# Patient Record
Sex: Female | Born: 1950 | Race: White | Hispanic: No | Marital: Married | Smoking: Never smoker
Health system: Southern US, Community
[De-identification: ages and names within clinical notes are randomized; demographics above are authoritative.]

## PROBLEM LIST (undated history)

## (undated) DIAGNOSIS — H269 Unspecified cataract: Secondary | ICD-10-CM

## (undated) DIAGNOSIS — C50919 Malignant neoplasm of unspecified site of unspecified female breast: Secondary | ICD-10-CM

## (undated) DIAGNOSIS — C801 Malignant (primary) neoplasm, unspecified: Secondary | ICD-10-CM

## (undated) DIAGNOSIS — E785 Hyperlipidemia, unspecified: Secondary | ICD-10-CM

## (undated) DIAGNOSIS — K5792 Diverticulitis of intestine, part unspecified, without perforation or abscess without bleeding: Secondary | ICD-10-CM

## (undated) DIAGNOSIS — I499 Cardiac arrhythmia, unspecified: Secondary | ICD-10-CM

## (undated) DIAGNOSIS — R0600 Dyspnea, unspecified: Secondary | ICD-10-CM

## (undated) DIAGNOSIS — R011 Cardiac murmur, unspecified: Secondary | ICD-10-CM

## (undated) HISTORY — PX: INCONTINENCE SURGERY: SHX676

## (undated) HISTORY — DX: Cardiac arrhythmia, unspecified: I49.9

## (undated) HISTORY — PX: EYE SURGERY: SHX253

## (undated) HISTORY — DX: Unspecified cataract: H26.9

## (undated) HISTORY — PX: TONSILLECTOMY: SUR1361

## (undated) HISTORY — PX: COLON RESECTION SIGMOID: SHX6737

## (undated) HISTORY — DX: Diverticulitis of intestine, part unspecified, without perforation or abscess without bleeding: K57.92

## (undated) HISTORY — DX: Cardiac murmur, unspecified: R01.1

## (undated) HISTORY — PX: ABDOMINAL HYSTERECTOMY: SHX81

## (undated) HISTORY — DX: Malignant (primary) neoplasm, unspecified: C80.1

## (undated) HISTORY — PX: COLON SURGERY: SHX602

## (undated) HISTORY — DX: Malignant neoplasm of unspecified site of unspecified female breast: C50.919

## (undated) HISTORY — DX: Hyperlipidemia, unspecified: E78.5

---

## 2011-05-01 ENCOUNTER — Emergency Department (INDEPENDENT_AMBULATORY_CARE_PROVIDER_SITE_OTHER)
Admission: EM | Admit: 2011-05-01 | Discharge: 2011-05-01 | Disposition: A | Discharge status: Home / Self Care | Payer: BC Managed Care – PPO | Source: Home / Self Care | Attending: Emergency Medicine | Admitting: Emergency Medicine

## 2011-05-01 ENCOUNTER — Encounter (HOSPITAL_COMMUNITY): Payer: Self-pay | Admitting: Emergency Medicine

## 2011-05-01 DIAGNOSIS — N390 Urinary tract infection, site not specified: Secondary | ICD-10-CM

## 2011-05-01 LAB — POCT URINALYSIS DIP (DEVICE)
Bilirubin Urine: NEGATIVE
Glucose, UA: NEGATIVE mg/dL
Nitrite: NEGATIVE
Protein, ur: 30 mg/dL — AB
Specific Gravity, Urine: 1.025 (ref 1.005–1.030)
Urobilinogen, UA: 0.2 mg/dL (ref 0.0–1.0)
pH: 5.5 (ref 5.0–8.0)

## 2011-05-01 MED ORDER — PHENAZOPYRIDINE HCL 200 MG PO TABS: 200.0000 mg | ORAL_TABLET | Freq: Three times a day (TID) | ORAL | Status: AC | PRN

## 2011-05-01 MED ORDER — SULFAMETHOXAZOLE-TRIMETHOPRIM 800-160 MG PO TABS: 1.0000 | ORAL_TABLET | Freq: Two times a day (BID) | ORAL | Status: AC

## 2011-05-01 NOTE — ED Notes (Signed)
2 day hx of urinary difficulty

## 2011-05-01 NOTE — ED Provider Notes (Signed)
History     CSN: [id number]  Arrival date & time 05/01/11  1955   First MD Initiated Contact with Patient 05/01/11 1958      Chief Complaint  Patient presents with  . Dysuria    (Consider location/radiation/quality/duration/timing/severity/associated sxs/prior treatment) Patient is a 60 y.o. female presenting with dysuria. The history is provided by the patient.  Dysuria  This is a new problem. The current episode started 2 days ago. The problem occurs every urination. The problem has not changed since onset.The quality of the pain is described as burning and aching. There has been no fever. She is not sexually active. There is no history of pyelonephritis. Associated symptoms include nausea, frequency and urgency. Pertinent negatives include no chills, no sweats, no vomiting, no discharge, no hematuria, no hesitancy and no flank pain. She has tried increased fluids (cranberry pills) for the symptoms. Her past medical history is significant for recurrent UTIs. Her past medical history does not include kidney stones.    Past Medical History  Diagnosis Date  . Ulcerative colitis   . Osteoporosis     Past Surgical History  Procedure Date  . Tonsillectomy   . Incontinence surgery     History reviewed. No pertinent family history.  History  Substance Use Topics  . Smoking status: Never Smoker   . Smokeless tobacco: Not on file  . Alcohol Use: No    OB History    Grav Para Term Preterm Abortions TAB SAB Ect Mult Living                  Review of Systems  Constitutional: Negative for chills.  Gastrointestinal: Positive for nausea. Negative for vomiting.  Genitourinary: Positive for dysuria, urgency and frequency. Negative for hesitancy, hematuria and flank pain.    Allergies  Penicillins  Home Medications   Current Outpatient Rx  Name Route Sig Dispense Refill  . BONIVA IV Intravenous Inject into the vein.      Marland Kitchen MESALAMINE 400 MG PO TBEC Oral Take 400 mg by  mouth 3 (three) times daily.      Marland Kitchen SOLIFENACIN SUCCINATE 10 MG PO TABS Oral Take 5 mg by mouth daily.      Marland Kitchen PHENAZOPYRIDINE HCL 200 MG PO TABS Oral Take 1 tablet (200 mg total) by mouth 3 (three) times daily as needed for pain. 6 tablet 0  . SULFAMETHOXAZOLE-TRIMETHOPRIM 800-160 MG PO TABS Oral Take 1 tablet by mouth 2 (two) times daily. 6 tablet 0    BP 126/72  Pulse 70  Temp(Src) 98.5 F (36.9 C) (Oral)  Resp 18  SpO2 100%  Physical Exam  Nursing note and vitals reviewed. Constitutional: She is oriented to person, place, and time. She appears well-developed and well-nourished. No distress.  HENT:  Head: Normocephalic and atraumatic.  Eyes: EOM are normal. Pupils are equal, round, and reactive to light.  Neck: Normal range of motion.  Cardiovascular: Regular rhythm.   Pulmonary/Chest: Effort normal and breath sounds normal.  Abdominal: Soft. Bowel sounds are normal. She exhibits no distension. There is tenderness in the suprapubic area. There is no rigidity, no rebound, no guarding and no CVA tenderness.  Musculoskeletal: Normal range of motion.  Neurological: She is alert and oriented to person, place, and time.  Skin: Skin is warm and dry.  Psychiatric: She has a normal mood and affect. Her behavior is normal. Judgment and thought content normal.    ED Course  Procedures (including critical care time)  Labs Reviewed  POCT URINALYSIS DIP (DEVICE) - Abnormal; Notable for the following:    Ketones, ur TRACE (*)    Hgb urine dipstick MODERATE (*)    Protein, ur 30 (*)    Leukocytes, UA MODERATE (*) Biochemical Testing Only. Please order routine urinalysis from main lab if confirmatory testing is needed.   All other components within normal limits  POCT URINALYSIS DIPSTICK   No results found.   1. UTI (lower urinary tract infection)       MDM    Luiz Blare, MD 05/01/11 2056

## 2011-05-03 NOTE — ED Notes (Signed)
Pt. and her husband called for her urine culture result and asking if she needs more medicine?  I did not see a result. Discussed with Dr. Chaney Malling. She said it was 9:00 PM and she may have forgot to order the test.  She said if pt. is better, no more medicine is needed. If not better, she needs to see her PCP for a repeat U/A and urine culture.  I gave this information to them.  Husband was not happy with this.  I explained that the doctor forgot to order the test as she had discussed with them.  I repeated her instructions.   Vassie Moselle 05/03/2011

## 2015-12-27 DIAGNOSIS — L821 Other seborrheic keratosis: Secondary | ICD-10-CM | POA: Diagnosis not present

## 2015-12-27 DIAGNOSIS — I8310 Varicose veins of unspecified lower extremity with inflammation: Secondary | ICD-10-CM | POA: Diagnosis not present

## 2015-12-27 DIAGNOSIS — Z6829 Body mass index (BMI) 29.0-29.9, adult: Secondary | ICD-10-CM | POA: Diagnosis not present

## 2016-02-01 DIAGNOSIS — Z794 Long term (current) use of insulin: Secondary | ICD-10-CM | POA: Diagnosis not present

## 2016-02-01 DIAGNOSIS — Z6836 Body mass index (BMI) 36.0-36.9, adult: Secondary | ICD-10-CM | POA: Diagnosis not present

## 2016-02-01 DIAGNOSIS — Z1159 Encounter for screening for other viral diseases: Secondary | ICD-10-CM | POA: Diagnosis not present

## 2016-02-01 DIAGNOSIS — E8881 Metabolic syndrome: Secondary | ICD-10-CM | POA: Diagnosis not present

## 2016-02-01 DIAGNOSIS — G5602 Carpal tunnel syndrome, left upper limb: Secondary | ICD-10-CM | POA: Diagnosis not present

## 2016-02-01 DIAGNOSIS — Z125 Encounter for screening for malignant neoplasm of prostate: Secondary | ICD-10-CM | POA: Diagnosis not present

## 2016-02-01 DIAGNOSIS — E785 Hyperlipidemia, unspecified: Secondary | ICD-10-CM | POA: Diagnosis not present

## 2016-02-01 DIAGNOSIS — N182 Chronic kidney disease, stage 2 (mild): Secondary | ICD-10-CM | POA: Diagnosis not present

## 2016-02-01 DIAGNOSIS — I8390 Asymptomatic varicose veins of unspecified lower extremity: Secondary | ICD-10-CM | POA: Diagnosis not present

## 2016-02-01 DIAGNOSIS — E1122 Type 2 diabetes mellitus with diabetic chronic kidney disease: Secondary | ICD-10-CM | POA: Diagnosis not present

## 2016-02-01 DIAGNOSIS — I10 Essential (primary) hypertension: Secondary | ICD-10-CM | POA: Diagnosis not present

## 2016-02-07 DIAGNOSIS — K5190 Ulcerative colitis, unspecified, without complications: Secondary | ICD-10-CM | POA: Diagnosis not present

## 2016-02-25 DIAGNOSIS — J40 Bronchitis, not specified as acute or chronic: Secondary | ICD-10-CM | POA: Diagnosis not present

## 2016-02-25 DIAGNOSIS — Z6829 Body mass index (BMI) 29.0-29.9, adult: Secondary | ICD-10-CM | POA: Diagnosis not present

## 2016-03-03 DIAGNOSIS — K5190 Ulcerative colitis, unspecified, without complications: Secondary | ICD-10-CM | POA: Diagnosis not present

## 2016-03-03 DIAGNOSIS — Z6829 Body mass index (BMI) 29.0-29.9, adult: Secondary | ICD-10-CM | POA: Diagnosis not present

## 2016-03-03 DIAGNOSIS — M810 Age-related osteoporosis without current pathological fracture: Secondary | ICD-10-CM | POA: Diagnosis not present

## 2016-03-03 DIAGNOSIS — Z90710 Acquired absence of both cervix and uterus: Secondary | ICD-10-CM | POA: Diagnosis not present

## 2016-03-03 DIAGNOSIS — Z9089 Acquired absence of other organs: Secondary | ICD-10-CM | POA: Diagnosis not present

## 2016-03-03 DIAGNOSIS — R32 Unspecified urinary incontinence: Secondary | ICD-10-CM | POA: Diagnosis not present

## 2016-03-03 DIAGNOSIS — Z66 Do not resuscitate: Secondary | ICD-10-CM | POA: Diagnosis not present

## 2016-03-03 DIAGNOSIS — Q851 Tuberous sclerosis: Secondary | ICD-10-CM | POA: Diagnosis not present

## 2016-04-12 DIAGNOSIS — M79604 Pain in right leg: Secondary | ICD-10-CM | POA: Diagnosis not present

## 2016-04-12 DIAGNOSIS — R938 Abnormal findings on diagnostic imaging of other specified body structures: Secondary | ICD-10-CM | POA: Diagnosis not present

## 2016-04-13 DIAGNOSIS — Z1231 Encounter for screening mammogram for malignant neoplasm of breast: Secondary | ICD-10-CM | POA: Diagnosis not present

## 2016-11-16 DIAGNOSIS — Z86010 Personal history of colonic polyps: Secondary | ICD-10-CM | POA: Diagnosis not present

## 2016-11-16 DIAGNOSIS — K5100 Ulcerative (chronic) pancolitis without complications: Secondary | ICD-10-CM | POA: Diagnosis not present

## 2017-07-17 ENCOUNTER — Encounter: Payer: Self-pay | Admitting: Family Medicine

## 2017-07-17 ENCOUNTER — Ambulatory Visit: Payer: Medicare HMO | Admitting: Family Medicine

## 2017-07-17 VITALS — BP 120/70 | HR 80 | Temp 98.0°F | Resp 16 | Ht 64.75 in | Wt 180.3 lb

## 2017-07-17 DIAGNOSIS — R198 Other specified symptoms and signs involving the digestive system and abdomen: Secondary | ICD-10-CM

## 2017-07-17 DIAGNOSIS — E669 Obesity, unspecified: Secondary | ICD-10-CM | POA: Diagnosis not present

## 2017-07-17 DIAGNOSIS — Z1239 Encounter for other screening for malignant neoplasm of breast: Secondary | ICD-10-CM

## 2017-07-17 DIAGNOSIS — M810 Age-related osteoporosis without current pathological fracture: Secondary | ICD-10-CM | POA: Diagnosis not present

## 2017-07-17 DIAGNOSIS — R69 Illness, unspecified: Secondary | ICD-10-CM | POA: Diagnosis not present

## 2017-07-17 DIAGNOSIS — Z1159 Encounter for screening for other viral diseases: Secondary | ICD-10-CM | POA: Diagnosis not present

## 2017-07-17 DIAGNOSIS — K51311 Ulcerative (chronic) rectosigmoiditis with rectal bleeding: Secondary | ICD-10-CM

## 2017-07-17 DIAGNOSIS — R32 Unspecified urinary incontinence: Secondary | ICD-10-CM

## 2017-07-17 DIAGNOSIS — Z1231 Encounter for screening mammogram for malignant neoplasm of breast: Secondary | ICD-10-CM | POA: Diagnosis not present

## 2017-07-17 DIAGNOSIS — I8310 Varicose veins of unspecified lower extremity with inflammation: Secondary | ICD-10-CM | POA: Insufficient documentation

## 2017-07-17 DIAGNOSIS — F458 Other somatoform disorders: Secondary | ICD-10-CM | POA: Diagnosis not present

## 2017-07-17 DIAGNOSIS — R0989 Other specified symptoms and signs involving the circulatory and respiratory systems: Secondary | ICD-10-CM

## 2017-07-17 MED ORDER — CLOBETASOL PROPIONATE 0.05 % EX CREA: 1.0000 "application " | TOPICAL_CREAM | Freq: Every day | CUTANEOUS | 5 refills | Status: DC

## 2017-07-17 NOTE — Assessment & Plan Note (Signed)
Diagnosed by dermatologist; will continue the clobetasol

## 2017-07-17 NOTE — Assessment & Plan Note (Signed)
Managed by GI; reviewed last labs from July; stable, chronic condition, continue medicine

## 2017-07-17 NOTE — Assessment & Plan Note (Signed)
Order DEXA; adequate calcium and vitamin D; will consider treatment after seeing scan results; she'll increase activity level

## 2017-07-17 NOTE — Assessment & Plan Note (Signed)
Offered information

## 2017-07-17 NOTE — Patient Instructions (Addendum)
Please request records from prior doctor: immunizations, important imaging studies in the last 5 years, labs from the last 3 years, problem list, and medicine list  Let me know in two weeks if symptoms (throat) persist Try honey and lemon and tea and hydration and salt water gargles  Please do call to schedule your bone density study; the number to schedule one at either [facility] or [facility] is [phone] or [phone]  Check out the information at [email or web address].org entitled "Nutrition for Weight Loss: What You Need to Know about Fad Diets" Try to lose between 1-2 pounds per week by taking in fewer calories and burning off more calories You can succeed by limiting portions, limiting foods dense in calories and fat, becoming more active, and drinking 8 glasses of water a day (64 ounces) Don't skip meals, especially breakfast, as skipping meals may alter your metabolism Do not use over-the-counter weight loss pills or gimmicks that claim rapid weight loss A healthy BMI (or body mass index) is between 18.5 and 24.9 You can calculate your ideal BMI at the [place] website ClubMonetize.fr   Fall Prevention in the Home Falls can cause injuries. They can happen to people of all ages. There are many things you can do to make your home safe and to help prevent falls. What can I do on the outside of my home?  Regularly fix the edges of walkways and driveways and fix any cracks.  Remove anything that might make you trip as you walk through a door, such as a raised step or threshold.  Trim any bushes or trees on the path to your home.  Use bright outdoor lighting.  Clear any walking paths of anything that might make someone trip, such as rocks or tools.  Regularly check to see if handrails are loose or broken. Make sure that both sides of any steps have handrails.  Any raised decks and porches should have  guardrails on the edges.  Have any leaves, snow, or ice cleared regularly.  Use sand or salt on walking paths during winter.  Clean up any spills in your garage right away. This includes oil or grease spills. What can I do in the bathroom?  Use night lights.  Install grab bars by the toilet and in the tub and shower. Do not use towel bars as grab bars.  Use non-skid mats or decals in the tub or shower.  If you need to sit down in the shower, use a plastic, non-slip stool.  Keep the floor dry. Clean up any water that spills on the floor as soon as it happens.  Remove soap buildup in the tub or shower regularly.  Attach bath mats securely with double-sided non-slip rug tape.  Do not have throw rugs and other things on the floor that can make you trip. What can I do in the bedroom?  Use night lights.  Make sure that you have a light by your bed that is easy to reach.  Do not use any sheets or blankets that are too big for your bed. They should not hang down onto the floor.  Have a firm chair that has side arms. You can use this for support while you get dressed.  Do not have throw rugs and other things on the floor that can make you trip. What can I do in the kitchen?  Clean up any spills right away.  Avoid walking on wet floors.  Keep items that you  use a lot in easy-to-reach places.  If you need to reach something above you, use a strong step stool that has a grab bar.  Keep electrical cords out of the way.  Do not use floor polish or wax that makes floors slippery. If you must use wax, use non-skid floor wax.  Do not have throw rugs and other things on the floor that can make you trip. What can I do with my stairs?  Do not leave any items on the stairs.  Make sure that there are handrails on both sides of the stairs and use them. Fix handrails that are broken or loose. Make sure that handrails are as long as the stairways.  Check any carpeting to make sure that  it is firmly attached to the stairs. Fix any carpet that is loose or worn.  Avoid having throw rugs at the top or bottom of the stairs. If you do have throw rugs, attach them to the floor with carpet tape.  Make sure that you have a light switch at the top of the stairs and the bottom of the stairs. If you do not have them, ask someone to add them for you. What else can I do to help prevent falls?  Wear shoes that: ? Do not have high heels. ? Have rubber bottoms. ? Are comfortable and fit you well. ? Are closed at the toe. Do not wear sandals.  If you use a stepladder: ? Make sure that it is fully opened. Do not climb a closed stepladder. ? Make sure that both sides of the stepladder are locked into place. ? Ask someone to hold it for you, if possible.  Clearly mark and make sure that you can see: ? Any grab bars or handrails. ? First and last steps. ? Where the edge of each step is.  Use tools that help you move around (mobility aids) if they are needed. These include: ? Canes. ? Walkers. ? Scooters. ? Crutches.  Turn on the lights when you go into a dark area. Replace any light bulbs as soon as they burn out.  Set up your furniture so you have a clear path. Avoid moving your furniture around.  If any of your floors are uneven, fix them.  If there are any pets around you, be aware of where they are.  Review your medicines with your doctor. Some medicines can make you feel dizzy. This can increase your chance of falling. Ask your doctor what other things that you can do to help prevent falls. This information is not intended to replace advice given to you by your health care provider. Make sure you discuss any questions you have with your health care provider. Document Released: 02/11/2009 Document Revised: 09/23/2015 Document Reviewed: 05/22/2014 Elsevier Interactive Patient Education  2018 [place].  Preventing Unhealthy [employer], Adult Staying at a healthy  weight is important. When fat builds up in your body, you may become overweight or obese. These conditions put you at greater risk for developing certain health problems, such as heart disease, diabetes, sleeping problems, joint problems, and some cancers. Unhealthy weight gain is often the result of making unhealthy choices in what you eat. It is also a result of not getting enough exercise. You can make changes to your lifestyle to prevent obesity and stay as healthy as possible. What nutrition changes can be made? To maintain a healthy weight and prevent obesity:  Eat only as much as your body needs. To  do this: ? Pay attention to signs that you are hungry or full. Stop eating as soon as you feel full. ? If you feel hungry, try drinking water first. Drink enough water so your urine is clear or pale yellow. ? Eat smaller portions. ? Look at serving sizes on food labels. Most foods contain more than one serving per container. ? Eat the recommended amount of calories for your gender and activity level. While most active people should eat around 2,000 calories per day, if you are trying to lose weight or are not very active, you main need to eat less calories. Talk to your health care provider or dietitian about how many calories you should eat each day.  Choose healthy foods, such as: ? Fruits and vegetables. Try to fill at least half of your plate at each meal with fruits and vegetables. ? Whole grains, such as whole wheat bread, brown rice, and quinoa. ? Lean meats, such as chicken or fish. ? Other healthy proteins, such as beans, eggs, or tofu. ? Healthy fats, such as nuts, seeds, fatty fish, and olive oil. ? Low-fat or fat-free dairy.  Check food labels and avoid food and drinks that: ? Are high in calories. ? Have added sugar. ? Are high in sodium. ? Have saturated fats or trans fats.  Limit how much you eat of the following foods: ? Prepackaged meals. ? Fast food. ? Fried  foods. ? Processed meat, such as bacon, sausage, and deli meats. ? Fatty cuts of red meat and poultry with skin.  Cook foods in healthier ways, such as by baking, broiling, or grilling.  When grocery shopping, try to shop around the outside of the store. This helps you buy mostly fresh foods and avoid canned and prepackaged foods.  What lifestyle changes can be made?  Exercise at least 30 minutes 5 or more days each week. Exercising includes brisk walking, yard work, biking, running, swimming, and team sports like basketball and soccer. Ask your health care provider which exercises are safe for you.  Do not use any products that contain nicotine or tobacco, such as cigarettes and e-cigarettes. If you need help quitting, ask your health care provider.  Limit alcohol intake to no more than 1 drink a day for nonpregnant women and 2 drinks a day for men. One drink equals 12 oz of beer, 5 oz of wine, or 1 oz of hard liquor.  Try to get 7-9 hours of sleep each night. What other changes can be made?  Keep a food and activity journal to keep track of: ? What you ate and how many calories you had. Remember to count sauces, dressings, and side dishes. ? Whether you were active, and what exercises you did. ? Your calorie, weight, and activity goals.  Check your weight regularly. Track any changes. If you notice you have gained weight, make changes to your diet or activity routine.  Avoid taking weight-loss medicines or supplements. Talk to your health care provider before starting any new medicine or supplement.  Talk to your health care provider before trying any new diet or exercise plan. Why are these changes important? Eating healthy, staying active, and having healthy habits not only help prevent obesity, they also:  Help you to manage stress and emotions.  Help you to connect with friends and family.  Improve your self-esteem.  Improve your sleep.  Prevent long-term health  problems.  What can happen if changes are not made? Being obese or overweight  can cause you to develop joint or bone problems, which can make it hard for you to stay active or do activities you enjoy. Being obese or overweight also puts stress on your heart and lungs and can lead to health problems like diabetes, heart disease, and some cancers. Where to find more information: Talk with your health care provider or a dietitian about healthy eating and healthy lifestyle choices. You may also find other information through these resources:  U.S. Department of Agriculture MyPlate: FormerBoss.no  American Heart Association: www.heart.org  Centers for Disease Control and Prevention: http://www.wolf.info/  Summary  Staying at a healthy weight is important. It helps prevent certain diseases and health problems, such as heart disease, diabetes, joint problems, sleep disorders, and some cancers.  Being obese or overweight can cause you to develop joint or bone problems, which can make it hard for you to stay active or do activities you enjoy.  You can prevent unhealthy weight gain by eating a healthy diet, exercising regularly, not smoking, limiting alcohol, and getting enough sleep.  Talk with your health care provider or a dietitian for guidance about healthy eating and healthy lifestyle choices. This information is not intended to replace advice given to you by your health care provider. Make sure you discuss any questions you have with your health care provider. Document Released: 04/18/2016 Document Revised: 05/24/2016 Document Reviewed: 05/24/2016 Elsevier Interactive Patient Education  [employer].

## 2017-07-17 NOTE — Progress Notes (Signed)
BP 120/70   Pulse 80   Temp 98 F (36.7 C) (Oral)   Resp 16   Ht 5' 4.75" (1.645 m)   Wt 180 lb 4.8 oz (81.8 kg)   SpO2 97%   BMI 30.24 kg/m    Subjective:    Patient ID: Shirley Lowe, female    DOB: 05-31-1950, 67 y.o.   MRN: [id number]  HPI: Shirley Lowe is a 67 y.o. female  Chief Complaint  Patient presents with  . Establish Care    HPI Patient is here to establish care; from [place] She was sick in January and February; five weeks of cough, and since then has had a lump inside the throat; feels like a something, like sinus drainage, something there; not as bad when sitting up or walking; more noticeable when laying recumbent; not a smoker or smokeless tobacco; little bit of sore throat, but not even then, just a cough, bronchial; bringing up a little bit; always had a lot of sinus drainage; blows her nose and hacks a lot; having a little sinus pressure, but not now; did have a fever and sick in bed for a few days, just did too much; a few days, but resolved; took OTC stuff and that calmed it; chokes a lot now, going on for a little while, even before the sickness; has a small esophagus; she has had food go down the wrong way but can cough it out; feels a little weak all over; not much muscle strength, not an exerciser, no loss of muscle; no loss of weight or night sweats  Osteoporosis; took fosamax first, then Boniva but that's been several years; she took fosamax for about 3 years; does eat a lot of dark green veggies  She does not take the asacol any more; using mesalimine for UC; sees, Dr. Lizbeth Bark at [facility]; labs done in July 2018 looked fabulous; glucose 80  She has discoloration and hardness over the medial left leg; never thought it was cancer; saw dermatologist; had a doppler on both legs, had stress test and other testing; clobetasol is helping; not as red  No high cholesterol; tested in the fall in 2016; has always had good HDL and low  LDL  Does have to wear pads all the time; there is an OTC med called AZO; wondering about taking it for spasms  Depression screen [facility] 2/9 07/17/2017  Decreased Interest 0  Down, Depressed, Hopeless 0  PHQ - 2 Score 0   Relevant past medical, surgical, family and social history reviewed Past Medical History:  Diagnosis Date  . Osteoporosis   . Ulcerative colitis    Past Surgical History:  Procedure Laterality Date  . ABDOMINAL HYSTERECTOMY    . INCONTINENCE SURGERY    . TONSILLECTOMY     Family History  Problem Relation Age of Onset  . Cancer Mother        breast cancer  . Cancer Father        pancreatic  . Hepatitis C Sister   . Thyroid disease Sister   . Irritable bowel syndrome Sister   . Allergies Sister    Social History   Tobacco Use  . Smoking status: Never Smoker  . Smokeless tobacco: Never Used  Substance Use Topics  . Alcohol use: No  . Drug use: No   Interim medical history since last visit reviewed. Allergies and medications reviewed  Review of Systems  Constitutional: Negative for diaphoresis and unexpected weight change.  Gastrointestinal: Negative  for anal bleeding and blood in stool.       No blood in stool, UC is in remission  Musculoskeletal: Negative for arthralgias.   Per HPI unless specifically indicated above     Objective:    BP 120/70   Pulse 80   Temp 98 F (36.7 C) (Oral)   Resp 16   Ht 5' 4.75" (1.645 m)   Wt 180 lb 4.8 oz (81.8 kg)   SpO2 97%   BMI 30.24 kg/m   Wt Readings from Last 3 Encounters:  07/17/17 180 lb 4.8 oz (81.8 kg)    Physical Exam  Constitutional: She appears well-developed and well-nourished. No distress.  HENT:  Head: Normocephalic and atraumatic.  Right Ear: Tympanic membrane and ear canal normal.  Left Ear: Tympanic membrane and ear canal normal.  Nose: No rhinorrhea.  Mouth/Throat: Oropharynx is clear and moist and mucous membranes are normal. No posterior oropharyngeal edema or posterior  oropharyngeal erythema.  Eyes: EOM are normal. No scleral icterus.  Neck: No thyroid mass and no thyromegaly present.  Thyroid mobile and nontender  Cardiovascular: Normal rate, regular rhythm and normal heart sounds.  No murmur heard. Pulmonary/Chest: Effort normal and breath sounds normal. No respiratory distress. She has no wheezes.  Abdominal: Soft. Bowel sounds are normal. She exhibits no distension.  Musculoskeletal: Normal range of motion. She exhibits no edema.       Thoracic back: She exhibits no deformity.  No thoracic deformity  Lymphadenopathy:       Head (right side): No submental and no submandibular adenopathy present.       Head (left side): No submental and no submandibular adenopathy present.    She has no cervical adenopathy.       Right cervical: No posterior cervical adenopathy present.      Left cervical: No posterior cervical adenopathy present.  Neurological: She is alert. She exhibits normal muscle tone.  Skin: She is not diaphoretic. No pallor.     Area on the anteromedial  LEFT leg has erythema and firmness  Psychiatric: She has a normal mood and affect. Her behavior is normal. Judgment and thought content normal. Her mood appears not anxious. She does not exhibit a depressed mood.    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[place])    Managed by GI; reviewed last labs  from July; stable, chronic condition, continue medicine        Musculoskeletal and Integument   Osteoporosis - Primary    Order DEXA; adequate calcium and vitamin D; will consider treatment after seeing scan results; she'll increase activity level      Relevant Medications   Calcium Carbonate-Vitamin D (CALTRATE 600+D PO)   Lipodermatosclerosis    Diagnosed by dermatologist; will continue the clobetasol        Other  Urinary incontinence in female    Consider avoiding tea and coffee and chocolate      Obesity (BMI 30-39.9)    Offered information       Other Visit Diagnoses    Encounter for hepatitis C screening test for low risk patient       Relevant Orders   Hepatitis C Antibody   Screening for breast cancer       Relevant Orders   MM DIAG BREAST TOMO BILATERAL   Globus sensation       discussed options, seeing ENT now or waiting another few weeks with conservative measures; she'll wait and call me if not better in 2 weeks       Follow up plan: Return in about 6 weeks (around 08/28/2017) for Medicare Wellness check.  An after-visit summary was printed and given to the patient at [facility].  Please see the patient instructions which may contain other information and recommendations beyond what is mentioned above in the assessment and plan.  Meds ordered this encounter  Medications  . clobetasol cream (TEMOVATE) 0.05 %    Sig: Apply 1 application topically at bedtime.    Dispense:  30 g    Refill:  5    Orders Placed This Encounter  Procedures  . MM DIAG BREAST TOMO BILATERAL  . Hepatitis C Antibody

## 2017-07-17 NOTE — Assessment & Plan Note (Signed)
Consider avoiding tea and coffee and chocolate

## 2017-07-18 ENCOUNTER — Encounter: Payer: Self-pay | Admitting: Family Medicine

## 2017-07-27 DIAGNOSIS — J019 Acute sinusitis, unspecified: Secondary | ICD-10-CM | POA: Diagnosis not present

## 2017-07-27 DIAGNOSIS — J040 Acute laryngitis: Secondary | ICD-10-CM | POA: Diagnosis not present

## 2017-09-13 ENCOUNTER — Ambulatory Visit (INDEPENDENT_AMBULATORY_CARE_PROVIDER_SITE_OTHER): Payer: Medicare HMO

## 2017-09-13 ENCOUNTER — Encounter: Payer: Medicare HMO | Admitting: Family Medicine

## 2017-09-13 VITALS — BP 118/60 | HR 60 | Temp 98.3°F | Resp 12 | Ht 65.0 in | Wt 181.4 lb

## 2017-09-13 DIAGNOSIS — Z0000 Encounter for general adult medical examination without abnormal findings: Secondary | ICD-10-CM | POA: Diagnosis not present

## 2017-09-13 DIAGNOSIS — Z1159 Encounter for screening for other viral diseases: Secondary | ICD-10-CM

## 2017-09-13 NOTE — Progress Notes (Signed)
Subjective:   Shirley Lowe is a 67 y.o. female who presents for an Initial Medicare Annual Wellness Visit.  Review of Systems    N/A  Cardiac Risk Factors include: advanced age (>33men, >20 women);obesity (BMI >30kg/m2);sedentary lifestyle     Objective:    Today's Vitals   09/13/17 1320  BP: 118/60  Pulse: 60  Resp: 12  Temp: 98.3 F (36.8 C)  TempSrc: Oral  SpO2: 93%  Weight: 181 lb 6.4 oz (82.3 kg)  Height: 5\' 5"  (1.651 m)   Body mass index is 30.19 kg/m.  Advanced Directives 09/13/2017  Does Patient Have a Medical Advance Directive? Yes  Type of Paramedic of [place];Living will  Copy of [place] in Chart? No - copy requested    Current Medications (verified) Outpatient Encounter Medications as of 09/13/2017  Medication Sig  . Ascorbic Acid (VITAMIN C) 1000 MG tablet Take 1,000 mg by mouth daily.  Marland Kitchen b complex vitamins tablet Take 1 tablet by mouth daily.  . Calcium Carbonate-Vitamin D (CALTRATE 600+D PO) Take 2 tablets by mouth daily.  . clobetasol cream (TEMOVATE) 8.92 % Apply 1 application topically at bedtime.  . mesalamine (LIALDA) 1.2 g EC tablet Take 2.4 g by mouth daily.  . Omega-3 Fatty Acids (FISH OIL) 1000 MG CAPS Take 1 capsule by mouth daily.  . vitamin E 100 UNIT capsule Take 100 Units by mouth daily.   No facility-administered encounter medications on file as of 09/13/2017.     Allergies (verified) Penicillins   Hospitalizations/ED visits and surgeries occurring within the previous 12 months:  Within the previous 12 months, pt has not underwent any surgical procedures, has not been hospitalized for any conditions and has not been treated by an emergency room clinician.  History: Past Medical History:  Diagnosis Date  . Osteoporosis   . Ulcerative colitis    Past Surgical History:  Procedure Laterality Date  . ABDOMINAL HYSTERECTOMY    . INCONTINENCE SURGERY    . TONSILLECTOMY      Family History  Problem Relation Age of Onset  . Cancer Mother        breast cancer  . Cancer Father        pancreatic  . Hepatitis C Sister   . Thyroid disease Sister   . Irritable bowel syndrome Sister   . Allergies Sister    Social History   Socioeconomic History  . Marital status: Married    Spouse name: [name]  . Number of children: 3  . Years of education: Not on file  . Highest education level: Bachelor's degree (e.g., BA, AB, BS)  Occupational History  . Occupation: Retired  Scientific laboratory technician  . Financial resource strain: Not hard at all  . Food insecurity:    Worry: Never true    Inability: Never true  . Transportation needs:    Medical: No    Non-medical: No  Tobacco Use  . Smoking status: Never Smoker  . Smokeless tobacco: Never Used  . Tobacco comment: smoking cessation materials not required  Substance and Sexual Activity  . Alcohol use: No  . Drug use: No  . Sexual activity: Not Currently  Lifestyle  . Physical activity:    Days per week: 0 days    Minutes per session: 0 min  . Stress: Not at all  Relationships  . Social connections:    Talks on phone: Patient refused    Gets together: Patient refused    Attends  religious service: Patient refused    Active member of club or organization: Patient refused    Attends meetings of clubs or organizations: Patient refused    Relationship status: Married  Other Topics Concern  . Not on file  Social History Narrative  . Not on file    Tobacco Counseling Counseling given: No Comment: smoking cessation materials not required  Clinical Intake:  Pre-visit preparation completed: Yes  Pain : No/denies pain   BMI - recorded: 30.19 Nutritional Status: BMI > 30  Obese Nutritional Risks: None Diabetes: No  How often do you need to have someone help you when you read instructions, pamphlets, or other written materials from your doctor or pharmacy?: 1 - Never  Interpreter Needed?: No  Information  entered by :: AE[place], LPN   Activities of Daily Living In your present state of health, do you have any difficulty performing the following activities: 09/13/2017 07/17/2017  Hearing? N Y  Comment denies hearing aids -  Vision? N N  Comment wears eyeglasses -  Difficulty concentrating or making decisions? N N  Walking or climbing stairs? Y N  Comment dyspena -  Dressing or bathing? N N  Doing errands, shopping? N N  Preparing Food and eating ? N -  Comment denies dentures -  Using the Toilet? N -  In the past six months, have you accidently leaked urine? Y -  Comment urgency, wears pads -  Do you have problems with loss of bowel control? N -  Managing your Medications? N -  Managing your Finances? N -  Housekeeping or managing your Housekeeping? N -  Some recent data might be hidden     Immunizations and Health Maintenance  There is no immunization history on file for this patient. Health Maintenance Due  Topic Date Due  . Hepatitis C Screening  04-Apr-1951  . MAMMOGRAM  05/09/1967    Patient Care Team: [name], [name], MD as PCP - General (Family Medicine) [name], MD as Consulting Physician (Gastroenterology)  Indicate any recent Medical Services you may have received from other than [facility] providers in the past year (date may be approximate).     Assessment:   This is a routine wellness examination for [place].  Hearing/Vision screen Vision Screening Comments: Sees Dr. Ellin Mayhew for annual eye exams  Dietary issues and exercise activities discussed: Current Exercise Habits: The patient does not participate in regular exercise at present, Exercise limited by: None identified  Goals    . DIET - INCREASE WATER INTAKE     Recommend to drink at least 6-8 8oz glasses of water per day.      Depression Screen PHQ 2/9 Scores 09/13/2017 07/17/2017  PHQ - 2 Score 0 0    Fall Risk Fall Risk  09/13/2017 07/17/2017  Falls in the past year? No No  Risk for fall due to :  Impaired vision -  Risk for fall due to: Comment wears glasses, early signs of cataracts -    FALL RISK PREVENTION PERTAINING TO HOME: Is your home free of loose throw rugs in walkways, pet beds, electrical cords, etc? Yes Is there adequate lighting in your home to reduce risk of falls?  Yes Are there stairs in or around your home WITH handrails? Yes  ASSISTIVE DEVICES UTILIZED TO PREVENT FALLS: Use of a cane, walker or w/c? No Grab bars in the bathroom? Yes  Shower chair or a place to sit while bathing? Yes An elevated toilet seat or a handicapped  toilet? Yes  Timed Get Up and Go Performed: Yes. Pt ambulated 10 feet within 7 sec. Gait stead-fast and without the use of an assistive device. No intervention required at this time. Fall risk prevention has been discussed.  Community Resource Referral:  [place] Referral not required at this time.  Cognitive Function:     6CIT Screen 09/13/2017  What Year? 0 points  What month? 0 points  What time? 0 points  Count back from 20 0 points  Months in reverse 0 points  Repeat phrase 0 points  Total Score 0    Screening Tests Health Maintenance  Topic Date Due  . Hepatitis C Screening  March 16, 1951  . MAMMOGRAM  05/09/1967  . TETANUS/TDAP  07/18/2018 (Originally 05/08/1968)  . PNA vac Low Risk Adult (1 of 2 - PCV13) 07/18/2018 (Originally 05/08/2014)  . INFLUENZA VACCINE  11/29/2017  . COLONOSCOPY  05/01/2025  . DEXA SCAN  Completed    Qualifies for Shingles Vaccine? Yes. Due for Shingrix. Education has been provided regarding the importance of this vaccine. Pt has been advised to call her insurance company to determine her out of pocket expense. Advised she may also receive this vaccine at her local pharmacy or Health Dept. Verbalized acceptance and understanding.  Overdue for Flu vaccine. Education provided regarding the importance of this vaccine and to receive when available. Verbalized acceptance and understanding.    Due for Pneumoccocal vaccine. Declined my offer to administer today. Education has been provided regarding the importance of this vaccine but still declined. Pt has been advised to call our office if she should change her mind and wish to receive this vaccine. Also advised she may receive this vaccine at her local pharmacy or Health Dept. Pt is aware to provide a copy of her vaccination record if she chooses to receive this vaccine at her local pharmacy. Verbalized acceptance and understanding.  Due for Tdap vaccine. Education has been provided regarding the importance of this vaccine. Pt has been advised she may receive this vaccine at her local pharmacy or Health Dept. Also advised to provide a copy of her vaccination record if she chooses to receive this vaccine at her local pharmacy. Verbalized acceptance and understanding.  Cancer Screenings: Lung: Low Dose CT Chest recommended if Age 51-80 years, 30 pack-year currently smoking OR have quit w/in 15years. Patient does not qualify. Breast: Up to date on Mammogram? No. Ordered 07/17/17 but no report found. Pt has been provided with contact information and advised to schedule her appt. Verbalized acceptance and understanding.   Up to date of Bone Density/Dexa? Yes. Completed 05/02/15. Osteoporotic screenings no longer required. Colorectal: Completed 05/02/15. Repeat every 10 years  Additional Screenings: Hepatitis C Screening: Ordered today.   Plan:  I have personally reviewed and addressed the Medicare Annual Wellness questionnaire and have noted the following in the patient's chart:  A. Medical and social history B. Use of alcohol, tobacco or illicit drugs  C. Current medications and supplements D. Functional ability and status E.  Nutritional status F.  Physical activity G. Advance directives H. List of other physicians I.  Hospitalizations, surgeries, and ER visits in previous 12 months J.  [place] such as hearing and vision  if needed, cognitive and depression L. Referrals and appointments  In addition, I have reviewed and discussed with patient certain preventive protocols, quality metrics, and best practice recommendations. A written personalized care plan for preventive services as well as general preventive health recommendations were provided to  patient.  See attached scanned questionnaire for additional information.   Signed,  Aleatha Borer, LPN Nurse Health Advisor

## 2017-09-13 NOTE — Patient Instructions (Signed)
Shirley Lowe , Thank you for taking time to come for your Medicare Wellness Visit. I appreciate your ongoing commitment to your health goals. Please review the following plan we discussed and let me know if I can assist you in the future.   Screening recommendations/referrals: Colorectal Screening: Completed 05/02/15. Repeat every 10 years Mammogram: Please call to schedule your mammogram Bone Density: Completed 05/02/15. Osteoporotic screenings no longer required Lung Cancer Screening: You do not qualify for this screening Hepatitis C Screening: Ordered today  Vision and Dental Exams: Recommended annual ophthalmology exams for early detection of glaucoma and other disorders of the eye Recommended annual dental exams for proper oral hygiene  Vaccinations: Influenza vaccine: Overdue Pneumococcal vaccine: Declined Tdap vaccine: Declined. Please call your insurance company to determine your out of pocket expense. You may also receive this vaccine at your local pharmacy or Health Dept. Shingles vaccine: Please call your insurance company to determine your out of pocket expense for the Shingrix vaccine. You may also receive this vaccine at your local pharmacy or Health Dept.  Advanced directives: Please bring a copy of your POA (Power of Attorney) and/or Living Will to your next appointment.  Conditions/risks identified: Recommend to drink at least 6-8 8oz glasses of water per day.  Next appointment: Please schedule your Annual Wellness Visit with your Nurse Health Advisor in one year.  Preventive Care 68 Years and Older, Female Preventive care refers to lifestyle choices and visits with your health care provider that can promote health and wellness. What does preventive care include?  A yearly physical exam. This is also called an annual well check.  Dental exams once or twice a year.  Routine eye exams. Ask your health care provider how often you should have your eyes  checked.  Personal lifestyle choices, including:  Daily care of your teeth and gums.  Regular physical activity.  Eating a healthy diet.  Avoiding tobacco and drug use.  Limiting alcohol use.  Practicing safe sex.  Taking low-dose aspirin every day.  Taking vitamin and mineral supplements as recommended by your health care provider. What happens during an annual well check? The services and screenings done by your health care provider during your annual well check will depend on your age, overall health, lifestyle risk factors, and family history of disease. Counseling  Your health care provider may ask you questions about your:  Alcohol use.  Tobacco use.  Drug use.  Emotional well-being.  Home and relationship well-being.  Sexual activity.  Eating habits.  History of falls.  Memory and ability to understand (cognition).  Work and work Statistician.  Reproductive health. Screening  You may have the following tests or measurements:  Height, weight, and BMI.  Blood pressure.  Lipid and cholesterol levels. These may be checked every 5 years, or more frequently if you are over 20 years old.  Skin check.  Lung cancer screening. You may have this screening every year starting at age 41 if you have a 30-pack-year history of smoking and currently smoke or have quit within the past 15 years.  Fecal occult blood test (FOBT) of the stool. You may have this test every year starting at age 40.  Flexible sigmoidoscopy or colonoscopy. You may have a sigmoidoscopy every 5 years or a colonoscopy every 10 years starting at age 54.  Hepatitis C blood test.  Hepatitis B blood test.  Sexually transmitted disease (STD) testing.  Diabetes screening. This is done by checking your blood sugar (glucose) after you  have not eaten for a while (fasting). You may have this done every 1-3 years.  Bone density scan. This is done to screen for osteoporosis. You may have this done  starting at age 66.  Mammogram. This may be done every 1-2 years. Talk to your health care provider about how often you should have regular mammograms. Talk with your health care provider about your test results, treatment options, and if necessary, the need for more tests. Vaccines  Your health care provider may recommend certain vaccines, such as:  Influenza vaccine. This is recommended every year.  Tetanus, diphtheria, and acellular pertussis (Tdap, Td) vaccine. You may need a Td booster every 10 years.  Zoster vaccine. You may need this after age 75.  Pneumococcal 13-valent conjugate (PCV13) vaccine. One dose is recommended after age 26.  Pneumococcal polysaccharide (PPSV23) vaccine. One dose is recommended after age 34. Talk to your health care provider about which screenings and vaccines you need and how often you need them. This information is not intended to replace advice given to you by your health care provider. Make sure you discuss any questions you have with your health care provider. Document Released: 05/14/2015 Document Revised: 01/05/2016 Document Reviewed: 02/16/2015 Elsevier Interactive Patient Education  2017 [place] Prevention in the Home Falls can cause injuries. They can happen to people of all ages. There are many things you can do to make your home safe and to help prevent falls. What can I do on the outside of my home?  Regularly fix the edges of walkways and driveways and fix any cracks.  Remove anything that might make you trip as you walk through a door, such as a raised step or threshold.  Trim any bushes or trees on the path to your home.  Use bright outdoor lighting.  Clear any walking paths of anything that might make someone trip, such as rocks or tools.  Regularly check to see if handrails are loose or broken. Make sure that both sides of any steps have handrails.  Any raised decks and porches should have guardrails on the  edges.  Have any leaves, snow, or ice cleared regularly.  Use sand or salt on walking paths during winter.  Clean up any spills in your garage right away. This includes oil or grease spills. What can I do in the bathroom?  Use night lights.  Install grab bars by the toilet and in the tub and shower. Do not use towel bars as grab bars.  Use non-skid mats or decals in the tub or shower.  If you need to sit down in the shower, use a plastic, non-slip stool.  Keep the floor dry. Clean up any water that spills on the floor as soon as it happens.  Remove soap buildup in the tub or shower regularly.  Attach bath mats securely with double-sided non-slip rug tape.  Do not have throw rugs and other things on the floor that can make you trip. What can I do in the bedroom?  Use night lights.  Make sure that you have a light by your bed that is easy to reach.  Do not use any sheets or blankets that are too big for your bed. They should not hang down onto the floor.  Have a firm chair that has side arms. You can use this for support while you get dressed.  Do not have throw rugs and other things on the floor that can make you trip. What  can I do in the kitchen?  Clean up any spills right away.  Avoid walking on wet floors.  Keep items that you use a lot in easy-to-reach places.  If you need to reach something above you, use a strong step stool that has a grab bar.  Keep electrical cords out of the way.  Do not use floor polish or wax that makes floors slippery. If you must use wax, use non-skid floor wax.  Do not have throw rugs and other things on the floor that can make you trip. What can I do with my stairs?  Do not leave any items on the stairs.  Make sure that there are handrails on both sides of the stairs and use them. Fix handrails that are broken or loose. Make sure that handrails are as long as the stairways.  Check any carpeting to make sure that it is firmly  attached to the stairs. Fix any carpet that is loose or worn.  Avoid having throw rugs at the top or bottom of the stairs. If you do have throw rugs, attach them to the floor with carpet tape.  Make sure that you have a light switch at the top of the stairs and the bottom of the stairs. If you do not have them, ask someone to add them for you. What else can I do to help prevent falls?  Wear shoes that:  Do not have high heels.  Have rubber bottoms.  Are comfortable and fit you well.  Are closed at the toe. Do not wear sandals.  If you use a stepladder:  Make sure that it is fully opened. Do not climb a closed stepladder.  Make sure that both sides of the stepladder are locked into place.  Ask someone to hold it for you, if possible.  Clearly mark and make sure that you can see:  Any grab bars or handrails.  First and last steps.  Where the edge of each step is.  Use tools that help you move around (mobility aids) if they are needed. These include:  Canes.  Walkers.  Scooters.  Crutches.  Turn on the lights when you go into a dark area. Replace any light bulbs as soon as they burn out.  Set up your furniture so you have a clear path. Avoid moving your furniture around.  If any of your floors are uneven, fix them.  If there are any pets around you, be aware of where they are.  Review your medicines with your doctor. Some medicines can make you feel dizzy. This can increase your chance of falling. Ask your doctor what other things that you can do to help prevent falls. This information is not intended to replace advice given to you by your health care provider. Make sure you discuss any questions you have with your health care provider. Document Released: 02/11/2009 Document Revised: 09/23/2015 Document Reviewed: 05/22/2014 Elsevier Interactive Patient Education  2017 [employer].

## 2017-09-27 ENCOUNTER — Encounter: Payer: Self-pay | Admitting: Family Medicine

## 2017-10-02 DIAGNOSIS — K5100 Ulcerative (chronic) pancolitis without complications: Secondary | ICD-10-CM | POA: Diagnosis not present

## 2017-11-19 ENCOUNTER — Other Ambulatory Visit: Payer: Self-pay | Admitting: Family Medicine

## 2017-11-19 DIAGNOSIS — Z1231 Encounter for screening mammogram for malignant neoplasm of breast: Secondary | ICD-10-CM

## 2017-12-05 ENCOUNTER — Ambulatory Visit
Admission: RE | Admit: 2017-12-05 | Discharge: 2017-12-05 | Disposition: A | Discharge status: Home / Self Care | Payer: Medicare HMO | Source: Ambulatory Visit | Attending: Family Medicine | Admitting: Family Medicine

## 2017-12-05 DIAGNOSIS — Z1231 Encounter for screening mammogram for malignant neoplasm of breast: Secondary | ICD-10-CM | POA: Diagnosis not present

## 2017-12-05 IMAGING — MG MM DIGITAL SCREENING BILAT W/ TOMO W/ CAD
6 of 12 series · 6 of 36 positions shown · non-contrast
Comparison: Previous exam(s).

CLINICAL DATA: Screening.

EXAM:
DIGITAL SCREENING BILATERAL MAMMOGRAM WITH TOMO AND CAD

[R XCCL synth-2D]
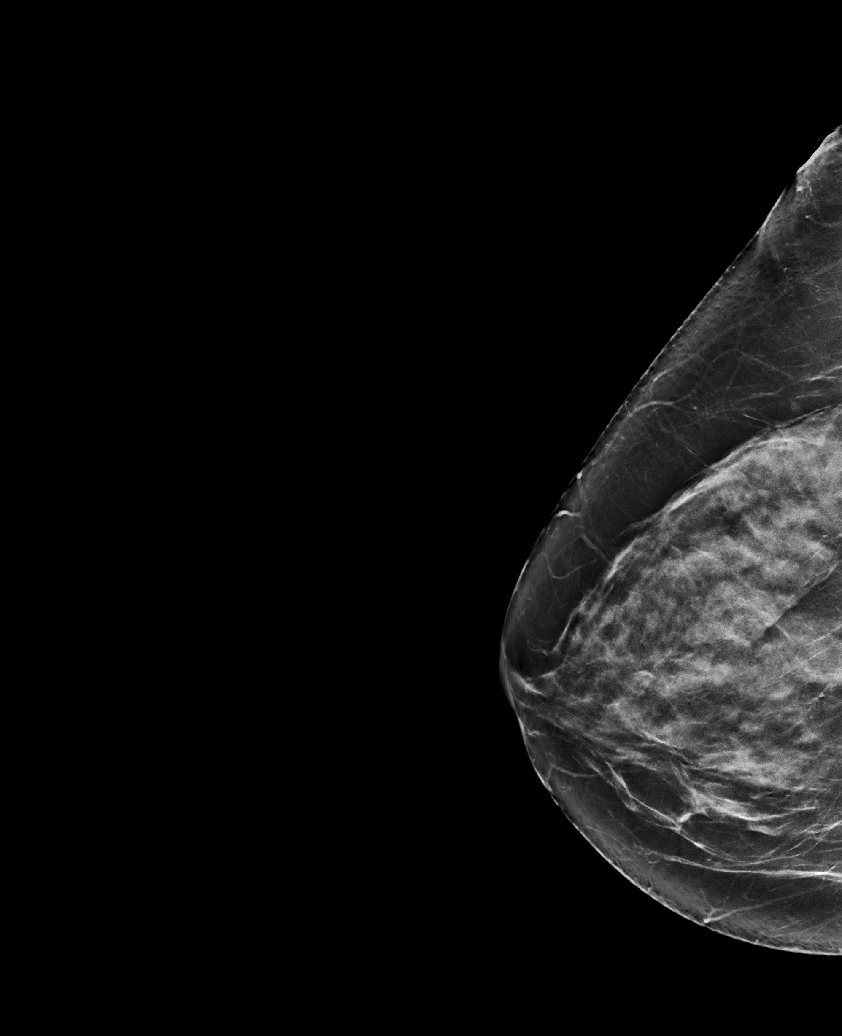

[L CC synth-2D]
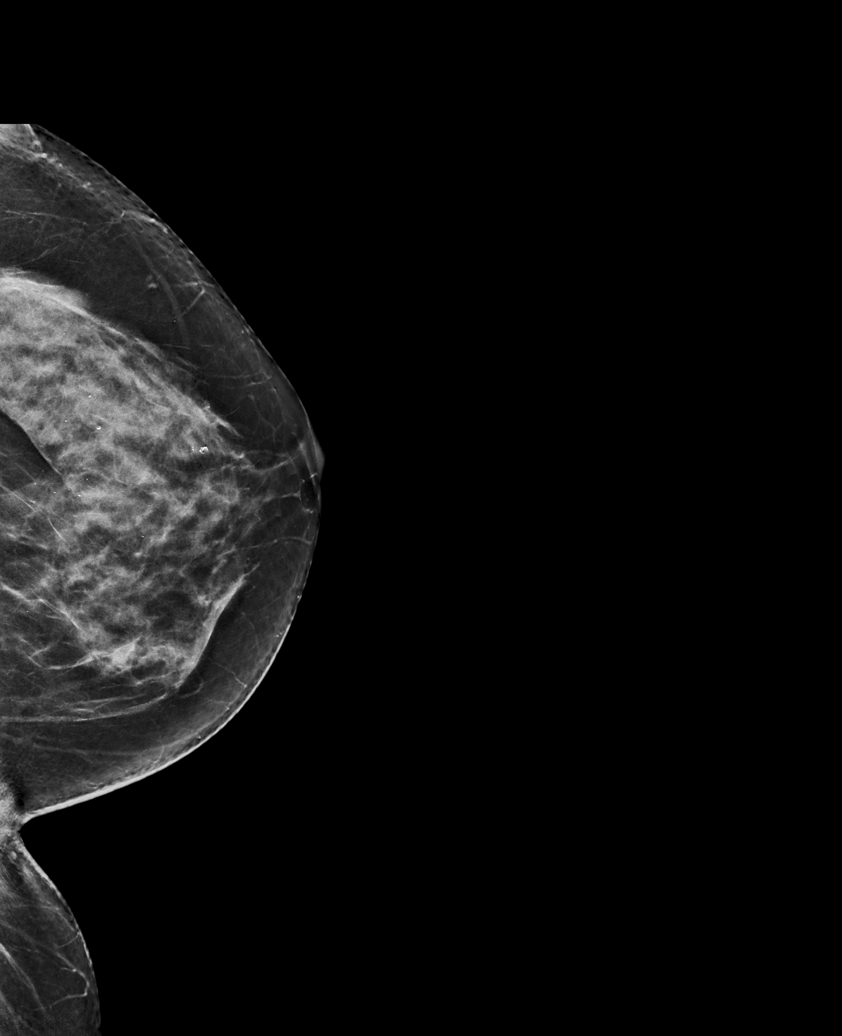

[L XCCL synth-2D]
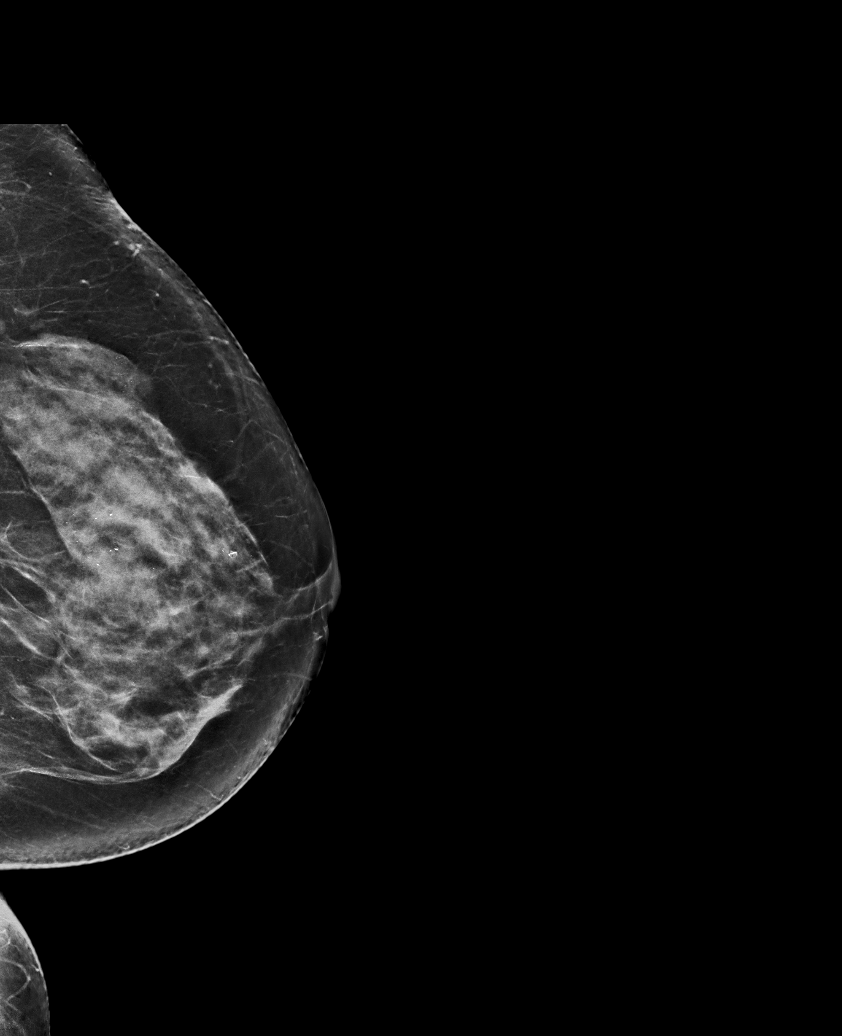

[R CC synth-2D]
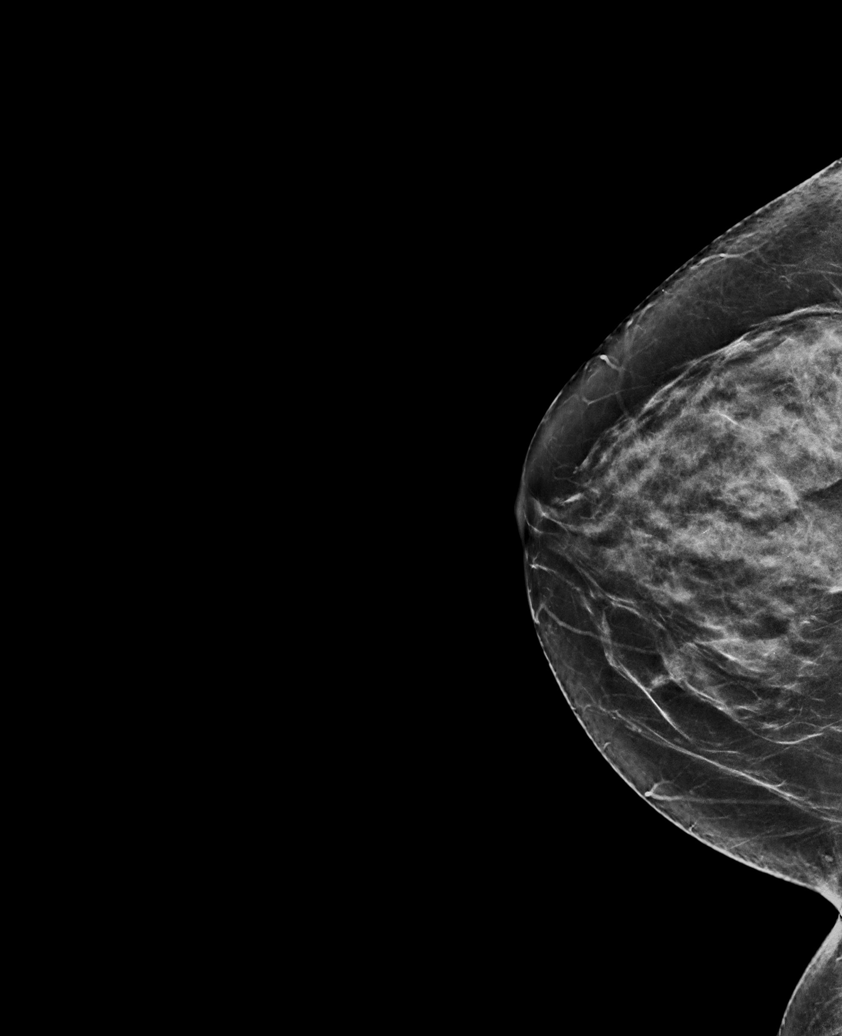

[R MLO synth-2D]
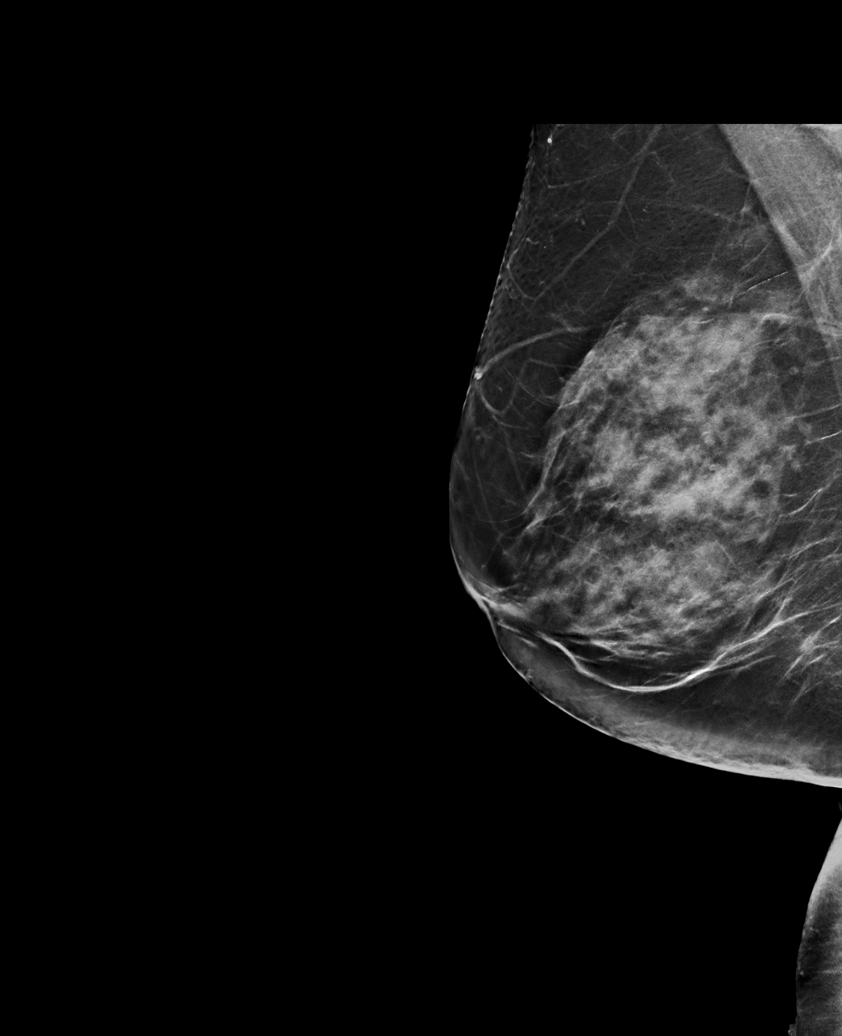

[L MLO synth-2D]
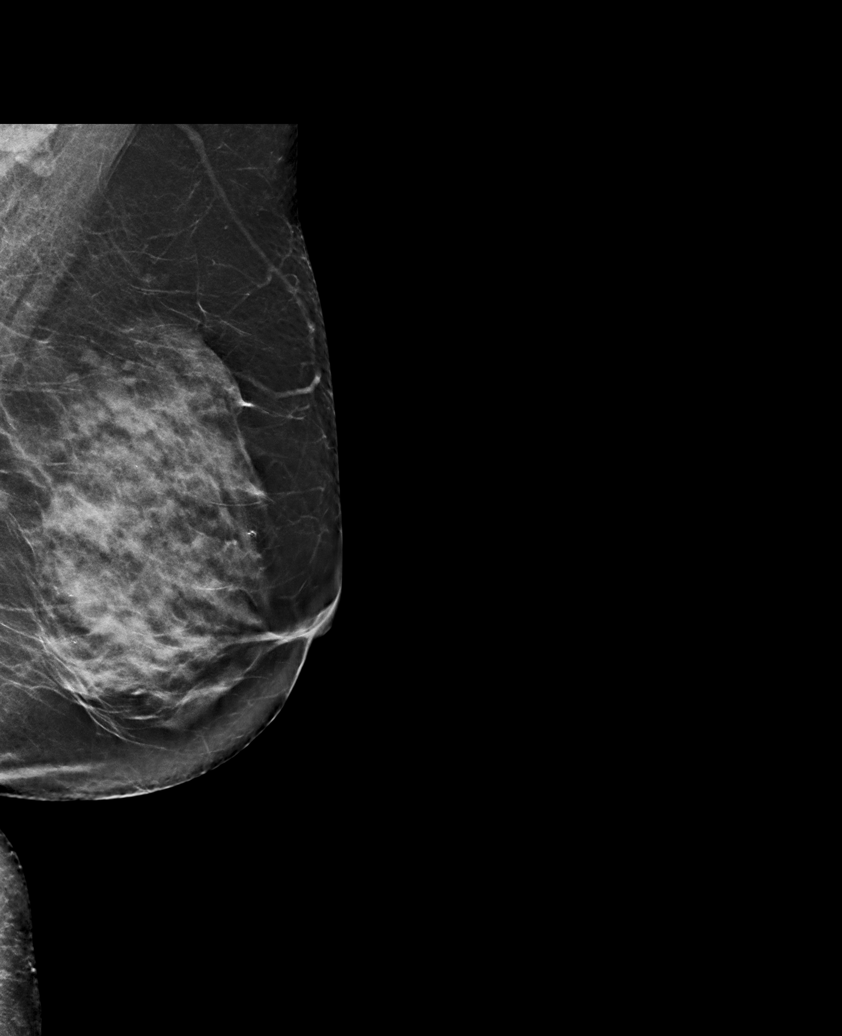

[6 of 36 positions shown; findings below may reference images not displayed]

ACR Breast Density Category c: The breast tissue is heterogeneously
dense, which may obscure small masses.
FINDINGS: There are no findings suspicious for malignancy. Images were
processed with CAD.
IMPRESSION: No mammographic evidence of malignancy. A result letter of this
screening mammogram will be mailed directly to the patient.

RECOMMENDATION:
Screening mammogram in one year. (Code:[id number])

BI-RADS CATEGORY  1: Negative.

## 2017-12-12 ENCOUNTER — Other Ambulatory Visit: Payer: Self-pay | Admitting: *Deleted

## 2017-12-12 ENCOUNTER — Inpatient Hospital Stay
Admission: RE | Admit: 2017-12-12 | Discharge: 2017-12-12 | Disposition: A | Discharge status: Home / Self Care | Payer: Self-pay | Source: Ambulatory Visit | Attending: *Deleted | Admitting: *Deleted

## 2017-12-12 DIAGNOSIS — Z9289 Personal history of other medical treatment: Secondary | ICD-10-CM

## 2018-04-25 ENCOUNTER — Telehealth: Payer: Self-pay | Admitting: Family Medicine

## 2018-04-25 NOTE — Telephone Encounter (Signed)
Please follow-up with patient on her outstanding hepatitis C lab; ask her to have that done as soon as convenient

## 2018-04-25 NOTE — Telephone Encounter (Signed)
Left detailed VM, CRM created.  

## 2018-08-15 DIAGNOSIS — Z86010 Personal history of colonic polyps: Secondary | ICD-10-CM | POA: Diagnosis not present

## 2018-08-15 DIAGNOSIS — K5100 Ulcerative (chronic) pancolitis without complications: Secondary | ICD-10-CM | POA: Diagnosis not present

## 2018-09-09 DIAGNOSIS — Z9181 History of falling: Secondary | ICD-10-CM | POA: Diagnosis not present

## 2018-09-09 DIAGNOSIS — Z960 Presence of urogenital implants: Secondary | ICD-10-CM | POA: Diagnosis not present

## 2018-09-09 DIAGNOSIS — K5190 Ulcerative colitis, unspecified, without complications: Secondary | ICD-10-CM | POA: Diagnosis not present

## 2018-09-10 ENCOUNTER — Telehealth: Payer: Self-pay

## 2018-09-10 NOTE — Telephone Encounter (Signed)
She will need an appointment - preferably in person, so that we can evaluate her eye and determine If antibiotic and/or eye referral is needed.

## 2018-09-10 NOTE — Telephone Encounter (Signed)
Appt scheduled

## 2018-09-10 NOTE — Telephone Encounter (Signed)
Copied from [place] [id number]. Topic: Appointment Scheduling - Scheduling Inquiry for Clinic >> Sep 09, 2018  4:07 PM Shirley Lowe wrote: Reason for CRM: Pt states she currently has her third stye in the past 6 months. Pt states she uses Lowe warm compress to get rid of them, but it comes back. Please advise.

## 2018-09-12 ENCOUNTER — Other Ambulatory Visit: Payer: Self-pay

## 2018-09-12 ENCOUNTER — Ambulatory Visit: Payer: Medicare HMO | Admitting: Family Medicine

## 2018-09-12 ENCOUNTER — Encounter: Payer: Self-pay | Admitting: Family Medicine

## 2018-09-12 VITALS — BP 128/80 | HR 81 | Temp 98.0°F | Resp 14 | Ht 64.0 in | Wt 180.3 lb

## 2018-09-12 DIAGNOSIS — H00014 Hordeolum externum left upper eyelid: Secondary | ICD-10-CM | POA: Diagnosis not present

## 2018-09-12 MED ORDER — ERYTHROMYCIN 5 MG/GM OP OINT: 1.0000 "application " | TOPICAL_OINTMENT | Freq: Three times a day (TID) | OPHTHALMIC | 0 refills | Status: DC

## 2018-09-12 NOTE — Progress Notes (Signed)
Name: Shirley Lowe   MRN: [id number]    DOB: 1951-01-04   Date:09/12/2018       Progress Note  Subjective  Chief Complaint  Chief Complaint  Patient presents with  . Stye    left eye lid for 1 week. Has had 3 in the last 6 months    HPI  Pt presents with concern for recurrent stye.  She has had 3 on the left eye in the last 6 months.  Currently she has a large stye on the LEFT upper lid in the center that is tender to palpation but is not painful or intolerable at rest.  No exudate or bleeding, no vision changes.  She has not been wearing make up. Has applied hot compresses without relief.  Patient Active Problem List   Diagnosis Date Noted  . Osteoporosis 07/17/2017  . Lipodermatosclerosis 07/17/2017  . Urinary incontinence in female 07/17/2017  . 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 Obesity (BMI 30-39.9) 07/17/2017    Social History   Tobacco Use  . Smoking status: Never Smoker  . Smokeless tobacco: Never Used  . Tobacco comment: smoking cessation materials not required  Substance Use Topics  . Alcohol use: No     Current Outpatient Medications:  .  Ascorbic Acid (VITAMIN C) 1000 MG tablet, Take 1,000 mg by mouth daily., Disp: , Rfl:  .  b complex vitamins tablet, Take 1 tablet by mouth daily., Disp: , Rfl:  .  Calcium Carbonate-Vitamin D (CALTRATE 600+D PO), Take 2 tablets by mouth daily., Disp: , Rfl:  .  mesalamine (LIALDA) 1.2 g EC tablet, Take 2.4 g by mouth daily., Disp: , Rfl:  .  Omega-3 Fatty Acids (FISH OIL) 1000 MG CAPS, Take 1 capsule by mouth daily., Disp: , Rfl:  .  vitamin E 100 UNIT capsule, Take 100 Units by mouth daily., Disp: , Rfl:  .  clobetasol cream (TEMOVATE) 0.76 %, Apply 1 application topically at bedtime., Disp: 30 g, Rfl: 5  Allergies  Allergen Reactions  . Penicillins     Has yeast infection    I personally reviewed active problem list, medication list, allergies, notes from last encounter, lab  results with the patient/caregiver today.  ROS  Constitutional: Negative for fever or weight change.  Respiratory: Negative for cough and shortness of breath.   Cardiovascular: Negative for chest pain or palpitations.  Gastrointestinal: Negative for abdominal pain, no bowel changes.  Musculoskeletal: Negative for gait problem or joint swelling.  Skin: Negative for rash.  Neurological: Negative for dizziness or headache.  No other specific complaints in a complete review of systems (except as listed in HPI above).  Objective  Vitals:   09/12/18 0911  BP: 128/80  Pulse: 81  Resp: 14  Temp: 98 F (36.7 C)  TempSrc: Oral  SpO2: 97%  Weight: 180 lb 4.8 oz (81.8 kg)  Height: 5\' 4"  (1.626 m)    Body mass index is 30.95 kg/m.  Nursing Note and Vital Signs reviewed.  Physical Exam  Constitutional: Patient appears well-developed and well-nourished. No distress.  HEENT: head atraumatic, normocephalic, pupils equal and reactive to light.  There is a pencil eraser sized hordeolum on the central upper lid of the left eye that is erythematous without exudate.  Neck supple without lymphadenopathy, throat within normal limits - no erythema or exudate, no tonsillar swelling Cardiovascular: Normal rate, regular rhythm and normal heart sounds.  No murmur heard. No BLE edema. Pulmonary/Chest: Effort normal and breath  sounds clear bilaterally. No respiratory distress. Psychiatric: Patient has a normal mood and affect. behavior is normal. Judgment and thought content normal.  No results found for this or any previous visit (from the past 72 hour(s)).  Assessment & Plan  1. Hordeolum externum of left upper eyelid - Due to recurrence, we will do 1 week of antibiotic ointment.  If continuing to have recurrence, will refer to ophthalmology.  - erythromycin ophthalmic ointment; Place 1 application into the left eye 3 (three) times daily.  Dispense: 3.5 g; Refill: 0  -Red flags and when to  present for emergency care or RTC including fever >101.46F, chest pain, shortness of breath, new/worsening/un-resolving symptoms, reviewed with patient at time of visit. Follow up and care instructions discussed and provided in AVS.

## 2018-09-19 ENCOUNTER — Ambulatory Visit (INDEPENDENT_AMBULATORY_CARE_PROVIDER_SITE_OTHER): Payer: Medicare HMO

## 2018-09-19 VITALS — Ht 64.0 in | Wt 180.0 lb

## 2018-09-19 DIAGNOSIS — Z0000 Encounter for general adult medical examination without abnormal findings: Secondary | ICD-10-CM

## 2018-09-19 DIAGNOSIS — Z1231 Encounter for screening mammogram for malignant neoplasm of breast: Secondary | ICD-10-CM

## 2018-09-19 DIAGNOSIS — M810 Age-related osteoporosis without current pathological fracture: Secondary | ICD-10-CM | POA: Diagnosis not present

## 2018-09-19 NOTE — Patient Instructions (Signed)
Shirley Lowe , Thank you for taking time to come for your Medicare Wellness Visit. I appreciate your ongoing commitment to your health goals. Please review the following plan we discussed and let me know if I can assist you in the future.   Screening recommendations/referrals: Colonoscopy: done 2017. Repeat in 2020. Mammogram: done 12/12/17. Please call [phone] to schedule your mammogram and bone density screening.  Bone Density: done 2017 Recommended yearly ophthalmology/optometry visit for glaucoma screening and checkup Recommended yearly dental visit for hygiene and checkup  Vaccinations: Influenza vaccine: postponed Pneumococcal vaccine: postponed Tdap vaccine: postponed Shingles vaccine: postponed    Advanced directives: Please bring a copy of your health care power of attorney and living will to the office at your convenience.  Conditions/risks identified: Recommend increasing physical activity   Next appointment: Please follow up in one year for your Medicare Annual Wellness visit.     Preventive Care 31 Years and Older, Female Preventive care refers to lifestyle choices and visits with your health care provider that can promote health and wellness. What does preventive care include?  A yearly physical exam. This is also called an annual well check.  Dental exams once or twice a year.  Routine eye exams. Ask your health care provider how often you should have your eyes checked.  Personal lifestyle choices, including:  Daily care of your teeth and gums.  Regular physical activity.  Eating a healthy diet.  Avoiding tobacco and drug use.  Limiting alcohol use.  Practicing safe sex.  Taking low-dose aspirin every day.  Taking vitamin and mineral supplements as recommended by your health care provider. What happens during an annual well check? The services and screenings done by your health care provider during your annual well check will depend on your  age, overall health, lifestyle risk factors, and family history of disease. Counseling  Your health care provider may ask you questions about your:  Alcohol use.  Tobacco use.  Drug use.  Emotional well-being.  Home and relationship well-being.  Sexual activity.  Eating habits.  History of falls.  Memory and ability to understand (cognition).  Work and work Statistician.  Reproductive health. Screening  You may have the following tests or measurements:  Height, weight, and BMI.  Blood pressure.  Lipid and cholesterol levels. These may be checked every 5 years, or more frequently if you are over 40 years old.  Skin check.  Lung cancer screening. You may have this screening every year starting at age 50 if you have a 30-pack-year history of smoking and currently smoke or have quit within the past 15 years.  Fecal occult blood test (FOBT) of the stool. You may have this test every year starting at age 16.  Flexible sigmoidoscopy or colonoscopy. You may have a sigmoidoscopy every 5 years or a colonoscopy every 10 years starting at age 31.  Hepatitis C blood test.  Hepatitis B blood test.  Sexually transmitted disease (STD) testing.  Diabetes screening. This is done by checking your blood sugar (glucose) after you have not eaten for a while (fasting). You may have this done every 1-3 years.  Bone density scan. This is done to screen for osteoporosis. You may have this done starting at age 11.  Mammogram. This may be done every 1-2 years. Talk to your health care provider about how often you should have regular mammograms. Talk with your health care provider about your test results, treatment options, and if necessary, the need for more tests. Vaccines  Your health care provider may recommend certain vaccines, such as:  Influenza vaccine. This is recommended every year.  Tetanus, diphtheria, and acellular pertussis (Tdap, Td) vaccine. You may need a Td booster  every 10 years.  Zoster vaccine. You may need this after age 31.  Pneumococcal 13-valent conjugate (PCV13) vaccine. One dose is recommended after age 47.  Pneumococcal polysaccharide (PPSV23) vaccine. One dose is recommended after age 60. Talk to your health care provider about which screenings and vaccines you need and how often you need them. This information is not intended to replace advice given to you by your health care provider. Make sure you discuss any questions you have with your health care provider. Document Released: 05/14/2015 Document Revised: 01/05/2016 Document Reviewed: 02/16/2015 Elsevier Interactive Patient Education  2017 [place] Prevention in the Home Falls can cause injuries. They can happen to people of all ages. There are many things you can do to make your home safe and to help prevent falls. What can I do on the outside of my home?  Regularly fix the edges of walkways and driveways and fix any cracks.  Remove anything that might make you trip as you walk through a door, such as a raised step or threshold.  Trim any bushes or trees on the path to your home.  Use bright outdoor lighting.  Clear any walking paths of anything that might make someone trip, such as rocks or tools.  Regularly check to see if handrails are loose or broken. Make sure that both sides of any steps have handrails.  Any raised decks and porches should have guardrails on the edges.  Have any leaves, snow, or ice cleared regularly.  Use sand or salt on walking paths during winter.  Clean up any spills in your garage right away. This includes oil or grease spills. What can I do in the bathroom?  Use night lights.  Install grab bars by the toilet and in the tub and shower. Do not use towel bars as grab bars.  Use non-skid mats or decals in the tub or shower.  If you need to sit down in the shower, use a plastic, non-slip stool.  Keep the floor dry. Clean up any  water that spills on the floor as soon as it happens.  Remove soap buildup in the tub or shower regularly.  Attach bath mats securely with double-sided non-slip rug tape.  Do not have throw rugs and other things on the floor that can make you trip. What can I do in the bedroom?  Use night lights.  Make sure that you have a light by your bed that is easy to reach.  Do not use any sheets or blankets that are too big for your bed. They should not hang down onto the floor.  Have a firm chair that has side arms. You can use this for support while you get dressed.  Do not have throw rugs and other things on the floor that can make you trip. What can I do in the kitchen?  Clean up any spills right away.  Avoid walking on wet floors.  Keep items that you use a lot in easy-to-reach places.  If you need to reach something above you, use a strong step stool that has a grab bar.  Keep electrical cords out of the way.  Do not use floor polish or wax that makes floors slippery. If you must use wax, use non-skid floor wax.  Do  not have throw rugs and other things on the floor that can make you trip. What can I do with my stairs?  Do not leave any items on the stairs.  Make sure that there are handrails on both sides of the stairs and use them. Fix handrails that are broken or loose. Make sure that handrails are as long as the stairways.  Check any carpeting to make sure that it is firmly attached to the stairs. Fix any carpet that is loose or worn.  Avoid having throw rugs at the top or bottom of the stairs. If you do have throw rugs, attach them to the floor with carpet tape.  Make sure that you have a light switch at the top of the stairs and the bottom of the stairs. If you do not have them, ask someone to add them for you. What else can I do to help prevent falls?  Wear shoes that:  Do not have high heels.  Have rubber bottoms.  Are comfortable and fit you well.  Are closed  at the toe. Do not wear sandals.  If you use a stepladder:  Make sure that it is fully opened. Do not climb a closed stepladder.  Make sure that both sides of the stepladder are locked into place.  Ask someone to hold it for you, if possible.  Clearly mark and make sure that you can see:  Any grab bars or handrails.  First and last steps.  Where the edge of each step is.  Use tools that help you move around (mobility aids) if they are needed. These include:  Canes.  Walkers.  Scooters.  Crutches.  Turn on the lights when you go into a dark area. Replace any light bulbs as soon as they burn out.  Set up your furniture so you have a clear path. Avoid moving your furniture around.  If any of your floors are uneven, fix them.  If there are any pets around you, be aware of where they are.  Review your medicines with your doctor. Some medicines can make you feel dizzy. This can increase your chance of falling. Ask your doctor what other things that you can do to help prevent falls. This information is not intended to replace advice given to you by your health care provider. Make sure you discuss any questions you have with your health care provider. Document Released: 02/11/2009 Document Revised: 09/23/2015 Document Reviewed: 05/22/2014 Elsevier Interactive Patient Education  2017 [employer].

## 2018-09-19 NOTE — Progress Notes (Signed)
Subjective:   Shirley Lowe is a 68 y.o. female who presents for Medicare Annual (Subsequent) preventive examination.   Virtual Visit via Telephone Note  I connected with Shirley Lowe on 09/19/18 at 10:40 AM EDT by telephone and verified that I am speaking with the correct person using two identifiers.  Medicare Annual Wellness visit completed telephonically due to Covid-19 pandemic.   Location: Patient: home Provider: office   I discussed the limitations, risks, security and privacy concerns of performing an evaluation and management service by telephone and the availability of in person appointments.The patient expressed understanding and agreed to proceed.  Some vital signs may be absent or patient reported  Shirley Marker, LPN   Review of Systems:   Cardiac Risk Factors include: advanced age (>105mn, >>84women);obesity (BMI >30kg/m2)     Objective:     Vitals: Ht 5' 4"  (1.626 m)    Wt 180 lb (81.6 kg)    BMI 30.90 kg/m   Body mass index is 30.9 kg/m.  Advanced Directives 09/19/2018 09/13/2017  Does Patient Have a Medical Advance Directive? Yes Yes  Type of Advance Directive Living will;Healthcare Power of [place] will  Copy of [place] Chart? No - copy requested No - copy requested    Tobacco Social History   Tobacco Use  Smoking Status Never Smoker  Smokeless Tobacco Never Used  Tobacco Comment   smoking cessation materials not required     Counseling given: Not Answered Comment: smoking cessation materials not required   Clinical Intake:  Pre-visit preparation completed: Yes  Pain : No/denies pain     BMI - recorded: 30.9 Nutritional Status: BMI > 30  Obese Nutritional Risks: None Diabetes: No  How often do you need to have someone help you when you read instructions, pamphlets, or other written materials from your doctor or pharmacy?: 1 - Never  Interpreter Needed?:  No  Information entered by :: Shirley Lowe  Past Medical History:  Diagnosis Date   Osteoporosis    Ulcerative colitis    Past Surgical History:  Procedure Laterality Date   ABDOMINAL HYSTERECTOMY     INCONTINENCE SURGERY     TONSILLECTOMY     Family History  Problem Relation Age of Onset   Cancer Mother        breast cancer   Breast cancer Mother 650      metastic   Cancer Father        pancreatic   Hepatitis C Sister    Thyroid disease Sister    Irritable bowel syndrome Sister    Allergies Sister    Social History   Socioeconomic History   Marital status: Married    Spouse name: [name]  Number of children: 3   Years of education: Not on file   Highest education level: Bachelor's degree (e.g., BA, AB, BS)  Occupational History   Occupation: Retired  SScientist, product/process developmentstrain: Not hard at aInternational Paperinsecurity:    Worry: Never true    Inability: Never true   Transportation needs:    Medical: No    Non-medical: No  Tobacco Use   Smoking status: Never Smoker   Smokeless tobacco: Never Used   Tobacco comment: smoking cessation materials not required  Substance and Sexual Activity   Alcohol use: No   Drug use: No   Sexual activity: Not Currently  Lifestyle   Physical activity:  Days per week: 0 days    Minutes per session: 0 min   Stress: Not at all  Relationships   Social connections:    Talks on phone: More than three times a week    Gets together: Twice a week    Attends religious service: More than 4 times per year    Active member of club or organization: No    Attends meetings of clubs or organizations: Never    Relationship status: Married  Other Topics Concern   Not on file  Social History Narrative   Not on file    Outpatient Encounter Medications as of 09/19/2018  Medication Sig   Ascorbic Acid (VITAMIN C) 1000 MG tablet Take 1,000 mg by mouth daily.   b complex vitamins tablet Take 1  tablet by mouth daily.   Calcium Carbonate-Vitamin D (CALTRATE 600+D PO) Take 2 tablets by mouth daily.   clobetasol cream (TEMOVATE) 5.72 % Apply 1 application topically at bedtime.   erythromycin ophthalmic ointment Place 1 application into the left eye 3 (three) times daily.   mesalamine (LIALDA) 1.2 g EC tablet Take 2.4 g by mouth daily.   Omega-3 Fatty Acids (FISH OIL) 1000 MG CAPS Take 1 capsule by mouth daily.   vitamin E 100 UNIT capsule Take 100 Units by mouth daily.   No facility-administered encounter medications on file as of 09/19/2018.     Activities of Daily Living In your present state of health, do you have any difficulty performing the following activities: 09/19/2018  Hearing? N  Comment declines hearing aids  Vision? N  Comment wears glasses  Difficulty concentrating or making decisions? N  Walking or climbing stairs? N  Dressing or bathing? N  Doing errands, shopping? N  Preparing Food and eating ? N  Using the Toilet? N  In the past six months, have you accidently leaked urine? Y  Comment wears pads for protection  Do you have problems with loss of bowel control? N  Managing your Medications? N  Managing your Finances? N  Housekeeping or managing your Housekeeping? N  Some recent data might be hidden    Patient Care Team: Shirley Lowe, Shirley Anis, MD as PCP - General (Family Medicine) Shirley Essex, MD as Consulting Physician (Gastroenterology)    Assessment:   This is a routine wellness examination for [place].  Exercise Activities and Dietary recommendations Current Exercise Habits: The patient does not participate in regular exercise at present, Exercise limited by: None identified  Goals     DIET - INCREASE WATER INTAKE     Recommend to drink at least 6-8 8oz glasses of water per day.     Increase physical activity     Recommend increasing physical activity to at least 150 minutes per week       Fall Risk Fall Risk  09/19/2018 09/12/2018 09/13/2017  07/17/2017  Falls in the past year? 0 0 No No  Number falls in past yr: 0 0 - -  Injury with Fall? 0 0 - -  Risk for fall due to : - - Impaired vision -  Risk for fall due to: Comment - - wears glasses, early signs of cataracts -  Follow up Falls prevention discussed Follow up appointment - -   FALL RISK PREVENTION PERTAINING TO THE HOME:  Any stairs in or around the home? Yes  If so, do they handrails? Yes   Home free of loose throw rugs in walkways, pet beds, electrical cords, etc? Yes  Adequate lighting in your home to reduce risk of falls? Yes   ASSISTIVE DEVICES UTILIZED TO PREVENT FALLS:  Life alert? No  Use of a cane, walker or w/c? No  Grab bars in the bathroom? yes Shower chair or bench in shower? Yes  Elevated toilet seat or a handicapped toilet? Yes   DME ORDERS:  DME order needed?  No   TIMED UP AND GO:  Was the test performed? No .   Education: Fall risk prevention has been discussed.  Intervention(s) required? No    Depression Screen PHQ 2/9 Scores 09/19/2018 09/12/2018 09/13/2017 07/17/2017  PHQ - 2 Score 0 0 0 0  PHQ- 9 Score 1 1 - -     Cognitive Function     6CIT Screen 09/13/2017  What Year? 0 points  What month? 0 points  What time? 0 points  Count back from 20 0 points  Months in reverse 0 points  Repeat phrase 0 points  Total Score 0     There is no immunization history on file for this patient.  Qualifies for Shingles Vaccine? Yes . Due for Shingrix. Education has been provided regarding the importance of this vaccine. Pt has been advised to call insurance company to determine out of pocket expense. Advised may also receive vaccine at local pharmacy or Health Dept. Verbalized acceptance and understanding. Pt declines.   Tdap: Although this vaccine is not a covered service during a Wellness Exam, does the patient still wish to receive this vaccine today?  No .  Education has been provided regarding the importance of this vaccine. Advised  may receive this vaccine at local pharmacy or Health Dept. Aware to provide a copy of the vaccination record if obtained from local pharmacy or Health Dept. Verbalized acceptance and understanding.  Flu Vaccine: Due for Flu vaccine. Does the patient want to receive this vaccine today?  No . Education has been provided regarding the importance of this vaccine but still declined. Advised may receive this vaccine at local pharmacy or Health Dept. Aware to provide a copy of the vaccination record if obtained from local pharmacy or Health Dept. Verbalized acceptance and understanding.  Pneumococcal Vaccine: Due for Pneumococcal vaccine. Does the patient want to receive this vaccine today?  No . Education has been provided regarding the importance of this vaccine but still declined. Advised may receive this vaccine at local pharmacy or Health Dept. Aware to provide a copy of the vaccination record if obtained from local pharmacy or Health Dept. Verbalized acceptance and understanding.   Screening Tests Health Maintenance  Topic Date Due   Hepatitis C Screening  16-Mar-1951   TETANUS/TDAP  05/08/1968   PNA vac Low Risk Adult (1 of 2 - PCV13) 05/08/2014   INFLUENZA VACCINE  11/30/2018   MAMMOGRAM  12/06/2018   COLONOSCOPY  05/01/2025   DEXA SCAN  Completed   Cancer Screenings:  Colorectal Screening: Completed 2017 Dr. Watt Climes in [place]. Repeat every 3 years due to ulcerative colitis; pt to be contacted by their office to set up screening colonoscopy later this year after Covid-19.    Mammogram: Completed 12/12/17. Repeat every year;  Ordered today. Pt provided with contact information and advised to call to schedule appt.   Bone Density: Completed 2017. Results reflect OSTEOPOROSIS. Repeat every 2 years. Ordered today due to no exam done from previous order in 2019. Pt provided with contact information and advised to call to schedule appt.   Lung Cancer Screening: (Low Dose CT Chest  recommended if Age 69-80 years, 40 pack-year currently smoking OR have quit w/in 15years.) does not qualify.    Additional Screening:  Hepatitis C Screening: does qualify; postponed  Vision Screening: Recommended annual ophthalmology exams for early detection of glaucoma and other disorders of the eye. Is the patient up to date with their annual eye exam?  Yes  Who is the provider or what is the name of the office in which the pt attends annual eye exams? Dr. Ellin Mayhew  Dental Screening: Recommended annual dental exams for proper oral hygiene  Community Resource Referral:  CRR required this visit?  No      Plan:     I have personally reviewed and addressed the Medicare Annual Wellness questionnaire and have noted the following in the patients chart:  A. Medical and social history B. Use of alcohol, tobacco or illicit drugs  C. Current medications and supplements D. Functional ability and status E.  Nutritional status F.  Physical activity G. Advance directives H. List of other physicians I.  Hospitalizations, surgeries, and ER visits in previous 12 months J.  Tool such as hearing and vision if needed, cognitive and depression L. Referrals and appointments   In addition, I have reviewed and discussed with patient certain preventive protocols, quality metrics, and best practice recommendations. A written personalized care plan for preventive services as well as general preventive health recommendations were provided to patient.   Signed,  Shirley Marker, LPN Nurse Health Advisor   Nurse Notes: pt doing well. She is finishing eye ointment for antibiotic and states her sty is getting better but not completely gone away yet. Advised patient she is due for yearly exam and needs to schedule appt. Pt voiced understanding.

## 2018-12-09 ENCOUNTER — Telehealth: Payer: Self-pay | Admitting: Family Medicine

## 2018-12-09 DIAGNOSIS — Z1239 Encounter for other screening for malignant neoplasm of breast: Secondary | ICD-10-CM

## 2018-12-09 DIAGNOSIS — M810 Age-related osteoporosis without current pathological fracture: Secondary | ICD-10-CM

## 2018-12-09 NOTE — Telephone Encounter (Signed)
Patient is calling to request a refferal for a mammogram and Bone Density to to [facility]  Please advise Thank you

## 2018-12-09 NOTE — Telephone Encounter (Signed)
Orders placed. Please schedule routine follow up in 4 months Please do call to schedule your mammogram/DEXA; the number to schedule one at either [facility] or [facility] is [phone]

## 2018-12-09 NOTE — Addendum Note (Signed)
Addended by: Fredderick Severance on: 12/09/2018 04:45 PM   Modules accepted: Orders

## 2018-12-10 NOTE — Telephone Encounter (Signed)
lvm to sch [id number]

## 2019-01-16 ENCOUNTER — Other Ambulatory Visit: Payer: Medicare HMO

## 2019-02-26 ENCOUNTER — Ambulatory Visit
Admission: RE | Admit: 2019-02-26 | Discharge: 2019-02-26 | Disposition: A | Discharge status: Home / Self Care | Payer: Medicare HMO | Source: Ambulatory Visit | Attending: Family Medicine | Admitting: Family Medicine

## 2019-02-26 DIAGNOSIS — M810 Age-related osteoporosis without current pathological fracture: Secondary | ICD-10-CM | POA: Insufficient documentation

## 2019-02-26 DIAGNOSIS — Z1231 Encounter for screening mammogram for malignant neoplasm of breast: Secondary | ICD-10-CM | POA: Diagnosis present

## 2019-02-26 DIAGNOSIS — Z1239 Encounter for other screening for malignant neoplasm of breast: Secondary | ICD-10-CM

## 2019-02-26 IMAGING — MG DIGITAL SCREENING BILAT W/ TOMO
8 series · 8 of 24 positions shown · non-contrast
Comparison: None.

CLINICAL DATA: Screening.

EXAM:
DIGITAL SCREENING BILATERAL MAMMOGRAM WITH TOMO AND CAD

[L MLO synth-2D]
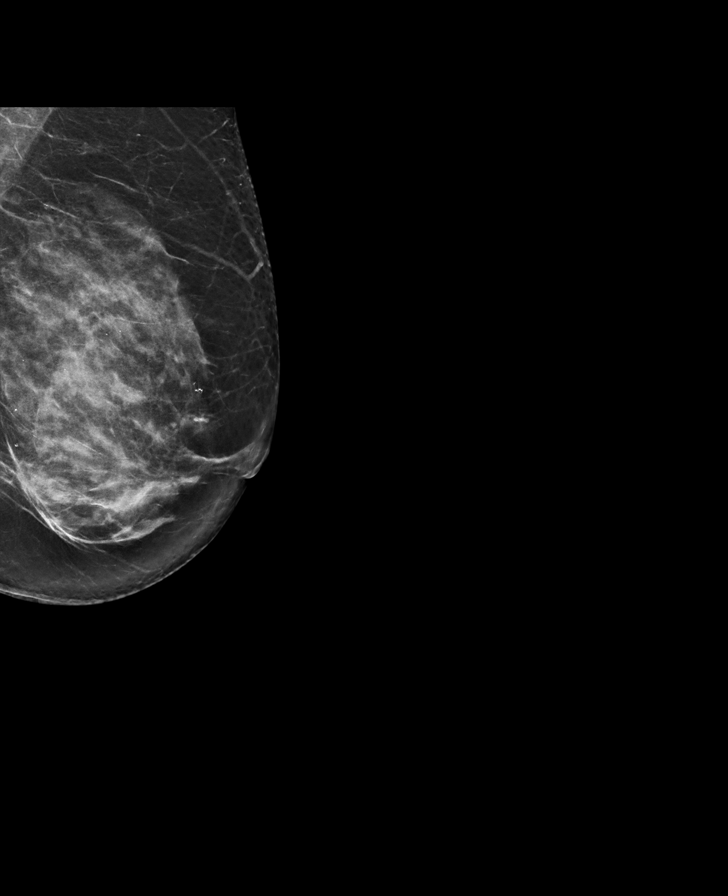

[L CC synth-2D]
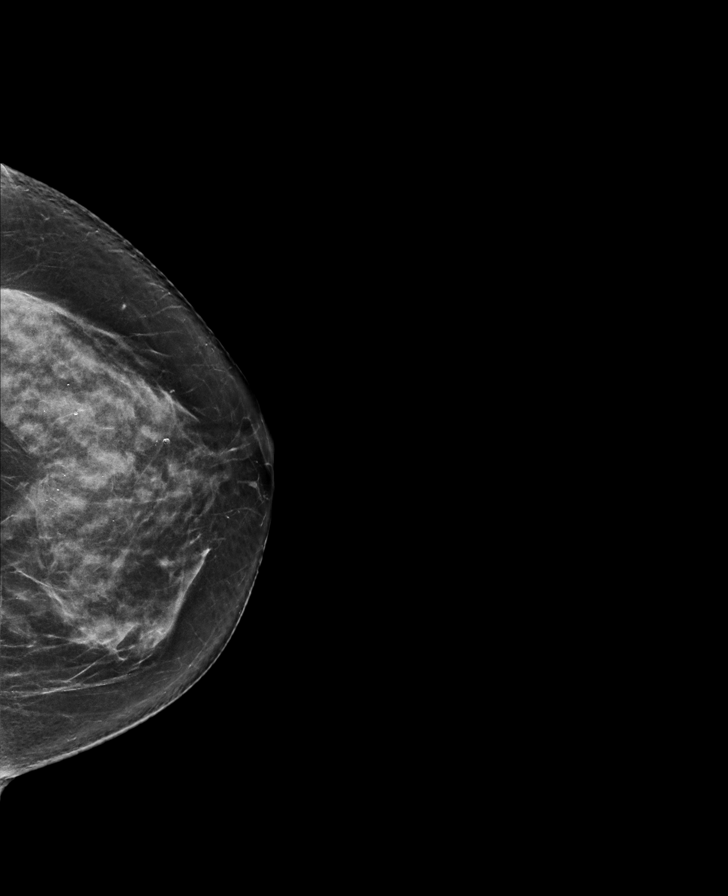

[R CC synth-2D]
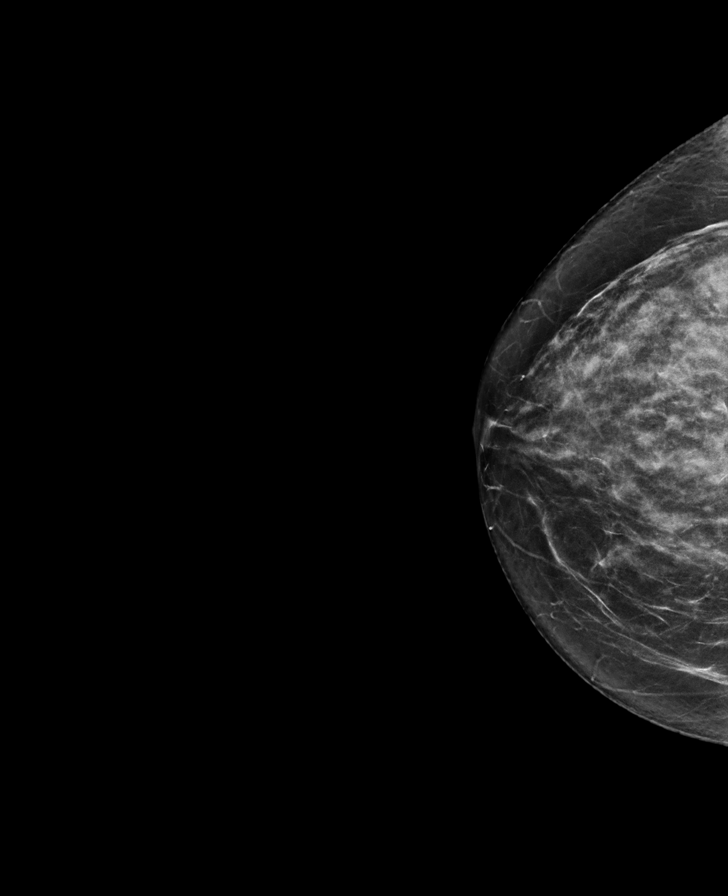

[R MLO synth-2D]
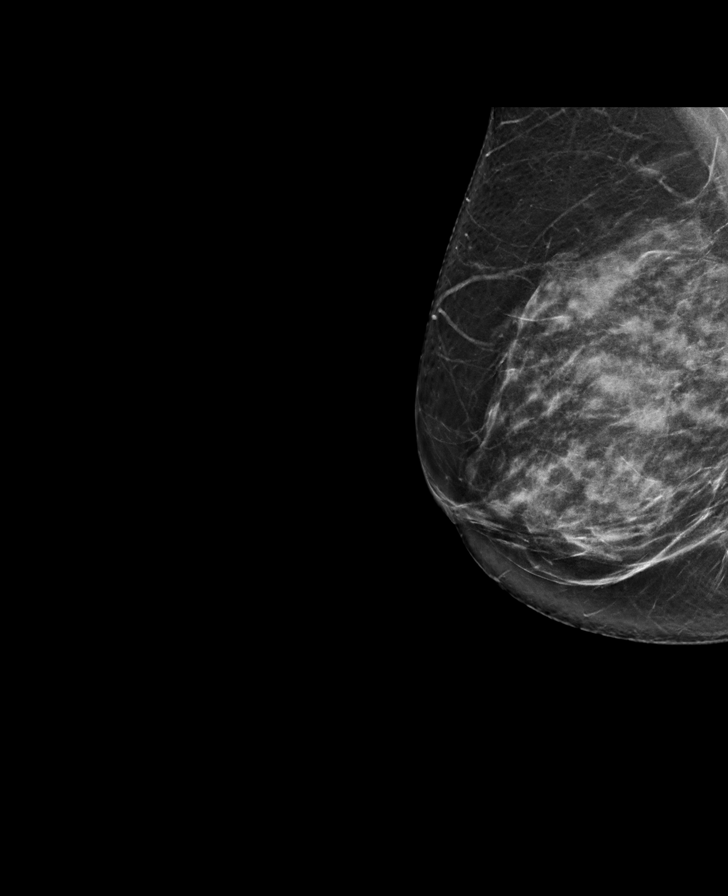

[R MLO tomo · tomo slice 40/79.0]
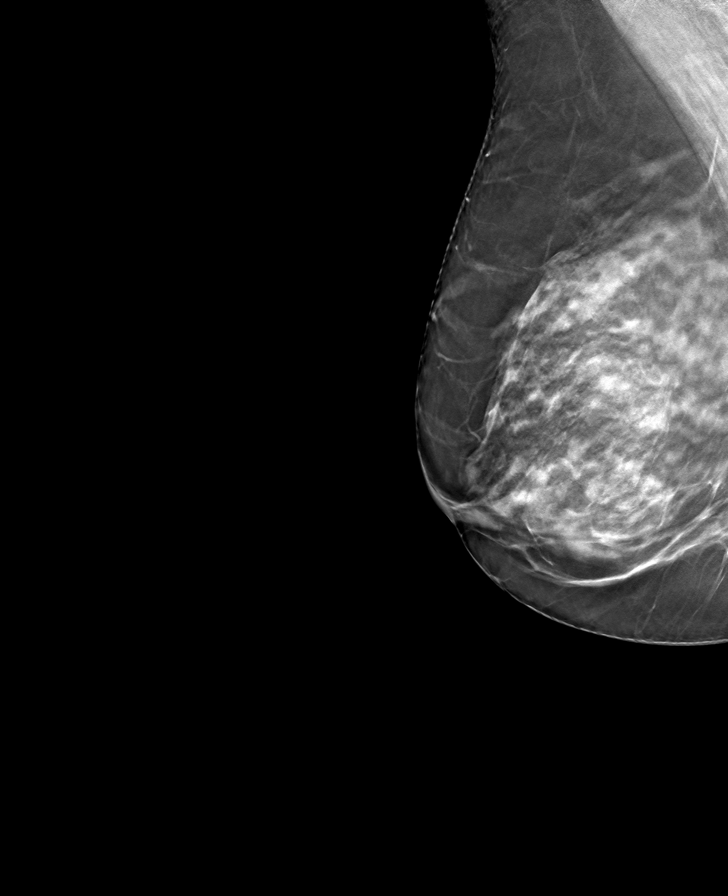

[L MLO tomo · tomo slice 39/77.0]
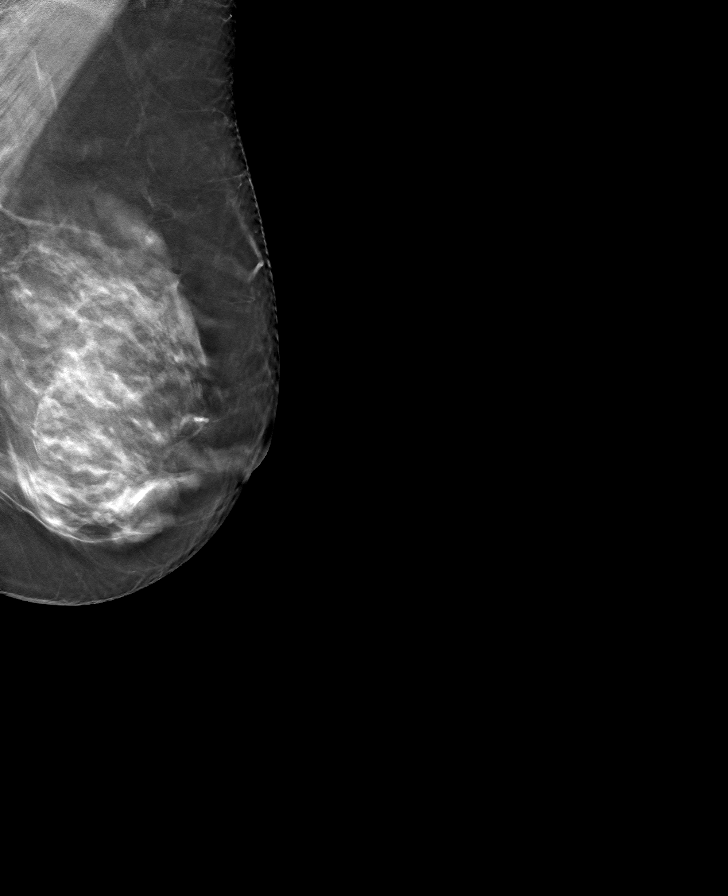

[L CC tomo · tomo slice 38/75.0]
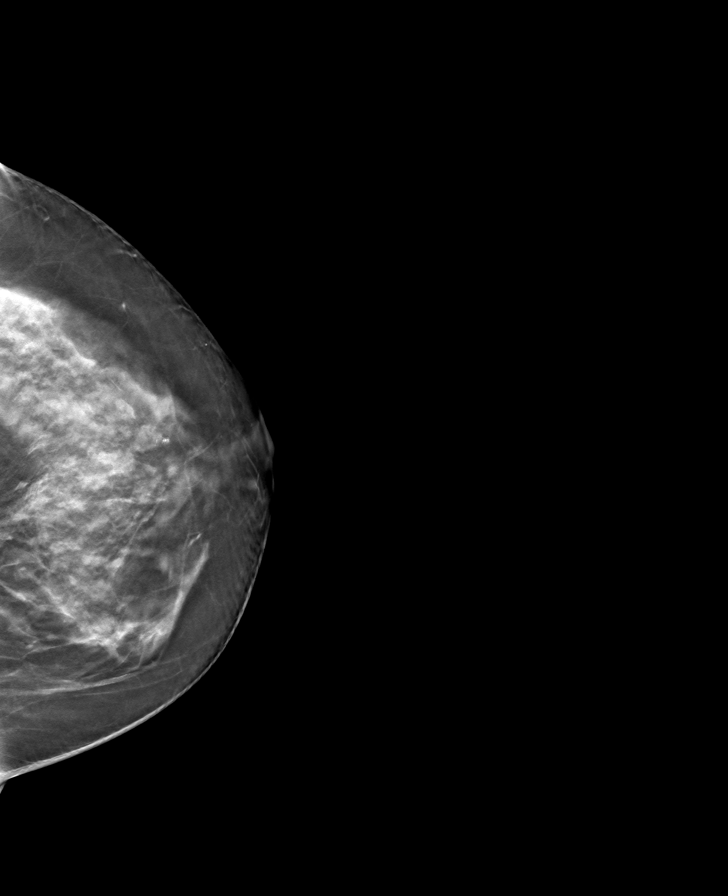

[R CC tomo · tomo slice 36/71.0]
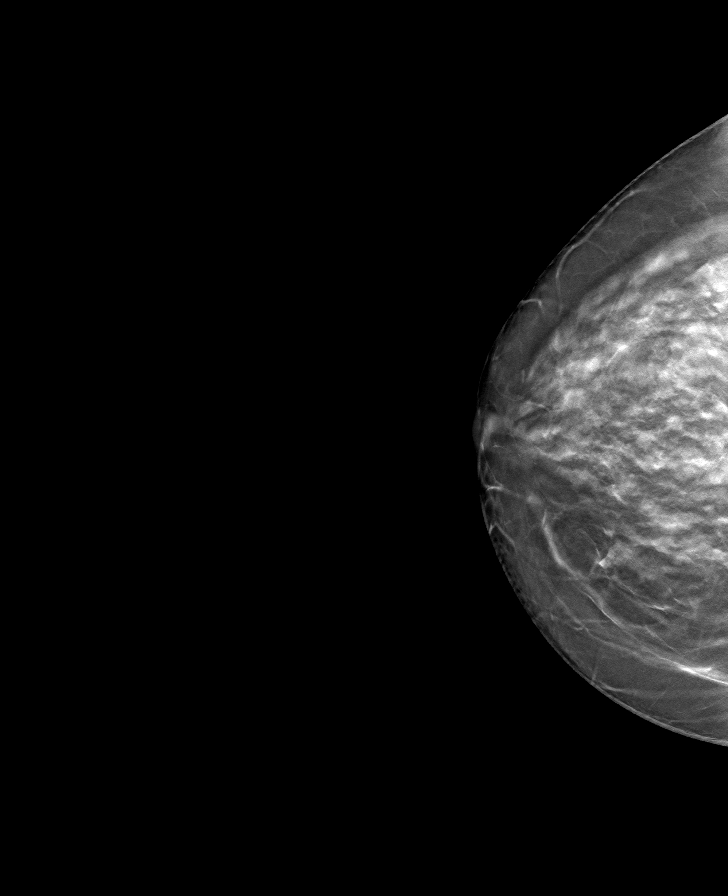

[8 of 24 positions shown; findings below may reference images not displayed]

ACR Breast Density Category b: There are scattered areas of
fibroglandular density.
FINDINGS: There are no findings suspicious for malignancy. Images were
processed with CAD.
IMPRESSION: No mammographic evidence of malignancy. A result letter of this
screening mammogram will be mailed directly to the patient.

RECOMMENDATION:
Screening mammogram in one year. (Code:[id number])

BI-RADS CATEGORY  1: Negative.

## 2019-03-07 ENCOUNTER — Ambulatory Visit (INDEPENDENT_AMBULATORY_CARE_PROVIDER_SITE_OTHER): Payer: Medicare HMO | Admitting: Family Medicine

## 2019-03-07 ENCOUNTER — Other Ambulatory Visit: Payer: Self-pay

## 2019-03-07 ENCOUNTER — Encounter: Payer: Self-pay | Admitting: Family Medicine

## 2019-03-07 VITALS — BP 118/76 | HR 85 | Temp 97.5°F | Resp 14 | Ht 64.0 in | Wt 182.2 lb

## 2019-03-07 DIAGNOSIS — R002 Palpitations: Secondary | ICD-10-CM

## 2019-03-07 DIAGNOSIS — M810 Age-related osteoporosis without current pathological fracture: Secondary | ICD-10-CM

## 2019-03-07 DIAGNOSIS — K51311 Ulcerative (chronic) rectosigmoiditis with rectal bleeding: Secondary | ICD-10-CM

## 2019-03-07 MED ORDER — MESALAMINE 1.2 G PO TBEC: 2.4000 g | DELAYED_RELEASE_TABLET | Freq: Every day | ORAL | 0 refills | Status: DC

## 2019-03-07 NOTE — Patient Instructions (Signed)
[name] ([address], [address])   Osteoporosis  Osteoporosis happens when your bones get thin and weak. This can cause your bones to break (fracture) more easily. You can do things at home to make your bones stronger. Follow these instructions at home:  Activity  Exercise as told by your doctor. Ask your doctor what activities are safe for you. You should do: ? Exercises that make your muscles work to hold your body weight up (weight-bearing exercises). These include tai chi, yoga, and walking. ? Exercises to make your muscles stronger. One example is lifting weights. Lifestyle  Limit alcohol intake to no more than 1 drink a day for nonpregnant women and 2 drinks a day for men. One drink equals 12 oz of beer, 5 oz of wine, or 1 oz of hard liquor.  Do not use any products that have nicotine or tobacco in them. These include cigarettes and e-cigarettes. If you need help quitting, ask your doctor. Preventing falls  Use tools to help you move around (mobility aids) as needed. These include canes, walkers, scooters, and crutches.  Keep rooms well-lit and free of clutter.  Put away things that could make you trip. These include cords and rugs.  Install safety rails on stairs. Install grab bars in bathrooms.  Use rubber mats in slippery areas, like bathrooms.  Wear shoes that: ? Fit you well. ? Support your feet. ? Have closed toes. ? Have rubber soles or low heels.  Tell your doctor about all of the medicines you are taking. Some medicines can make you more likely to fall. General instructions  Eat plenty of calcium and vitamin D. These nutrients are good for your bones. Good sources of calcium and vitamin D include: ? Some fatty fish, such as salmon and tuna. ? Foods that have calcium and vitamin D added to them (fortified foods). For example, some breakfast cereals are fortified with calcium and vitamin D. ? Egg yolks. ? Cheese. ?  Liver.  Take over-the-counter and prescription medicines only as told by your doctor.  Keep all follow-up visits as told by your doctor. This is important. Contact a doctor if:  You have not been tested (screened) for osteoporosis and you are: ? A woman who is age 31 or older. ? A man who is age 58 or older. Get help right away if:  You fall.  You get hurt. Summary  Osteoporosis happens when your bones get thin and weak.  Weak bones can break (fracture) more easily.  Eat plenty of calcium and vitamin D. These nutrients are good for your bones.  Tell your doctor about all of the medicines that you take. This information is not intended to replace advice given to you by your health care provider. Make sure you discuss any questions you have with your health care provider. Document Released: 07/10/2011 Document Revised: 03/30/2017 Document Reviewed: 02/09/2017 Elsevier Patient Education  2020 [employer].

## 2019-03-07 NOTE — Progress Notes (Addendum)
Name: Shirley Lowe   MRN: [id number]    DOB: August 08, 1950   Date:03/07/2019       Progress Note  Subjective  Chief Complaint  Chief Complaint  Patient presents with  . Follow-up    discuss options for Bone Density treatment    HPI  Pt presents to discuss Bone Density Treatment.  She took 3 years of fossamax several years ago (early 2000's).  Was told this was not working well for her, so she was switched to an infusion - not sure of which.  She was taking calcium supplement at one point, but stopped this as well.  She notes that her thyroid was checked in the past, but not parathyroid hormone.  Prior CMP does not show hypercalcemia.   She has strong family history of osteoporosis.  One of her sister's has had jaw issues since taking fossamax.  At this point, if labs are normal except for vitamin D levels, she wants to wait on medication and take calcium/vitamin D supplement and recheck in 2 years.   UC: She is seeing GI in [place], needs someone in [place] - we will send new referral.  She notes that she is almost out of her Lialda - we will provide temporary refill today.  No blood in stool, no abdominal pain; occasional diarrhea.   Palpitations: She notes history of palpitations for years, but more frequent over the last several months - episodes last a few minutes and feel like her heart is racing, then goes away.  She has never seen cardiology, but has had a cardiology eval about 3 years ago and was told things looked normal.  Declines referral today, however we will check TSH and CMP today, and will monitor her symptoms - if worsening, she will notify us.  Patient Active Problem List   Diagnosis Date Noted  . Osteoporosis 07/17/2017  . Lipodermatosclerosis 07/17/2017  . Urinary incontinence in female 07/17/2017  . 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 Obesity (BMI 30-39.9) 07/17/2017    Social History   Tobacco Use  . Smoking status: Never  Smoker  . Smokeless tobacco: Never Used  . Tobacco comment: smoking cessation materials not required  Substance Use Topics  . Alcohol use: No     Current Outpatient Medications:  .  Ascorbic Acid (VITAMIN C) 1000 MG tablet, Take 1,000 mg by mouth daily., Disp: , Rfl:  .  b complex vitamins tablet, Take 1 tablet by mouth daily., Disp: , Rfl:  .  Calcium Carbonate-Vitamin D (CALTRATE 600+D PO), Take 2 tablets by mouth daily., Disp: , Rfl:  .  clobetasol cream (TEMOVATE) 8.29 %, Apply 1 application topically at bedtime., Disp: 30 g, Rfl: 5 .  erythromycin ophthalmic ointment, Place 1 application into the left eye 3 (three) times daily., Disp: 3.5 g, Rfl: 0 .  mesalamine (LIALDA) 1.2 g EC tablet, Take 2.4 g by mouth daily., Disp: , Rfl:  .  Omega-3 Fatty Acids (FISH OIL) 1000 MG CAPS, Take 1 capsule by mouth daily., Disp: , Rfl:  .  vitamin E 100 UNIT capsule, Take 100 Units by mouth daily., Disp: , Rfl:   Allergies  Allergen Reactions  . Penicillins     Has yeast infection    I personally reviewed active problem list, medication list, allergies, health maintenance, notes from last encounter, lab results with the patient/caregiver today.  ROS  Ten systems reviewed and is negative except as mentioned in HPI  Objective  Vitals:  03/07/19 1333  BP: 118/76  Pulse: 85  Resp: 14  Temp: (!) 97.5 F (36.4 C)  TempSrc: Temporal  SpO2: 93%  Weight: 182 lb 3.2 oz (82.6 kg)  Height: 5' 4"  (1.626 m)    Body mass index is 31.27 kg/m.  Nursing Note and Vital Signs reviewed.  Physical Exam  Constitutional: Patient appears well-developed and well-nourished. No distress.  HENT: Head: Normocephalic and atraumatic. Eyes: Conjunctivae and EOM are normal. No scleral icterus.   Neck: Normal range of motion. Neck supple. No JVD present. Cardiovascular: Normal rate, regular rhythm and normal heart sounds.  No murmur heard. No BLE edema. Pulmonary/Chest: Effort normal and breath sounds  normal. No respiratory distress. Musculoskeletal: Normal range of motion, no joint effusions. No gross deformities Neurological: Pt is alert and oriented to person, place, and time. No cranial nerve deficit. Coordination, balance, strength, speech and gait are normal.  Skin: Skin is warm and dry. No rash noted. No erythema.  Psychiatric: Patient has a normal mood and affect. behavior is normal. Judgment and thought content normal.   No results found for this or any previous visit (from the past 72 hour(s)).  Assessment & Plan  1. Age-related osteoporosis without current pathological fracture - Would like to wait on medication for now, maximize vitamin d and calcium after labs are back and recheck in 2 years.  High impact and weight bearing exercises, along with avoiding falls and having safety features in home to prevent falls.  - Thyroid Panel With TSH - PTH, intact and calcium - Comprehensive metabolic panel - VITAMIN D 25 Hydroxy (Vit-D Deficiency, Fractures)  2. 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  Dispense: 90 tablet; Refill: 0  3. Palpitation - Declines referral today.  Will keep monitoring her symptoms, red flags discussed in detail for Emergency Care.

## 2019-03-08 LAB — COMPREHENSIVE METABOLIC PANEL
ALT: 29 IU/L (ref 0–32)
AST: 21 IU/L (ref 0–40)
Albumin/Globulin Ratio: 1.5 (ref 1.2–2.2)
Albumin: 4.4 g/dL (ref 3.8–4.8)
Alkaline Phosphatase: 71 IU/L (ref 39–117)
BUN/Creatinine Ratio: 27 (ref 12–28)
BUN: 19 mg/dL (ref 8–27)
Bilirubin Total: 0.5 mg/dL (ref 0.0–1.2)
CO2: 23 mmol/L (ref 20–29)
Calcium: 9.8 mg/dL (ref 8.7–10.3)
Chloride: 100 mmol/L (ref 96–106)
Creatinine, Ser: 0.7 mg/dL (ref 0.57–1.00)
GFR calc Af Amer: 102 mL/min/{1.73_m2} (ref >59)
GFR calc non Af Amer: 89 mL/min/{1.73_m2} (ref >59)
Globulin, Total: 3 g/dL (ref 1.5–4.5)
Glucose: 84 mg/dL (ref 65–99)
Potassium: 4.5 mmol/L (ref 3.5–5.2)
Sodium: 141 mmol/L (ref 134–144)
Total Protein: 7.4 g/dL (ref 6.0–8.5)

## 2019-03-08 LAB — THYROID PANEL WITH TSH
Free Thyroxine Index: 1.6 (ref 1.2–4.9)
T3 Uptake Ratio: 22 % — ABNORMAL LOW (ref 24–39)
T4, Total: 7.3 ug/dL (ref 4.5–12.0)
TSH: 2.02 u[IU]/mL (ref 0.450–4.500)

## 2019-03-08 LAB — PTH, INTACT AND CALCIUM: PTH: 27 pg/mL (ref 15–65)

## 2019-03-08 LAB — VITAMIN D 25 HYDROXY (VIT D DEFICIENCY, FRACTURES): Vit D, 25-Hydroxy: 23.9 ng/mL — ABNORMAL LOW (ref 30.0–100.0)

## 2019-03-10 ENCOUNTER — Other Ambulatory Visit: Payer: Self-pay | Admitting: Family Medicine

## 2019-03-10 DIAGNOSIS — R7989 Other specified abnormal findings of blood chemistry: Secondary | ICD-10-CM | POA: Insufficient documentation

## 2019-03-10 DIAGNOSIS — M810 Age-related osteoporosis without current pathological fracture: Secondary | ICD-10-CM

## 2019-03-10 MED ORDER — VITAMIN D (ERGOCALCIFEROL) 1.25 MG (50000 UNIT) PO CAPS: 50000.0000 [IU] | ORAL_CAPSULE | ORAL | 0 refills | Status: DC

## 2019-04-02 ENCOUNTER — Other Ambulatory Visit: Payer: Self-pay | Admitting: Family Medicine

## 2019-04-02 DIAGNOSIS — R7989 Other specified abnormal findings of blood chemistry: Secondary | ICD-10-CM

## 2019-04-02 DIAGNOSIS — M810 Age-related osteoporosis without current pathological fracture: Secondary | ICD-10-CM

## 2019-04-02 NOTE — Telephone Encounter (Signed)
Please advise patient to switch over to 2000IU daily of vitamin D after finishing her once weekly dosing.

## 2019-04-02 NOTE — Telephone Encounter (Signed)
Patient notified. Stated she had 1 week left of 50,000 unit Vitamin D. Will get 2000 unit otc and start next week

## 2019-04-16 ENCOUNTER — Ambulatory Visit: Payer: Medicare HMO | Admitting: Gastroenterology

## 2019-04-16 ENCOUNTER — Encounter: Payer: Self-pay | Admitting: Gastroenterology

## 2019-04-16 ENCOUNTER — Other Ambulatory Visit: Payer: Self-pay

## 2019-04-16 ENCOUNTER — Encounter (INDEPENDENT_AMBULATORY_CARE_PROVIDER_SITE_OTHER): Payer: Self-pay

## 2019-04-16 VITALS — BP 126/78 | HR 77 | Temp 97.8°F | Wt 184.1 lb

## 2019-04-16 DIAGNOSIS — Z8719 Personal history of other diseases of the digestive system: Secondary | ICD-10-CM | POA: Diagnosis not present

## 2019-04-16 NOTE — Progress Notes (Addendum)
Shirley Lowe [address]  [place], [place] [address]  Main: [phone]  Fax: [phone]   Gastroenterology Consultation  Referring Provider:     Hubbard Hartshorn, FNP Primary Care Physician:  Hubbard Hartshorn, FNP Reason for Consultation:     Ulcerative colitis        HPI:    Chief Complaint  Patient presents with   Ulcerative Colitis    Shirley Lowe is a 68 y.o. y/o female referred for consultation & management  by Dr. Uvaldo Rising, Astrid Divine, FNP.  Patient states she was diagnosed with ulcerative colitis about 12 years ago in [place].  At that time she had rectal bleeding and colonoscopy was done by her gastroenterologist there, with a few polyps removed and inflammation seen in the colon.  Patient states she was placed on Asacol and has been in clinical remission since then.  Had another colonoscopy in [place] and states was in clinical remission at that time as well.  Last colonoscopy was in 2017 in [place] and states that showed remission as well.  Was changed from Asacol to Lialda and has been taking 2.5 g daily and maintain that 2-3 formed bowel movements daily without blood.  The patient denies abdominal or flank pain, anorexia, nausea or vomiting, dysphagia, change in bowel habits or black or bloody stools or weight loss.  After moving from [place] to [facility], has been seen by Dr. Watt Climes at [facility].  States last was seen about a year to year and a half ago.  Is transferring care to [facility] due to proximity.  No family history of colon cancer.  Does report an upper endoscopy, possibly with her second colonoscopy in [place] and recalls having reflux at the time.  Reports having choking spells about once or twice a week with solid foods only.  Takes small bites due to this and does well with that.  No episodes of food impaction.  Does not think food gets hung or stuck after she swallows.  States this has been an issue  since she was a child.  Past Medical History:  Diagnosis Date   Osteoporosis    Ulcerative colitis     Past Surgical History:  Procedure Laterality Date   ABDOMINAL HYSTERECTOMY     INCONTINENCE SURGERY     TONSILLECTOMY      Prior to Admission medications   Medication Sig Start Date End Date Taking? Authorizing Provider  Ascorbic Acid (VITAMIN C) 1000 MG tablet Take 1,000 mg by mouth daily.   Yes [provider]  b complex vitamins tablet Take 1 tablet by mouth daily.   Yes [provider]  clobetasol cream (TEMOVATE) 0.10 % Apply 1 application topically at bedtime. 07/17/17  Yes [name], [name], MD  erythromycin ophthalmic ointment Place 1 application into the left eye 3 (three) times daily. 09/12/18  Yes Hubbard Hartshorn, FNP  mesalamine (LIALDA) 1.2 g EC tablet Take 2 tablets (2.4 g total) by mouth daily. 03/07/19  Yes Hubbard Hartshorn, FNP  Omega-3 Fatty Acids (FISH OIL) 1000 MG CAPS Take 1 capsule by mouth daily.   Yes [provider]  Vitamin D, Ergocalciferol, (DRISDOL) 1.25 MG (50000 UT) CAPS capsule Take 1 capsule (50,000 Units total) by mouth every 7 (seven) days. After completion, take 2000IU OTC Vit D3 once daily 03/10/19  Yes Hubbard Hartshorn, FNP  vitamin E 100 UNIT capsule Take 100 Units by mouth daily.   Yes [provider]    Family History  Problem Relation Age of Onset   Cancer Mother        breast cancer   Breast cancer Mother 49       metastic   Cancer Father        pancreatic   Hepatitis C Sister    Thyroid disease Sister    Irritable bowel syndrome Sister    Allergies Sister      Social History   Tobacco Use   Smoking status: Never Smoker   Smokeless tobacco: Never Used   Tobacco comment: smoking cessation materials not required  Substance Use Topics   Alcohol use: No   Drug use: No    Allergies as of 04/16/2019 - Review Complete 04/16/2019  Allergen Reaction Noted   Penicillins  05/01/2011     Review of Systems:    All systems reviewed and negative except where noted in HPI.   Physical Exam:  BP 126/78 (BP Location: Left Arm, Patient Position: Sitting, Cuff Size: Normal)    Pulse 77    Temp 97.8 F (36.6 C) (Oral)    Wt 184 lb 2 oz (83.5 kg)    BMI 31.60 kg/m  No LMP recorded. Patient has had a hysterectomy. Psych:  Alert and cooperative. Normal mood and affect. General:   Alert,  Well-developed, well-nourished, pleasant and cooperative in NAD Head:  Normocephalic and atraumatic. Eyes:  Sclera clear, no icterus.   Conjunctiva pink. Ears:  Normal auditory acuity. Nose:  No deformity, discharge, or lesions. Mouth:  No deformity or lesions,oropharynx pink & moist. Neck:  Supple; no masses or thyromegaly. Abdomen:  Normal bowel sounds.  No bruits.  Soft, non-tender and non-distended without masses, hepatosplenomegaly or hernias noted.  No guarding or rebound tenderness.    Msk:  Symmetrical without gross deformities. Good, equal movement & strength bilaterally. Pulses:  Normal pulses noted. Extremities:  No clubbing or edema.  No cyanosis. Neurologic:  Alert and oriented x3;  grossly normal neurologically. Skin:  Intact without significant lesions or rashes. No jaundice. Lymph Nodes:  No significant cervical adenopathy. Psych:  Alert and cooperative. Normal mood and affect.   Labs: CBC No results found for: WBC, RBC, HGB, HCT, PLT, MCV, MCH, MCHC, RDW, LYMPHSABS, MONOABS, EOSABS, BASOSABS CMP     Component Value Date/Time   NA 141 03/07/2019 1508   K 4.5 03/07/2019 1508   CL 100 03/07/2019 1508   CO2 23 03/07/2019 1508   GLUCOSE 84 03/07/2019 1508   BUN 19 03/07/2019 1508   CREATININE 0.70 03/07/2019 1508   CALCIUM 9.8 03/07/2019 1508   PROT 7.4 03/07/2019 1508   ALBUMIN 4.4 03/07/2019 1508   AST 21 03/07/2019 1508   ALT 29 03/07/2019 1508   ALKPHOS 71 03/07/2019 1508   BILITOT 0.5 03/07/2019 1508   GFRNONAA 89 03/07/2019 1508   GFRAA 102 03/07/2019 1508     Imaging Studies: No results found.  Assessment and Plan:   Keshia Weare is a 68 y.o. y/o female has been referred for history of ulcerative colitis  We do not have any of her previous records.  Record release will be sent to [facility] and [facility]  Once obtained, we can review it and determine further plan of care  If her ulcerative colitis was not just limited to her rectum, she would be due for surveillance biopsies at this time  After reviewing her previous upper endoscopy, he will discuss it with to see if she would also  like an upper endoscopy given her Intermittent "choking episodes".  Continue small bites, followed by liquids.  No episodes of food impaction.  Patient has refills for her mesalamine, 2.4 g once daily  CMP recently normal  We will check CBC and CRP  Addendum: Previous records obtained Last seen by [facility] in April 2020 and therefore the patient is on mesalamine 2 a day and due for colonoscopy.  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 Provider Dr. Lonia Skinner.  7 mm polyp removed from the cecum with hot biopsy forceps.  Multiple diverticuli from sigmoid to splenic flexure.  Internal hemorrhoids, small.  4 biopsies taken every 10 cm from cecum to rectum for ulcerative colitis surveillance.  Repeat recommended in 2 years for surveillance.  Pathology report not available.  Dr Shirley Lowe  Speech recognition software was used to dictate the above note.

## 2019-04-17 ENCOUNTER — Telehealth: Payer: Self-pay

## 2019-04-17 LAB — CBC
Hematocrit: 43.9 % (ref 34.0–46.6)
Hemoglobin: 14.3 g/dL (ref 11.1–15.9)
MCH: 28.3 pg (ref 26.6–33.0)
MCHC: 32.6 g/dL (ref 31.5–35.7)
MCV: 87 fL (ref 79–97)
Platelets: 318 10*3/uL (ref 150–450)
RBC: 5.06 x10E6/uL (ref 3.77–5.28)
RDW: 13.4 % (ref 11.7–15.4)
WBC: 6 10*3/uL (ref 3.4–10.8)

## 2019-04-17 LAB — C-REACTIVE PROTEIN: CRP: 6 mg/L (ref 0–10)

## 2019-04-17 NOTE — Telephone Encounter (Signed)
Patient verbalized understanding  

## 2019-04-17 NOTE — Telephone Encounter (Signed)
-----   Message from Virgel Manifold, MD sent at 04/17/2019  2:26 PM EST ----- Caryl Pina please let the patient know, her labs were normal.  We're awaiting her previous records

## 2019-05-09 ENCOUNTER — Encounter: Payer: Self-pay | Admitting: Gastroenterology

## 2019-05-14 ENCOUNTER — Telehealth: Payer: Self-pay

## 2019-05-14 NOTE — Telephone Encounter (Signed)
-----   Message from Virgel Manifold, MD sent at 05/14/2019  2:49 PM EST ----- Please let her know that we have obtained her records from [place].  Her last colonoscopy was in 2017 and a repeat was recommended in 2 years due to her history of ulcerative colitis.  She is now due for her colonoscopy for "ulcerative colitis surveillance", and also an EGD for dysphagia.  If she is willing to schedule, please schedule or placed on wait list with any of our providers.  Please ensure she has clinic follow-up recall in 3 months if not already scheduled

## 2019-05-14 NOTE — Telephone Encounter (Signed)
Called and left a message for call back. Put patient on a 3 month clinic recall list

## 2019-05-15 NOTE — Telephone Encounter (Signed)
Patient only wants Dr. Bonna Gains to do her procedure. Patient states she will Wait till May when she returns to have the procedures done

## 2019-05-15 NOTE — Telephone Encounter (Signed)
Called and left a message for husband for call back

## 2019-05-15 NOTE — Telephone Encounter (Addendum)
Put patient on recall lisit

## 2019-05-28 ENCOUNTER — Other Ambulatory Visit: Payer: Self-pay | Admitting: Family Medicine

## 2019-05-28 DIAGNOSIS — M810 Age-related osteoporosis without current pathological fracture: Secondary | ICD-10-CM

## 2019-05-28 DIAGNOSIS — R7989 Other specified abnormal findings of blood chemistry: Secondary | ICD-10-CM

## 2019-07-01 ENCOUNTER — Ambulatory Visit: Payer: Medicare HMO

## 2019-07-01 ENCOUNTER — Ambulatory Visit: Payer: Medicare HMO | Attending: Internal Medicine

## 2019-07-02 LAB — NOVEL CORONAVIRUS, NAA: SARS-CoV-2, NAA: NOT DETECTED

## 2019-08-22 ENCOUNTER — Other Ambulatory Visit: Payer: Self-pay | Admitting: Family Medicine

## 2019-08-22 DIAGNOSIS — K51311 Ulcerative (chronic) rectosigmoiditis with rectal bleeding: Secondary | ICD-10-CM

## 2019-08-22 NOTE — Telephone Encounter (Signed)
Requested medication (s) are due for refill today: yes  Requested medication (s) are on the active medication list: yes  Last refill: 03/07/2019  Future visit scheduled: no  Notes to clinic:  not delegated   Requested Prescriptions  Pending Prescriptions Disp Refills   mesalamine (LIALDA) 1.2 g EC tablet 90 tablet 0    Sig: Take 2 tablets (2.4 g total) by mouth daily.      Not Delegated - Gastroenterology:  Inflammatory Bowel Disease (Oral) Failed - 08/22/2019 11:11 AM      Failed - This refill cannot be delegated      Passed - Cr in normal range and within 180 days    Creatinine, Ser  Date Value Ref Range Status  03/07/2019 0.70 0.57 - 1.00 mg/dL Final          Passed - AST in normal range and within 180 days    AST  Date Value Ref Range Status  03/07/2019 21 0 - 40 IU/L Final          Passed - ALT in normal range and within 180 days    ALT  Date Value Ref Range Status  03/07/2019 29 0 - 32 IU/L Final          Passed - WBC in normal range and within 180 days    WBC  Date Value Ref Range Status  04/16/2019 6.0 3.4 - 10.8 x10E3/uL Final          Passed - HGB in normal range and within 180 days    Hemoglobin  Date Value Ref Range Status  04/16/2019 14.3 11.1 - 15.9 g/dL Final          Passed - HCT in normal range and within 180 days    Hematocrit  Date Value Ref Range Status  04/16/2019 43.9 34.0 - 46.6 % Final          Passed - PLT in normal range and within 180 days    Platelets  Date Value Ref Range Status  04/16/2019 318 150 - 450 x10E3/uL Final          Passed - Valid encounter within last 6 months    Recent Outpatient Visits           5 months ago Age-related osteoporosis without current pathological fracture   [facility] Hubbard Hartshorn, FNP   11 months ago Hordeolum externum of left upper eyelid   [place], FNP   2 years ago Age-related osteoporosis without current pathological  fracture   [facility] [name], [name], MD

## 2019-08-22 NOTE — Telephone Encounter (Signed)
Pt called in to request a refill for mesalamine (LIALDA) 1.2 g EC tablet - 90 day supply  [facility]  [id number]  [phone] Fax:  [phone]

## 2019-08-22 NOTE — Telephone Encounter (Signed)
lvm for pt to return call to sch appt, in office or virtual

## 2019-08-22 NOTE — Telephone Encounter (Signed)
Please schedule patient a follow up appointment virtual or come in for refills

## 2019-08-26 ENCOUNTER — Telehealth: Payer: Self-pay | Admitting: Family Medicine

## 2019-08-26 DIAGNOSIS — K51311 Ulcerative (chronic) rectosigmoiditis with rectal bleeding: Secondary | ICD-10-CM

## 2019-08-26 NOTE — Telephone Encounter (Signed)
Medication Refill - Medication: mesalamine (LIALDA) 1.2 g EC tablet    Has the patient contacted their pharmacy? No.     Preferred Pharmacy (with phone number or street name):  [id number]  Agent: Please be advised that RX refills may take up to 3 business days. We ask that you follow-up with your pharmacy.

## 2019-08-26 NOTE — Telephone Encounter (Signed)
Refill has expire please verify for use and refill

## 2019-08-28 ENCOUNTER — Ambulatory Visit (INDEPENDENT_AMBULATORY_CARE_PROVIDER_SITE_OTHER): Payer: Medicare HMO | Admitting: Family Medicine

## 2019-08-28 ENCOUNTER — Telehealth: Payer: Self-pay | Admitting: Gastroenterology

## 2019-08-28 ENCOUNTER — Encounter: Payer: Self-pay | Admitting: Family Medicine

## 2019-08-28 ENCOUNTER — Other Ambulatory Visit: Payer: Self-pay

## 2019-08-28 DIAGNOSIS — K5190 Ulcerative colitis, unspecified, without complications: Secondary | ICD-10-CM

## 2019-08-28 DIAGNOSIS — M810 Age-related osteoporosis without current pathological fracture: Secondary | ICD-10-CM

## 2019-08-28 DIAGNOSIS — R7989 Other specified abnormal findings of blood chemistry: Secondary | ICD-10-CM

## 2019-08-28 MED ORDER — VITAMIN D 50 MCG (2000 UT) PO CAPS: 1.0000 | ORAL_CAPSULE | Freq: Every day | ORAL | 0 refills | Status: DC

## 2019-08-28 MED ORDER — MESALAMINE 1.2 G PO TBEC: 1.2000 g | DELAYED_RELEASE_TABLET | Freq: Every day | ORAL | 1 refills | Status: DC

## 2019-08-28 MED ORDER — RALOXIFENE HCL 60 MG PO TABS: 60.0000 mg | ORAL_TABLET | Freq: Every day | ORAL | 12 refills | Status: DC

## 2019-08-28 NOTE — Telephone Encounter (Signed)
See if she can do a virtual with sowles now

## 2019-08-28 NOTE — Telephone Encounter (Signed)
Pt sch'd telephone visit with Dr Ancil Boozer

## 2019-08-28 NOTE — Telephone Encounter (Signed)
Patient was contacted to let her know that her Doristine Johns was refilled and that Dr. Bonna Gains wanted to see her in 2-3 weeks. Patient agreed but would like to have a televisit. This was okayed by Dr. Bonna Gains per secured chat. Appointment is scheduled to be on 09/11/2019 at 1:30 PM.

## 2019-08-28 NOTE — Progress Notes (Signed)
Name: Shirley Lowe   MRN: [id number]    DOB: 10-18-1950   Date:08/28/2019       Progress Note  Subjective  Chief Complaint  Chief Complaint  Patient presents with  . Medication Refill    I connected with  Shirley Lowe on 08/28/19 at 11:20 AM EDT by telephone and verified that I am speaking with the correct person using two identifiers.  I discussed the limitations, risks, security and privacy concerns of performing an evaluation and management service by telephone and the availability of in person appointments. Staff also discussed with the patient that there may be a patient responsible charge related to this service. Patient Location: at home  Provider Location: [facility]  HPI  Ulcerative colitis : last visit with Dr. Bonna Lowe was 04/16/2019 , last colonoscopy was done 07/15/2015 by Dr. Loretha Lowe and negative for active disease . She is compliant with medication taking two of the 1.2 mg daily . She denies any abdominal pain, rectal bleeding, weight loss.  Dysphagia: she states symptoms have been stable, she is due to follow up , but is waiting until July 2021 after her grandchildren live   Osteoporosis: she took Alendronate in the past but stopped working, she also took an infusion ( unknown name but likely Prolia ) for about one year She states she has 3 sister and 2 of them have osteoporosis. Discussed Evista , Reclast and Forteo  The BMD measured at AP Spine L1-L4 is 0.843 g/cm2 with a T-score of -2.8. This patient is considered osteoporotic according to [facility]) criteria. The scan quality is good.  Site Region Measured Measured WHO Young Adult BMD Date       Age      Classification T-score AP Spine L1-L4 02/26/2019 69.7 Osteoporosis -2.8 0.843 g/cm2  DualFemur Neck Left 02/26/2019 69.8 Osteopenia -1.6 0.821 g/cm2  DualFemur Total Mean 02/26/2019 69.8 Osteopenia -1.1 0.867 g/cm2   Patient Active Problem List   Diagnosis Date Noted  . Low  vitamin D level 03/10/2019  . Osteoporosis 07/17/2017  . Lipodermatosclerosis 07/17/2017  . Urinary incontinence in female 07/17/2017  . 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 Obesity (BMI 30-39.9) 07/17/2017    Past Surgical History:  Procedure Laterality Date  . ABDOMINAL HYSTERECTOMY    . INCONTINENCE SURGERY    . TONSILLECTOMY      Family History  Problem Relation Age of Onset  . Cancer Mother        breast cancer  . Breast cancer Mother 80       metastic  . Cancer Father        pancreatic  . Hepatitis C Sister   . Thyroid disease Sister   . Irritable bowel syndrome Sister   . Allergies Sister     Social History   Socioeconomic History  . Marital status: Married    Spouse name: Shirley Lowe Manner  . Number of children: 3  . Years of education: Not on file  . Highest education level: Bachelor's degree (e.g., BA, AB, BS)  Occupational History  . Occupation: Retired  Tobacco Use  . Smoking status: Never Smoker  . Smokeless tobacco: Never Used  . Tobacco comment: smoking cessation materials not required  Substance and Sexual Activity  . Alcohol use: No  . Drug use: No  . Sexual activity: Not Currently  Other Topics Concern  . Not on file  Social History Narrative  . Not on file   Social Determinants of Health  Financial Resource Strain:   . Difficulty of Paying Living Expenses:   Food Insecurity:   . Worried About Charity fundraiser in the Last Year:   . Arboriculturist in the Last Year:   Transportation Needs:   . Film/video editor (Medical):   Marland Kitchen Lack of Transportation (Non-Medical):   Physical Activity:   . Days of Exercise per Week:   . Minutes of Exercise per Session:   Stress:   . Feeling of Stress :   Social Connections: Unknown  . Frequency of Communication with Friends and Family: More than three times a week  . Frequency of Social Gatherings with Friends and Family: Twice a week  . Attends Religious Services: More  than 4 times per year  . Active Member of Clubs or Organizations: No  . Attends Archivist Meetings: Never  . Marital Status: Not on file  Intimate Partner Violence:   . Fear of Current or Ex-Partner:   . Emotionally Abused:   Marland Kitchen Physically Abused:   . Sexually Abused:      Current Outpatient Medications:  .  Ascorbic Acid (VITAMIN C) 1000 MG tablet, Take 1,000 mg by mouth daily., Disp: , Rfl:  .  b complex vitamins tablet, Take 1 tablet by mouth daily., Disp: , Rfl:  .  clobetasol cream (TEMOVATE) 2.72 %, Apply 1 application topically at bedtime., Disp: 30 g, Rfl: 5 .  erythromycin ophthalmic ointment, Place 1 application into the left eye 3 (three) times daily., Disp: 3.5 g, Rfl: 0 .  mesalamine (LIALDA) 1.2 g EC tablet, Take 2 tablets (2.4 g total) by mouth daily., Disp: 90 tablet, Rfl: 0 .  Omega-3 Fatty Acids (FISH OIL) 1000 MG CAPS, Take 1 capsule by mouth daily., Disp: , Rfl:  .  Vitamin D, Ergocalciferol, (DRISDOL) 1.25 MG (50000 UT) CAPS capsule, Take 1 capsule (50,000 Units total) by mouth every 7 (seven) days. After completion, take 2000IU OTC Vit D3 once daily, Disp: 4 capsule, Rfl: 0 .  vitamin E 100 UNIT capsule, Take 100 Units by mouth daily., Disp: , Rfl:   Allergies  Allergen Reactions  . Penicillins     Has yeast infection    I personally reviewed active problem list, medication list, allergies, family history, social history, health maintenance with the patient/caregiver today.   ROS  Ten systems reviewed and is negative except as mentioned in HPI   Objective  Virtual encounter, vitals not obtained.  There is no height or weight on file to calculate BMI.  Physical Exam  Awake, alert and oriented   PHQ2/9: Depression screen [facility] 2/9 08/28/2019 03/07/2019 09/19/2018 09/12/2018 09/13/2017  Decreased Interest 0 0 0 0 0  Down, Depressed, Hopeless 0 0 0 0 0  PHQ - 2 Score 0 0 0 0 0  Altered sleeping 0 0 1 1 -  Tired, decreased energy 0 0 0 0 -    Change in appetite 0 0 0 0 -  Feeling bad or failure about yourself  0 0 0 0 -  Trouble concentrating 0 0 0 0 -  Moving slowly or fidgety/restless 0 0 0 0 -  Suicidal thoughts 0 0 0 0 -  PHQ-9 Score 0 0 1 1 -  Difficult doing work/chores Not difficult at all Not difficult at all Not difficult at all Not difficult at all -   PHQ-2/9 Result is negative.    Fall Risk: Fall Risk  08/28/2019 03/07/2019 09/19/2018 09/12/2018 09/13/2017  Falls  in the past year? 0 0 0 0 No  Number falls in past yr: 0 0 0 0 -  Injury with Fall? 0 0 0 0 -  Risk for fall due to : - - - - Impaired vision  Risk for fall due to: Comment - - - - wears glasses, early signs of cataracts  Follow up Falls evaluation completed Falls evaluation completed Falls prevention discussed Follow up appointment -     Assessment & Plan  1. Ulcerative colitis without complications, unspecified location [facility])  Advised to contact GI office   2. Age-related osteoporosis without current pathological fracture  - raloxifene (EVISTA) 60 MG tablet; Take 1 tablet (60 mg total) by mouth daily.  Dispense: 30 tablet; Refill: 12 Discussed possible side effects  3. Low vitamin D level  I discussed the assessment and treatment plan with the patient. The patient was provided an opportunity to ask questions and all were answered. The patient agreed with the plan and demonstrated an understanding of the instructions.   The patient was advised to call back or seek an in-person evaluation if the symptoms worsen or if the condition fails to improve as anticipated.  I provided 25  minutes of non-face-to-face time during this encounter.  Loistine Chance, MD

## 2019-08-29 ENCOUNTER — Ambulatory Visit: Payer: Medicare HMO | Admitting: Family Medicine

## 2019-09-01 ENCOUNTER — Other Ambulatory Visit: Payer: Self-pay | Admitting: Gastroenterology

## 2019-09-01 MED ORDER — MESALAMINE 1.2 G PO TBEC: 1.2000 g | DELAYED_RELEASE_TABLET | Freq: Two times a day (BID) | ORAL | 1 refills | Status: DC

## 2019-09-01 NOTE — Telephone Encounter (Signed)
Patient was contacted letting her know that [facility]. Patient understood and had no further questions.

## 2019-09-01 NOTE — Telephone Encounter (Signed)
Mesalamine 1.2 g (resend directions should be twice a day) [facility]

## 2019-09-08 ENCOUNTER — Encounter: Payer: Self-pay | Admitting: Gastroenterology

## 2019-09-08 NOTE — Progress Notes (Signed)
January 2007 colonoscopy done for blood in stool and rectal bleeding: Mild localized inflammation in the proximal ascending colon Single diverticulum at the hepatic flexure Internal hemorrhoids Multiple diverticuli from sigmoid to descending colon  January 2007 biopsies, labeled ascending colon biopsy: Changes consistent with chronic active colitis  October 2008 pathology: Diffuse severe chronic active colitis in the rectum, sigmoid, descending colon, ascending colon and cecum biopsies.  October 2008 upper GI with small bowel follow-through reported mild to moderate gastroesophageal reflux  December 2008 clinic note:  Doing well on Asacol since October 2008 Diagnosis with ulcerative colitis, stable, continue Asacol 400 mg x 2, 3 times a day  March 2017 clinic [facility] from [facility] "History of UC, does well with Lialda.  Overall feeling well.  Bowel movements are regular, formed.  Continue Lialda.  Labs ordered.  Colonoscopy rescheduled.  Lialda 1.2 g, 2 tabs once daily were ordered  March 2017 colonoscopy 7 mm polyp found in the cecum, removed with hot biopsy forceps.  Multiple diverticula.  Internal hemorrhoids.  4 biopsies taken every 10 cm. March 2017 pathology Unremarkable colonic mucosa in the cecum, ascending colon, transverse colon, descending colon jars.  The sigmoid and rectum jar reports mucosal architectural alterations consistent with colitis and UC.  Recommendations were to repeat colonoscopy in 2 years for surveillance.

## 2019-09-11 ENCOUNTER — Encounter: Payer: Self-pay | Admitting: Gastroenterology

## 2019-09-11 ENCOUNTER — Other Ambulatory Visit: Payer: Self-pay

## 2019-09-11 ENCOUNTER — Telehealth (INDEPENDENT_AMBULATORY_CARE_PROVIDER_SITE_OTHER): Payer: Medicare HMO | Admitting: Gastroenterology

## 2019-09-11 DIAGNOSIS — Z8719 Personal history of other diseases of the digestive system: Secondary | ICD-10-CM | POA: Diagnosis not present

## 2019-09-11 NOTE — Progress Notes (Signed)
Shirley Antigua, MD [address]  [place]  [place], [place] [address]  Main: [phone]  Fax: [phone]   Primary Care Physician: Hubbard Hartshorn, FNP  Virtual Visit via Telephone Note  I connected with patient on 09/11/19 at  1:30 PM EDT by telephone and verified that I am speaking with the correct person using two identifiers.   I discussed the limitations, risks, security and privacy concerns of performing an evaluation and management service by telephone and the availability of in person appointments. I also discussed with the patient that there may be a patient responsible charge related to this service. The patient expressed understanding and agreed to proceed.  Location of Patient: Home Location of Provider: Home Persons involved: Patient and provider only during the visit (nursing staff and front desk staff was involved in communicating with the patient prior to the appointment, reviewing medications and checking them in)   History of Present Illness: Chief Complaint  Patient presents with  . History of ulcerative colitis     HPI: Shirley Lowe is a 69 y.o. female with history of UC here for follow-up.  Patient reports 2-3 formed bowel movement today, with no blood, abdominal pain, diarrhea.  No weight loss no fever chills.  Taking Lialda 2.4 g daily.  Last colonoscopy was in March 2017 and 2-year surveillance is now due.  Previous history:  January 2007 colonoscopy done for blood in stool and rectal bleeding: Mild localized inflammation in the proximal ascending colon Single diverticulum at the hepatic flexure Internal hemorrhoids Multiple diverticuli from sigmoid to descending colon  January 2007 biopsies, labeled ascending colon biopsy: Changes consistent with chronic active colitis  October 2008 pathology: Diffuse severe chronic active colitis in the rectum, sigmoid, descending colon, ascending colon and cecum biopsies.  October 2008 upper GI  with small bowel follow-through reported mild to moderate gastroesophageal reflux  December 2008 clinic note:  Doing well on Asacol since October 2008 Diagnosis with ulcerative colitis, stable, continue Asacol 400 mg x 2, 3 times a day  March 2017 clinic note from [facility] "History of UC, does well with Lialda.  Overall feeling well.  Bowel movements are regular, formed.  Continue Lialda.  Labs ordered.  Colonoscopy rescheduled.  Lialda 1.2 g, 2 tabs once daily were ordered  March 2017 colonoscopy 7 mm polyp found in the cecum, removed with hot biopsy forceps.  Multiple diverticula.  Internal hemorrhoids.  4 biopsies taken every 10 cm. March 2017 pathology Unremarkable colonic mucosa in the cecum, ascending colon, transverse colon, descending colon jars.  The sigmoid and rectum jar reports mucosal architectural alterations consistent with colitis and UC.  Recommendations were to repeat colonoscopy in 2 years for surveillance.  Current Outpatient Medications  Medication Sig Dispense Refill  . Ascorbic Acid (VITAMIN C) 1000 MG tablet Take 1,000 mg by mouth daily.    Marland Kitchen b complex vitamins tablet Take 1 tablet by mouth daily.    . Cholecalciferol (VITAMIN D) 50 MCG (2000 UT) CAPS Take 1 capsule (2,000 Units total) by mouth daily. 30 capsule 0  . clobetasol cream (TEMOVATE) 3.88 % Apply 1 application topically at bedtime. 30 g 5  . mesalamine (LIALDA) 1.2 g EC tablet Take 2 tablets (2.4 g total) by mouth daily. 90 tablet 0  . Omega-3 Fatty Acids (FISH OIL) 1000 MG CAPS Take 1 capsule by mouth daily.    . raloxifene (EVISTA) 60 MG tablet Take 1 tablet (60 mg total) by mouth daily. [place]  tablet 12  . vitamin E 100 UNIT capsule Take 100 Units by mouth daily.     No current facility-administered medications for this visit.    Allergies as of 09/11/2019 - Review Complete 09/11/2019  Allergen Reaction Noted  . Penicillins  05/01/2011    Review of Systems:    All systems  reviewed and negative except where noted in HPI.   Observations/Objective:  Labs: CMP     Component Value Date/Time   NA 141 03/07/2019 1508   K 4.5 03/07/2019 1508   CL 100 03/07/2019 1508   CO2 23 03/07/2019 1508   GLUCOSE 84 03/07/2019 1508   BUN 19 03/07/2019 1508   CREATININE 0.70 03/07/2019 1508   CALCIUM 9.8 03/07/2019 1508   PROT 7.4 03/07/2019 1508   ALBUMIN 4.4 03/07/2019 1508   AST 21 03/07/2019 1508   ALT 29 03/07/2019 1508   ALKPHOS 71 03/07/2019 1508   BILITOT 0.5 03/07/2019 1508   GFRNONAA 89 03/07/2019 1508   GFRAA 102 03/07/2019 1508   Lab Results  Component Value Date   WBC 6.0 04/16/2019   HGB 14.3 04/16/2019   HCT 43.9 04/16/2019   MCV 87 04/16/2019   PLT 318 04/16/2019    Imaging Studies: No results found.  Assessment and Plan:   Shirley Lowe is a 69 y.o. y/o female year follow-up of ulcerative colitis in remission  Assessment and Plan: Colonoscopy for surveillance is due  I have discussed alternative options, risks & benefits,  which include, but are not limited to, bleeding, infection, perforation,respiratory complication & drug reaction.  The patient agrees with this plan & written consent will be obtained.    CBC obtained within the last 6 months did not show any anemia.  CRP normal as well.  Follow Up Instructions:    I discussed the assessment and treatment plan with the patient. The patient was provided an opportunity to ask questions and all were answered. The patient agreed with the plan and demonstrated an understanding of the instructions.   The patient was advised to call back or seek an in-person evaluation if the symptoms worsen or if the condition fails to improve as anticipated.  I provided 15 minutes of non-face-to-face time during this encounter. Additional time was spent in reviewing patient's chart, placing orders etc.   Virgel Manifold, MD  Speech recognition software was used to dictate this note.

## 2019-11-05 ENCOUNTER — Telehealth: Payer: Self-pay

## 2019-11-05 DIAGNOSIS — R1032 Left lower quadrant pain: Secondary | ICD-10-CM

## 2019-11-05 NOTE — Telephone Encounter (Signed)
Patient called stating that for the past month and a half she had been having on/off LLQ abdominal pain last all day at times with nausea and constipation. Patient stated that she has history of diverticulosis. Patient is scheduled to have a colonoscopy on 11/12/2019 but wanting the physician to know that she was having these episodes. Patient denied fever, vomiting or diarrhea. Please advise on what patient could do.

## 2019-11-06 NOTE — Telephone Encounter (Signed)
Virgel Manifold, MD  You 5 hours ago (12:20 PM)   Please order CT Abdomen/pelvis with IV contrast to rule out diverticulitis. This needs to be done before her scheduled colonoscopy    Called patient to let her know that Dr. Bonna Gains would like to order a CT Scan of the Abdomen and Pelvis. However, since it's after 5: 30 PM, I told patient that I would have to ask Ginger to get it authorized tomorrow morning so I could call and schedule it for her. I also told patient to not eat or drink after midnight today so she could be ready for the scan and not wait any longer. Patient agreed and understood.

## 2019-11-07 NOTE — Telephone Encounter (Signed)
Called patient and told her that we are still waiting for her insurance to get the CT Scan approved. Therefore, I told her to go ahead and eat since we do not know how much longer we have to wait for an authorization from her insurance.

## 2019-11-10 ENCOUNTER — Other Ambulatory Visit: Admission: RE | Admit: 2019-11-10 | Payer: Medicare HMO | Source: Ambulatory Visit

## 2019-11-11 ENCOUNTER — Other Ambulatory Visit
Admission: RE | Admit: 2019-11-11 | Discharge: 2019-11-11 | Disposition: A | Discharge status: Home / Self Care | Payer: Medicare HMO | Source: Ambulatory Visit | Attending: Gastroenterology | Admitting: Gastroenterology

## 2019-11-11 ENCOUNTER — Other Ambulatory Visit: Payer: Self-pay

## 2019-11-11 DIAGNOSIS — Z20822 Contact with and (suspected) exposure to covid-19: Secondary | ICD-10-CM | POA: Diagnosis present

## 2019-11-11 DIAGNOSIS — Z8719 Personal history of other diseases of the digestive system: Secondary | ICD-10-CM

## 2019-11-11 MED ORDER — SUTAB 1479-225-188 MG PO TABS
ORAL_TABLET | ORAL | 0 refills | Status: DC
Start: 2019-11-11 — End: 2019-12-02

## 2019-11-11 NOTE — Telephone Encounter (Signed)
Patient's insurance denied her CT Scan. They are wanting for patient to do an U/S and labs prior. Therefore, Dr. Bonna Gains stated that the patient is to do the colonoscopy tomorrow. I then called patient to let her know what was going on and asked for her to go and get her COVID-19 test done right away and she stated that she would. I called the ENDO unit and spoke to [place] letting her know that the patient needed to be on for tomorrow. Wannetta Sender stated that she was not able to add her so for me to do so.

## 2019-11-12 ENCOUNTER — Encounter: Admission: RE | Payer: Self-pay | Source: Home / Self Care

## 2019-11-12 ENCOUNTER — Encounter
Admission: RE | Disposition: A | Discharge status: Home / Self Care | Payer: Self-pay | Source: Home / Self Care | Attending: Gastroenterology

## 2019-11-12 ENCOUNTER — Ambulatory Visit: Payer: Medicare HMO | Admitting: Certified Registered"

## 2019-11-12 ENCOUNTER — Ambulatory Visit
Admission: RE | Admit: 2019-11-12 | Discharge: 2019-11-12 | Disposition: A | Discharge status: Home / Self Care | Payer: Medicare HMO | Attending: Gastroenterology | Admitting: Gastroenterology

## 2019-11-12 ENCOUNTER — Other Ambulatory Visit: Payer: Self-pay

## 2019-11-12 ENCOUNTER — Ambulatory Visit: Admission: RE | Admit: 2019-11-12 | Payer: Medicare HMO | Source: Home / Self Care | Admitting: Gastroenterology

## 2019-11-12 ENCOUNTER — Encounter: Payer: Self-pay | Admitting: Gastroenterology

## 2019-11-12 DIAGNOSIS — Z09 Encounter for follow-up examination after completed treatment for conditions other than malignant neoplasm: Secondary | ICD-10-CM | POA: Diagnosis not present

## 2019-11-12 DIAGNOSIS — Z8719 Personal history of other diseases of the digestive system: Secondary | ICD-10-CM | POA: Diagnosis not present

## 2019-11-12 DIAGNOSIS — Z800 Family history of malignant neoplasm of digestive organs: Secondary | ICD-10-CM | POA: Insufficient documentation

## 2019-11-12 DIAGNOSIS — K5730 Diverticulosis of large intestine without perforation or abscess without bleeding: Secondary | ICD-10-CM | POA: Diagnosis not present

## 2019-11-12 DIAGNOSIS — Q438 Other specified congenital malformations of intestine: Secondary | ICD-10-CM | POA: Diagnosis not present

## 2019-11-12 DIAGNOSIS — Z5309 Procedure and treatment not carried out because of other contraindication: Secondary | ICD-10-CM | POA: Diagnosis not present

## 2019-11-12 DIAGNOSIS — M810 Age-related osteoporosis without current pathological fracture: Secondary | ICD-10-CM | POA: Diagnosis not present

## 2019-11-12 DIAGNOSIS — Z803 Family history of malignant neoplasm of breast: Secondary | ICD-10-CM | POA: Insufficient documentation

## 2019-11-12 DIAGNOSIS — Z79899 Other long term (current) drug therapy: Secondary | ICD-10-CM | POA: Diagnosis not present

## 2019-11-12 DIAGNOSIS — K5100 Ulcerative (chronic) pancolitis without complications: Secondary | ICD-10-CM | POA: Diagnosis not present

## 2019-11-12 HISTORY — PX: COLONOSCOPY WITH PROPOFOL: SHX5780

## 2019-11-12 LAB — SARS CORONAVIRUS 2 (TAT 6-24 HRS): SARS Coronavirus 2: NEGATIVE

## 2019-11-12 SURGERY — COLONOSCOPY WITH PROPOFOL
Anesthesia: General

## 2019-11-12 MED ORDER — PROPOFOL 500 MG/50ML IV EMUL
INTRAVENOUS | Status: DC | PRN
  Administered 2019-11-12: 165 ug/kg/min via INTRAVENOUS

## 2019-11-12 MED ORDER — PROPOFOL 10 MG/ML IV BOLUS
INTRAVENOUS | Status: DC | PRN
  Administered 2019-11-12: 10 mg via INTRAVENOUS
  Administered 2019-11-12: 50 mg via INTRAVENOUS

## 2019-11-12 MED ORDER — SODIUM CHLORIDE 0.9 % IV SOLN: INTRAVENOUS | Status: DC

## 2019-11-12 MED ORDER — LIDOCAINE HCL (CARDIAC) PF 100 MG/5ML IV SOSY
PREFILLED_SYRINGE | INTRAVENOUS | Status: DC | PRN
  Administered 2019-11-12: 100 mg via INTRAVENOUS

## 2019-11-12 NOTE — Anesthesia Preprocedure Evaluation (Signed)
Anesthesia Evaluation  Patient identified by MRN, date of birth, ID band Patient awake    Reviewed: Allergy & Precautions, NPO status , Patient's Chart, lab work & pertinent test results  History of Anesthesia Complications Negative for: history of anesthetic complications  Airway Mallampati: II       Dental   Pulmonary neg sleep apnea, neg COPD, Not current smoker,           Cardiovascular (-) hypertension(-) Past MI and (-) CHF + dysrhythmias (occassional rapid heart beat, not worked up) (-) Valvular Problems/Murmurs     Neuro/Psych neg Seizures    GI/Hepatic Neg liver ROS, neg GERD  ,  Endo/Other  neg diabetes  Renal/GU negative Renal ROS     Musculoskeletal   Abdominal   Peds  Hematology   Anesthesia Other Findings   Reproductive/Obstetrics                             Anesthesia Physical Anesthesia Plan  ASA: II  Anesthesia Plan: General   Post-op Pain Management:    Induction: Intravenous  PONV Risk Score and Plan: 3 and Propofol infusion, TIVA and Treatment may vary due to age or medical condition  Airway Management Planned: Nasal Cannula  Additional Equipment:   Intra-op Plan:   Post-operative Plan:   Informed Consent: I have reviewed the patients History and Physical, chart, labs and discussed the procedure including the risks, benefits and alternatives for the proposed anesthesia with the patient or authorized representative who has indicated his/her understanding and acceptance.       Plan Discussed with:   Anesthesia Plan Comments:         Anesthesia Quick Evaluation

## 2019-11-12 NOTE — Anesthesia Procedure Notes (Signed)
Procedure Name: General with mask airway Performed by: Fletcher-Harrison, Shantel Wesely, CRNA Pre-anesthesia Checklist: Patient identified, Emergency Drugs available, Suction available and Patient being monitored Patient Re-evaluated:Patient Re-evaluated prior to induction Oxygen Delivery Method: Simple face mask Induction Type: IV induction Placement Confirmation: positive ETCO2 and CO2 detector Dental Injury: Teeth and Oropharynx as per pre-operative assessment        

## 2019-11-12 NOTE — Op Note (Signed)
[facility] Patient Name: Shirley Lowe Procedure Date: 11/12/2019 11:47 AM MRN: [id number] Account #: [id number] Date of Birth: 04-28-1951 Admit Type: Outpatient Age: 69 Room: [facility] ENDO ROOM 2 Gender: Female Note Status: Finalized Procedure:             Colonoscopy Indications:           Follow-up of chronic ulcerative pancolitis Providers:             Tianne Plott B. Bonna Gains MD, MD Referring MD:          Cassie Freer, MD (Referring MD) Medicines:             Monitored Anesthesia Care Complications:         No immediate complications. Procedure:             Pre-Anesthesia Assessment:                        - Prior to the procedure, a History and Physical was                         performed, and patient medications, allergies and                         sensitivities were reviewed. The patient's tolerance                         of previous anesthesia was reviewed.                        - The risks and benefits of the procedure and the                         sedation options and risks were discussed with the                         patient. All questions were answered and informed                         consent was obtained.                        - Patient identification and proposed procedure were                         verified prior to the procedure by the physician, the                         nurse, the anesthesiologist, the anesthetist and the                         technician. The procedure was verified in the                         pre-procedure area in the procedure room in the                         endoscopy suite.                        - Prophylactic Antibiotics: The  patient does not                         require prophylactic antibiotics.                        - ASA Grade Assessment: II - A patient with mild                         systemic disease.                        - After reviewing the risks and benefits, the  patient                         was deemed in satisfactory condition to undergo the                         procedure.                        - Monitored anesthesia care was determined to be                         medically necessary for this procedure based on review                         of the patient's medical history, medications, and                         prior anesthesia history.                        - The anesthesia plan was to use monitored anesthesia                         care (MAC).                        After obtaining informed consent, the colonoscope was                         passed under direct vision. Throughout the procedure,                         the patient's blood pressure, pulse, and oxygen                         saturations were monitored continuously. The                         Colonoscope was introduced through the anus with the                         intention of advancing to the cecum. The scope was                         advanced to the sigmoid colon before the procedure was  aborted. Medications were given. The colonoscopy was                         performed with ease. The patient tolerated the                         procedure well. The quality of the bowel preparation                         was good. Findings:      The perianal and digital rectal examinations were normal.      The sigmoid colon was significantly tortuous. Advancing the scope       required using manual pressure. Advancing the scope required changing       position to supine and right lateral position. The scope could not be       advanced past an area in the sigmoid colon despite position changes and       abdominal pressure.      Normal mucosa was found in the rectum and in the sigmoid colon. Biopsies       were taken with a cold forceps for histology.      A few diverticula were found in the sigmoid colon. There was narrowing       of the colon  in association with the diverticular opening.      The retroflexed view of the distal rectum and anal verge was normal and       showed no anal or rectal abnormalities. Impression:            - Tortuous colon.                        - Normal mucosa in the rectum and in the sigmoid                         colon. Biopsied.                        - Diverticulosis in the sigmoid colon. There was                         narrowing of the colon in association with the                         diverticular opening. Recommendation:        - Await pathology results.                        - CT Abdomen/Pelvis with IV and rectal contrast (if                         normal renal function) to rule out stricture in                         sigmoid colon. If no stricture present, consider                         referral to tertiary center to attempt repeat  colonoscopy for UC surveillance biopsies.                        - Continue present medications.                        - The findings and recommendations were discussed with                         the patient.                        - The findings and recommendations were discussed with                         the patient's family.                        - Return to my office in 2 weeks. Procedure Code(s):     --- Professional ---                        [id number], 63, Colonoscopy, flexible; with biopsy, single                         or multiple Diagnosis Code(s):     --- Professional ---                        K51.00, Ulcerative (chronic) pancolitis without                         complications CPT copyright 2019 American Medical Association. All rights reserved. The codes documented in this report are preliminary and upon coder review may  be revised to meet current compliance requirements.  Vonda Antigua, MD Margretta Sidle B. Bonna Gains MD, MD 11/12/2019 12:29:50 PM This report has been signed electronically. Number of Addenda:  0 Note Initiated On: 11/12/2019 11:47 AM Total Procedure Duration: 0 hours 18 minutes 52 seconds  Estimated Blood Loss:  Estimated blood loss: none.      [facility]

## 2019-11-12 NOTE — Transfer of Care (Signed)
Immediate Anesthesia Transfer of Care Note  Patient: Shirley Lowe  Procedure(s) Performed: COLONOSCOPY WITH PROPOFOL (N/A )  Patient Location: Endoscopy Unit  Anesthesia Type:General  Level of Consciousness: drowsy and responds to stimulation  Airway & Oxygen Therapy: Patient Spontanous Breathing and Patient connected to face mask oxygen  Post-op Assessment: Report given to RN and Post -op Vital signs reviewed and stable  Post vital signs: Reviewed and stable  Last Vitals:  Vitals Value Taken Time  BP 108/57 11/12/19 1225  Temp    Pulse 65 11/12/19 1226  Resp 16 11/12/19 1226  SpO2 100 % 11/12/19 1226  Vitals shown include unvalidated device data.  Last Pain:  Vitals:   11/12/19 1104  TempSrc: Temporal  PainSc: 0-No pain         Complications: No complications documented.

## 2019-11-12 NOTE — Anesthesia Postprocedure Evaluation (Signed)
Anesthesia Post Note  Patient: Shirley Lowe  Procedure(s) Performed: COLONOSCOPY WITH PROPOFOL (N/A )  Patient location during evaluation: Endoscopy Anesthesia Type: General Level of consciousness: awake and alert Pain management: pain level controlled Vital Signs Assessment: post-procedure vital signs reviewed and stable Respiratory status: spontaneous breathing and respiratory function stable Cardiovascular status: stable Anesthetic complications: no   No complications documented.   Last Vitals:  Vitals:   11/12/19 1104 11/12/19 1226  BP: 137/73 (!) 108/57  Pulse: 82 66  Resp: 16 16  Temp: (!) 36.2 C (!) 36.2 C  SpO2: 97% 100%    Last Pain:  Vitals:   11/12/19 1226  TempSrc: Temporal  PainSc: Asleep                 Josiah Wojtaszek K

## 2019-11-12 NOTE — H&P (Addendum)
Vonda Antigua, MD [address] [place], [place], [place], [address] [address], [place], [place], [place], [address] Phone: [phone]  Fax: [phone]  Primary Care Physician:  Hubbard Hartshorn, FNP   Pre-Procedure History & Physical: HPI:  Shirley Lowe is a 69 y.o. female is here for a colonoscopy.   Past Medical History:  Diagnosis Date  . Osteoporosis   . Ulcerative colitis     Past Surgical History:  Procedure Laterality Date  . ABDOMINAL HYSTERECTOMY    . INCONTINENCE SURGERY    . TONSILLECTOMY      Prior to Admission medications   Medication Sig Start Date End Date Taking? Authorizing Provider  Ascorbic Acid (VITAMIN C) 1000 MG tablet Take 1,000 mg by mouth daily.   Yes [provider]  b complex vitamins tablet Take 1 tablet by mouth daily.   Yes [provider]  Cholecalciferol (VITAMIN D) 50 MCG (2000 UT) CAPS Take 1 capsule (2,000 Units total) by mouth daily. 08/28/19  Yes Sowles, Drue Stager, MD  Omega-3 Fatty Acids (FISH OIL) 1000 MG CAPS Take 1 capsule by mouth daily.   Yes [provider]  raloxifene (EVISTA) 60 MG tablet Take 1 tablet (60 mg total) by mouth daily. 08/28/19  Yes Sowles, Drue Stager, MD  vitamin E 100 UNIT capsule Take 100 Units by mouth daily.   Yes [provider]  clobetasol cream (TEMOVATE) 4.62 % Apply 1 application topically at bedtime. 07/17/17   Arnetha Courser, MD  mesalamine (LIALDA) 1.2 g EC tablet Take 2 tablets (2.4 g total) by mouth daily. 03/07/19   Hubbard Hartshorn, FNP  Sodium Sulfate-Mag Sulfate-KCl (SUTAB) [phone] MG TABS At 5 PM take 12 tablets using the 8 oz cup provided in the kit drinking 5 cups of water and 5 hours before your procedure repeat the same process. 11/11/19   Virgel Manifold, MD    Allergies as of 11/11/2019 - Review Complete 09/11/2019  Allergen Reaction Noted  . Penicillins  05/01/2011    Family History  Problem Relation Age of Onset  . Cancer Mother          breast cancer  . Breast cancer Mother 65       metastic  . Cancer Father        pancreatic  . Hepatitis C Sister   . Thyroid disease Sister   . Irritable bowel syndrome Sister   . Allergies Sister     Social History   Socioeconomic History  . Marital status: Married    Spouse name: [name]  . Number of children: 3  . Years of education: Not on file  . Highest education level: Bachelor's degree (e.g., BA, AB, BS)  Occupational History  . Occupation: Retired  Tobacco Use  . Smoking status: Never Smoker  . Smokeless tobacco: Never Used  . Tobacco comment: smoking cessation materials not required  Vaping Use  . Vaping Use: Never used  Substance and Sexual Activity  . Alcohol use: No  . Drug use: No  . Sexual activity: Not Currently  Other Topics Concern  . Not on file  Social History Narrative  . Not on file   Social Determinants of Health   Financial Resource Strain:   . Difficulty of Paying Living Expenses:   Food Insecurity:   . Worried About Charity fundraiser in the Last Year:   . [place] in the Last Year:   Transportation Needs:   . Lack of  Transportation (Medical):   Marland Kitchen Lack of Transportation (Non-Medical):   Physical Activity:   . Days of Exercise per Week:   . Minutes of Exercise per Session:   Stress:   . Feeling of Stress :   Social Connections:   . Frequency of Communication with Friends and Family:   . Frequency of Social Gatherings with Friends and Family:   . Attends Religious Services:   . Active Member of Clubs or Organizations:   . Attends Archivist Meetings:   Marland Kitchen Marital Status:   Intimate Partner Violence:   . Fear of Current or Ex-Partner:   . Emotionally Abused:   Marland Kitchen Physically Abused:   . Sexually Abused:     Review of Systems: See HPI, otherwise negative ROS  Physical Exam: BP 137/73   Pulse 82   Temp (!) 97.2 F (36.2 C) (Temporal)   Resp 16   Ht _0  (1.626 m)   Wt 80.7 kg   SpO2 97%   BMI 30.55  kg/m  General:   Alert,  pleasant and cooperative in NAD Head:  Normocephalic and atraumatic. Neck:  Supple; no masses or thyromegaly. Lungs:  Clear throughout to auscultation, normal respiratory effort.    Heart:  +S1, +S2, Regular rate and rhythm, No edema. Abdomen:  Soft, nontender and nondistended. Normal bowel sounds, without guarding, and without rebound.   Neurologic:  Alert and  oriented x4;  grossly normal neurologically.  Impression/Plan: Shirley Lowe is here for a colonoscopy to be performed for UC surveillance. Pt reports complete resolution of abdominal pain she had previously called [place] about  Risks, benefits, limitations, and alternatives regarding  colonoscopy have been reviewed with the patient.  Questions have been answered.  All parties agreeable.   Virgel Manifold, MD  11/12/2019, 11:10 AM

## 2019-11-13 ENCOUNTER — Encounter: Payer: Self-pay | Admitting: Gastroenterology

## 2019-11-13 ENCOUNTER — Telehealth: Payer: Self-pay

## 2019-11-13 DIAGNOSIS — K56699 Other intestinal obstruction unspecified as to partial versus complete obstruction: Secondary | ICD-10-CM

## 2019-11-13 LAB — SURGICAL PATHOLOGY

## 2019-11-13 NOTE — Telephone Encounter (Signed)
Secure chat from Dr. Bonna Gains: doesnt need to be stat. ct abdomen pelvis with IV contrast and rectal contrast for sigmoid stricture   Called patient to let her know that Dr. Bonna Gains wants her to have the CT Scan done, therefore, I went ahead and ordered it. Patient's CT Scan will be on November 20, 2019 at the [facility] location. Patient was told that she needed to pick up her contrast and to be fasting 4 hours prior to her CT Scan. Patient agreed and had no further questions. Patient is also scheduled to have a follow up telephone appointment on 12/02/2019 at 2:45 PM with Dr. Bonna Gains to discuss CT Scan results.

## 2019-11-13 NOTE — Telephone Encounter (Signed)
Patient called stating that Dr. Bonna Gains was not able to do her colonoscopy because she had a stricture in her colon. Patient stated that Dr. Bonna Gains had recommended for her to have a CT Scan with contrast soon. I told her that I would have to ask Dr. Bonna Gains first and then I would call her with her response. Patient agreed. Patient stated that she could have the CT done on July 19, 22, 23, 28 and 29.

## 2019-11-20 ENCOUNTER — Ambulatory Visit: Payer: Medicare HMO

## 2019-11-20 ENCOUNTER — Ambulatory Visit: Admission: RE | Admit: 2019-11-20 | Payer: Medicare HMO | Source: Ambulatory Visit

## 2019-11-20 ENCOUNTER — Telehealth: Payer: Self-pay

## 2019-11-20 NOTE — Telephone Encounter (Signed)
Patient called and left a voicemail letting me know that her insurance approved her CT Scan. Therefore, it is ready to be scheduled. However, it was too late for me to do so. Ginger, can you please do me the favor of scheduling and then call patient to give her the information. Thank you so much!

## 2019-11-21 ENCOUNTER — Other Ambulatory Visit: Payer: Self-pay | Admitting: Gastroenterology

## 2019-11-21 DIAGNOSIS — K51311 Ulcerative (chronic) rectosigmoiditis with rectal bleeding: Secondary | ICD-10-CM

## 2019-11-21 NOTE — Telephone Encounter (Signed)
Returned pt's call and she stated she had made a mistake. The approval was for her husband. Advised pt we have not received an answer from the insurance yet but will get her scheduled as soon as we do.

## 2019-11-26 ENCOUNTER — Emergency Department: Payer: Medicare HMO

## 2019-11-26 ENCOUNTER — Encounter: Payer: Self-pay | Admitting: Emergency Medicine

## 2019-11-26 ENCOUNTER — Other Ambulatory Visit: Payer: Self-pay

## 2019-11-26 DIAGNOSIS — M549 Dorsalgia, unspecified: Secondary | ICD-10-CM | POA: Diagnosis not present

## 2019-11-26 DIAGNOSIS — R110 Nausea: Secondary | ICD-10-CM | POA: Diagnosis not present

## 2019-11-26 DIAGNOSIS — Z5321 Procedure and treatment not carried out due to patient leaving prior to being seen by health care provider: Secondary | ICD-10-CM | POA: Diagnosis not present

## 2019-11-26 DIAGNOSIS — R079 Chest pain, unspecified: Secondary | ICD-10-CM | POA: Insufficient documentation

## 2019-11-26 LAB — CBC WITH DIFFERENTIAL/PLATELET
Abs Immature Granulocytes: 0.02 10*3/uL (ref 0.00–0.07)
Basophils Absolute: 0.1 10*3/uL (ref 0.0–0.1)
Basophils Relative: 1 %
Eosinophils Absolute: 0.2 10*3/uL (ref 0.0–0.5)
Eosinophils Relative: 2 %
HCT: 43.3 % (ref 36.0–46.0)
Hemoglobin: 14 g/dL (ref 12.0–15.0)
Immature Granulocytes: 0 %
Lymphocytes Relative: 32 %
Lymphs Abs: 2.8 10*3/uL (ref 0.7–4.0)
MCH: 28.6 pg (ref 26.0–34.0)
MCHC: 32.3 g/dL (ref 30.0–36.0)
MCV: 88.5 fL (ref 80.0–100.0)
Monocytes Absolute: 0.7 10*3/uL (ref 0.1–1.0)
Monocytes Relative: 8 %
Neutro Abs: 5 10*3/uL (ref 1.7–7.7)
Neutrophils Relative %: 57 %
Platelets: 307 10*3/uL (ref 150–400)
RBC: 4.89 MIL/uL (ref 3.87–5.11)
RDW: 13.1 % (ref 11.5–15.5)
WBC: 8.8 10*3/uL (ref 4.0–10.5)
nRBC: 0 % (ref 0.0–0.2)

## 2019-11-26 LAB — COMPREHENSIVE METABOLIC PANEL
ALT: 24 U/L (ref 0–44)
AST: 23 U/L (ref 15–41)
Albumin: 4.4 g/dL (ref 3.5–5.0)
Alkaline Phosphatase: 63 U/L (ref 38–126)
Anion gap: 11 (ref 5–15)
BUN: 25 mg/dL — ABNORMAL HIGH (ref 8–23)
CO2: 28 mmol/L (ref 22–32)
Calcium: 9.6 mg/dL (ref 8.9–10.3)
Chloride: 103 mmol/L (ref 98–111)
Creatinine, Ser: 0.69 mg/dL (ref 0.44–1.00)
GFR calc Af Amer: >60 mL/min (ref >60)
GFR calc non Af Amer: >60 mL/min (ref >60)
Glucose, Bld: 121 mg/dL — ABNORMAL HIGH (ref 70–99)
Potassium: 3.6 mmol/L (ref 3.5–5.1)
Sodium: 142 mmol/L (ref 135–145)
Total Bilirubin: 0.6 mg/dL (ref 0.3–1.2)
Total Protein: 8.2 g/dL — ABNORMAL HIGH (ref 6.5–8.1)

## 2019-11-26 LAB — TROPONIN I (HIGH SENSITIVITY): Troponin I (High Sensitivity): 4 ng/L (ref <18)

## 2019-11-26 IMAGING — CR DG CHEST 2V
2 series · 2 of 2 positions shown · non-contrast
Comparison: None.

CLINICAL DATA: Pain beneath the left breast

EXAM:
CHEST - 2 VIEW

[chest pa]
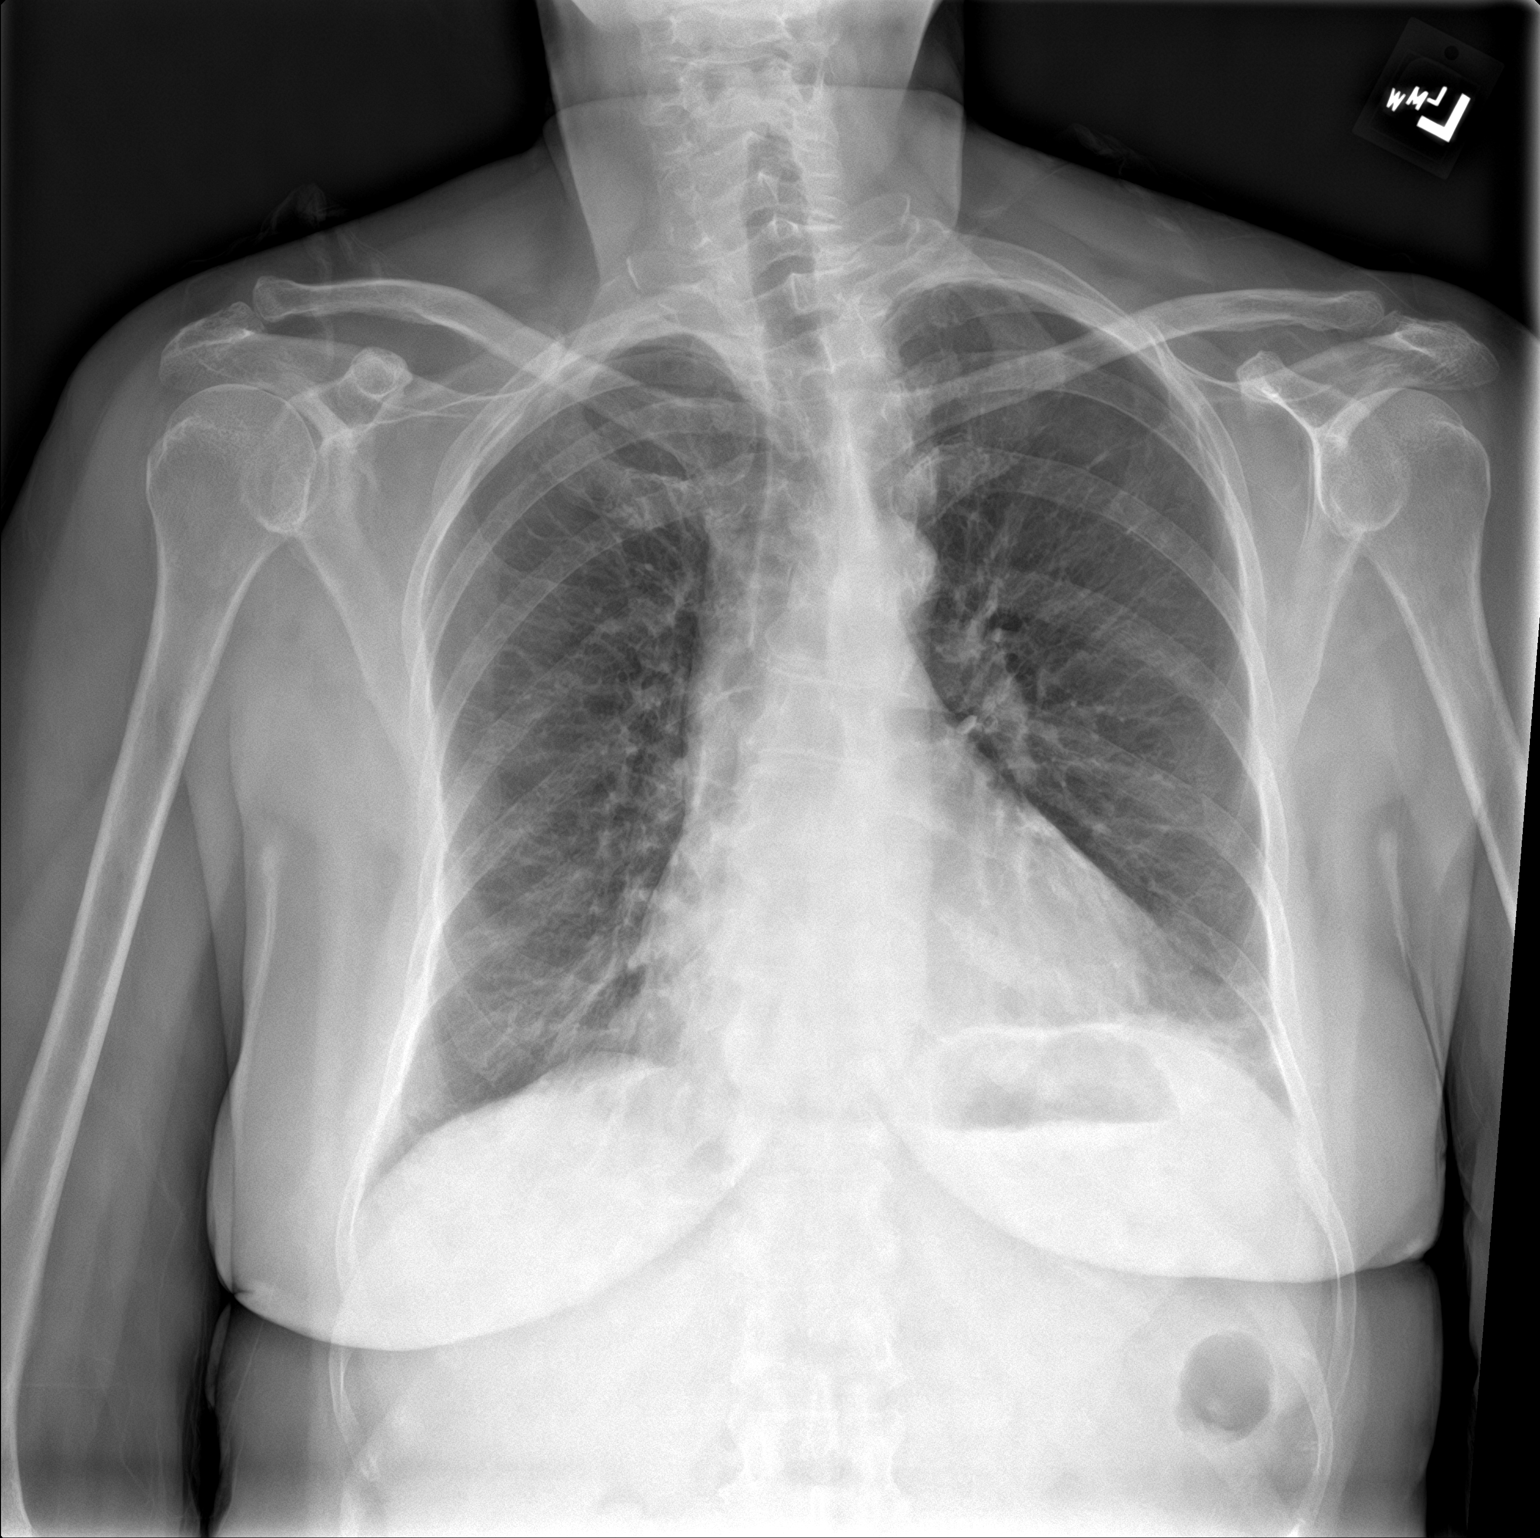

[chest lat]
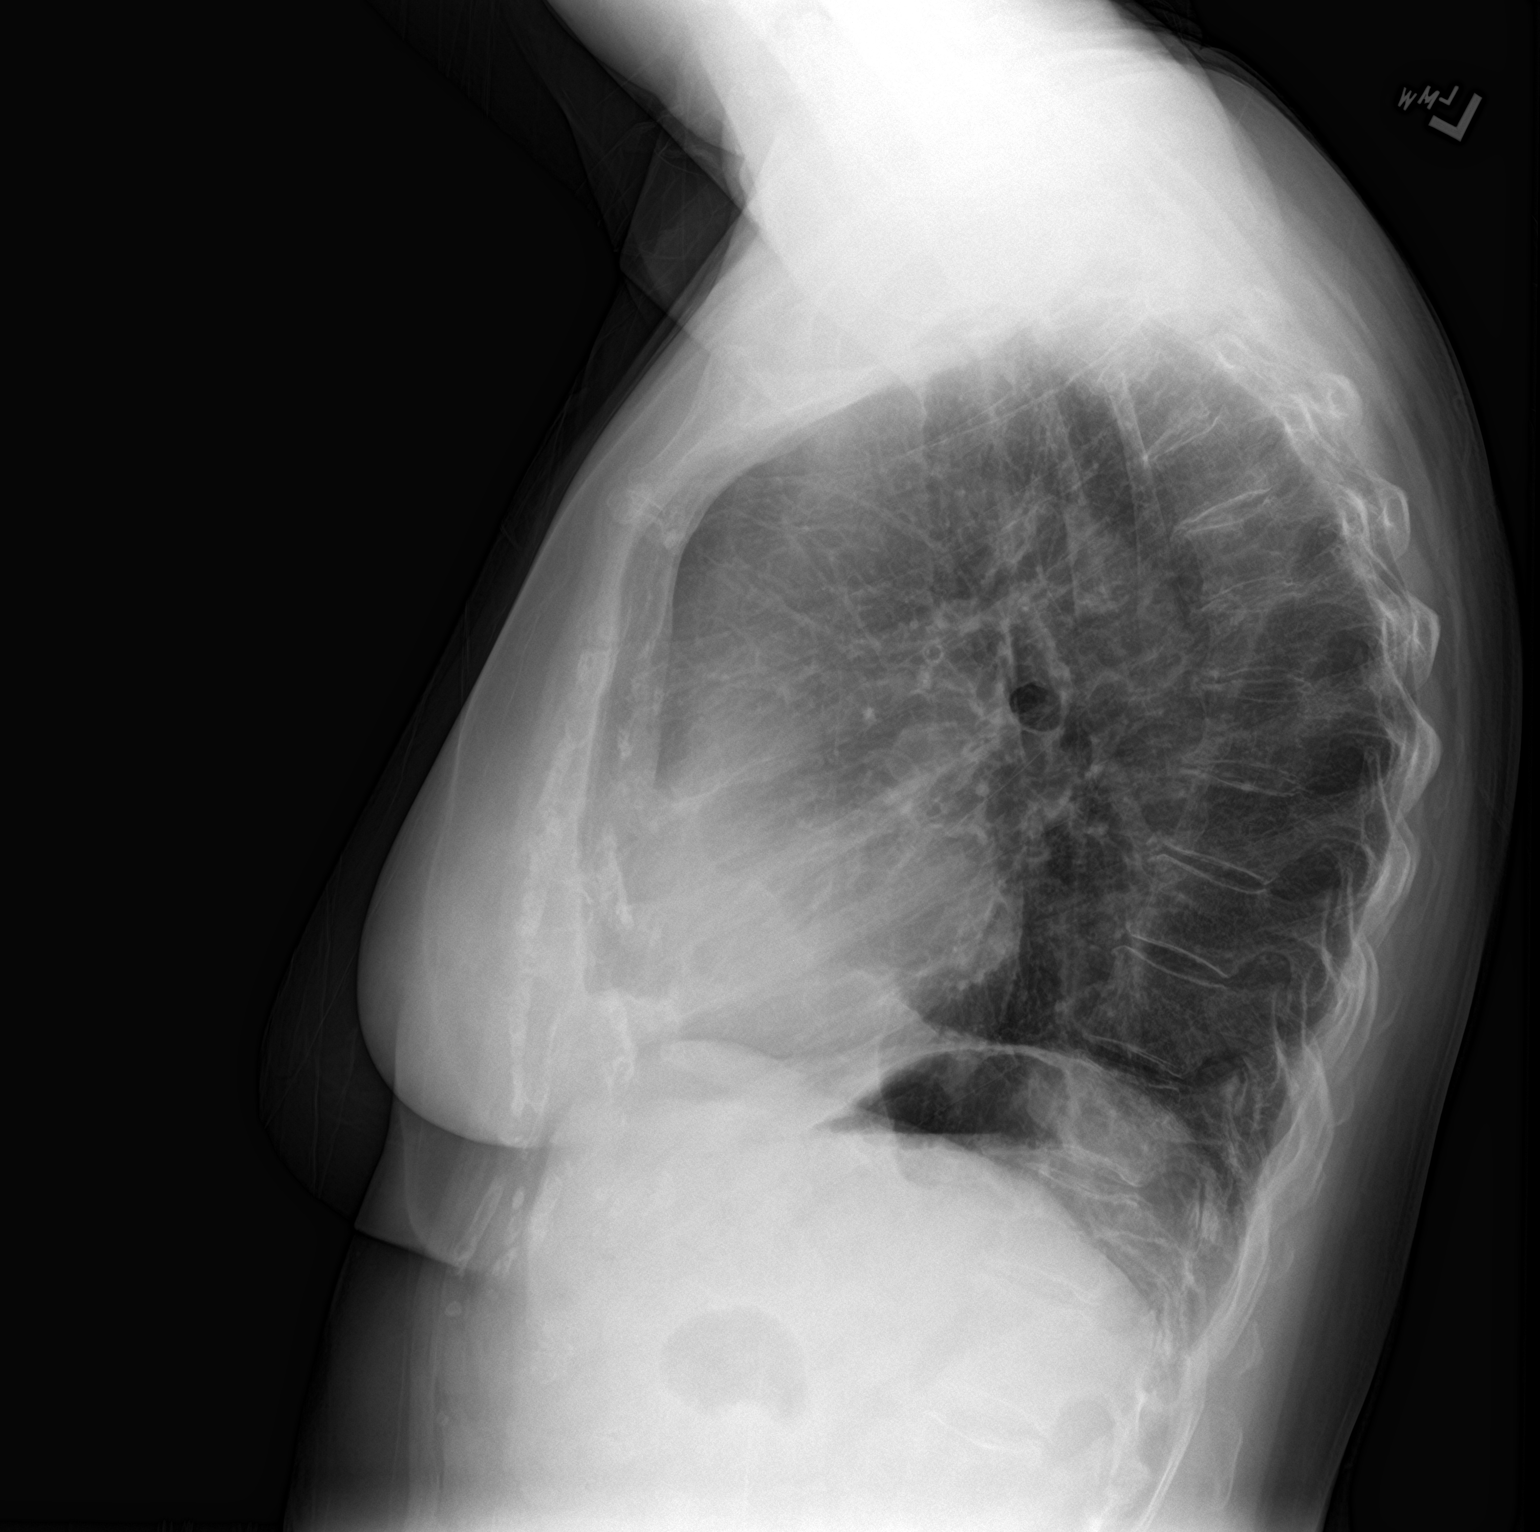

[2 of 2 positions shown; findings below may reference images not displayed]

FINDINGS: Subsegmental atelectasis or scar at the left base. No consolidation.
Borderline cardiomegaly. Aortic atherosclerosis. No pneumothorax.
IMPRESSION: Subsegmental atelectasis or scar at the left base.

## 2019-11-26 NOTE — ED Triage Notes (Signed)
Pt to triage via w/c; Pt st at 1015pm began having pain beneath left breast and radiating into back accomp by nausea; st hx of same but was never seen for such

## 2019-11-27 ENCOUNTER — Other Ambulatory Visit: Payer: Self-pay

## 2019-11-27 ENCOUNTER — Encounter: Payer: Self-pay | Admitting: Emergency Medicine

## 2019-11-27 ENCOUNTER — Emergency Department
Admission: EM | Admit: 2019-11-27 | Discharge: 2019-11-27 | Disposition: A | Discharge status: Home / Self Care | Payer: Medicare HMO | Attending: Emergency Medicine | Admitting: Emergency Medicine

## 2019-11-27 ENCOUNTER — Ambulatory Visit: Payer: Medicare HMO

## 2019-11-27 ENCOUNTER — Emergency Department: Payer: Medicare HMO

## 2019-11-27 ENCOUNTER — Emergency Department
Admission: EM | Admit: 2019-11-27 | Discharge: 2019-11-27 | Disposition: A | Discharge status: Home / Self Care | Payer: Medicare HMO | Source: Home / Self Care | Attending: Student in an Organized Health Care Education/Training Program | Admitting: Student in an Organized Health Care Education/Training Program

## 2019-11-27 DIAGNOSIS — K5732 Diverticulitis of large intestine without perforation or abscess without bleeding: Secondary | ICD-10-CM | POA: Insufficient documentation

## 2019-11-27 DIAGNOSIS — R1013 Epigastric pain: Secondary | ICD-10-CM | POA: Insufficient documentation

## 2019-11-27 DIAGNOSIS — R079 Chest pain, unspecified: Secondary | ICD-10-CM

## 2019-11-27 DIAGNOSIS — K5792 Diverticulitis of intestine, part unspecified, without perforation or abscess without bleeding: Secondary | ICD-10-CM

## 2019-11-27 LAB — TROPONIN I (HIGH SENSITIVITY): Troponin I (High Sensitivity): 13 ng/L (ref <18)

## 2019-11-27 IMAGING — CT CT ANGIO CHEST-ABD-PELV FOR DISSECTION W/ AND WO/W CM
2 of 7 series · 11 of 46 positions shown, 12 images · IV contrast (APPLIED)
Comparison: Chest radiograph November 26, 2019

CLINICAL DATA: Chest and abdominal pain

EXAM:
CT ANGIOGRAPHY CHEST, ABDOMEN AND PELVIS
TECHNIQUE: Non-contrast CT of the chest was initially obtained.

[Series 5: axial arterial · axial · arterial · 0.63mm/px · z∈[-729,-204]mm · 8 of 203 slices shown, 9 images]
[im 14/203  soft-tissue]
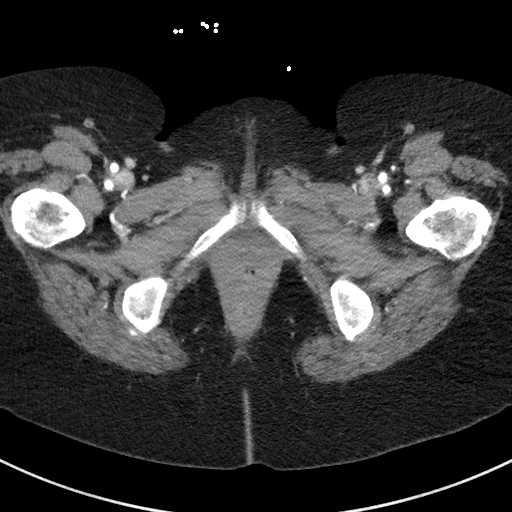
[im 14/203  bone]
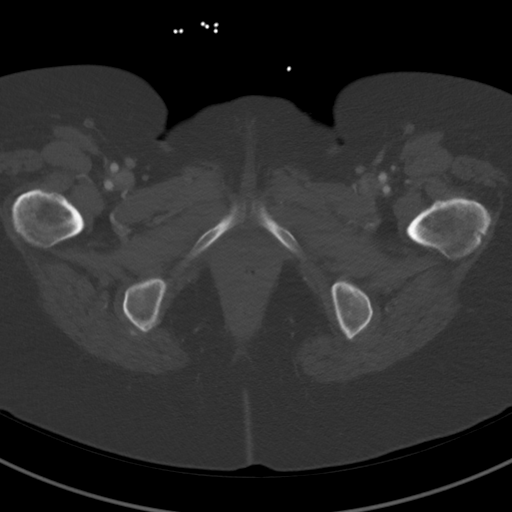
[im 41/203  soft-tissue]
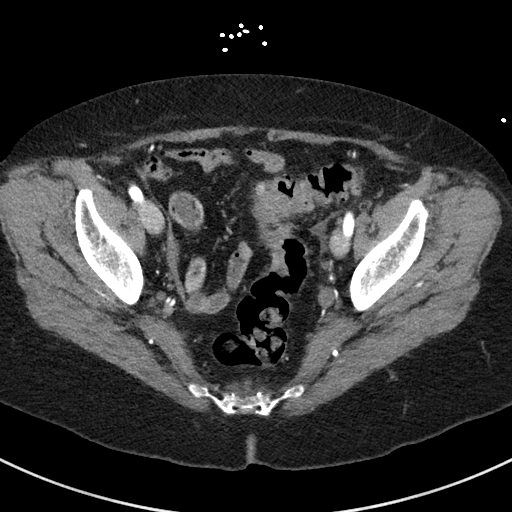
[im 68/203  soft-tissue]
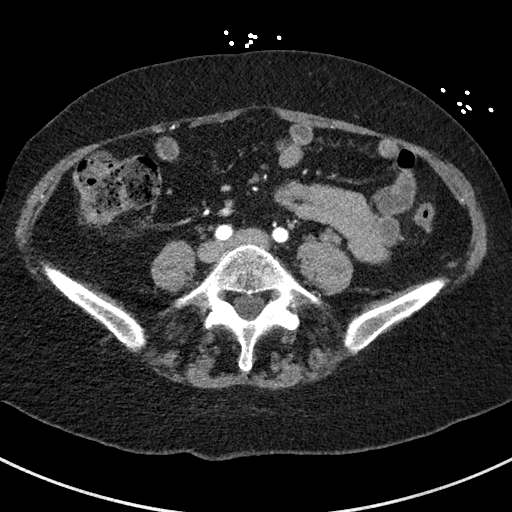
[im 95/203  soft-tissue]
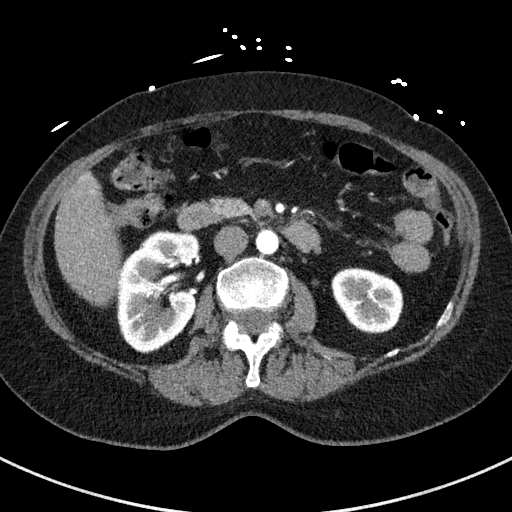
[im 108/203  soft-tissue]
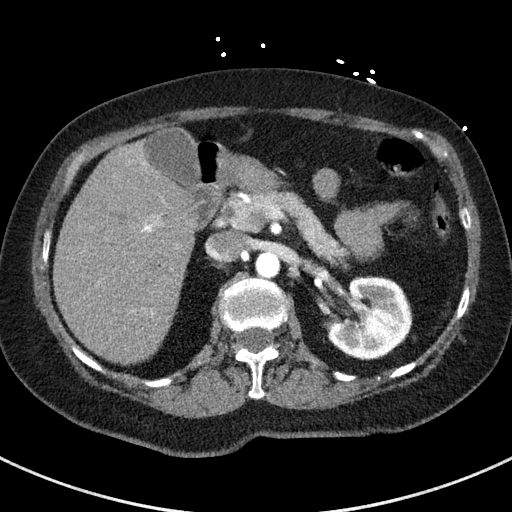
[im 135/203  soft-tissue]
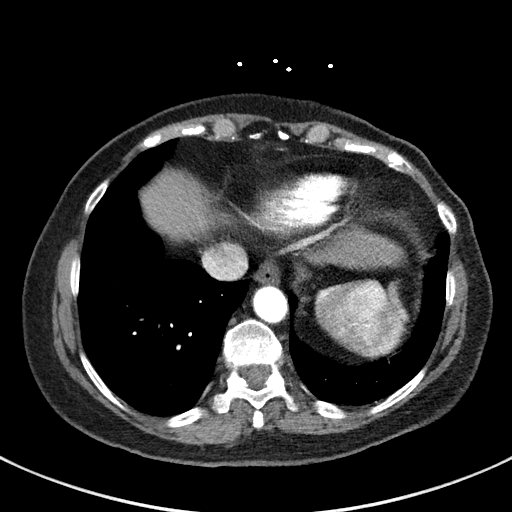
[im 162/203  soft-tissue]
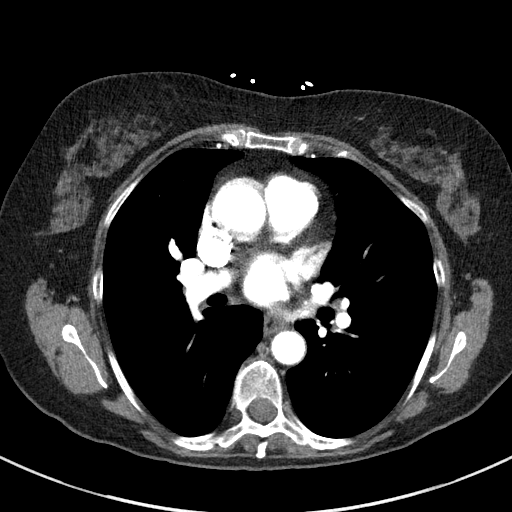
[im 189/203  soft-tissue]
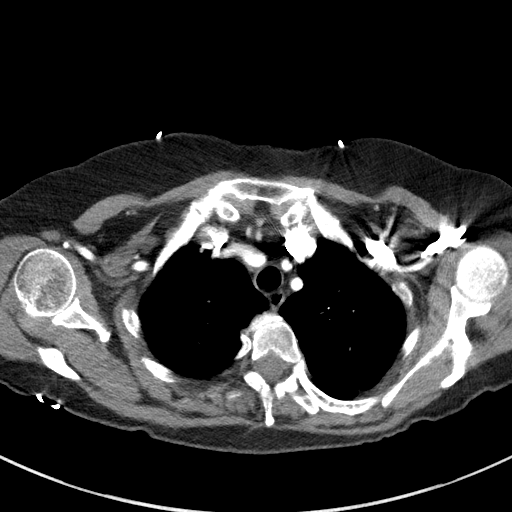

[Series 7: coronals · coronal · 0.75mm/px · 3 of 130 slices shown]
[im 33/130  soft-tissue]
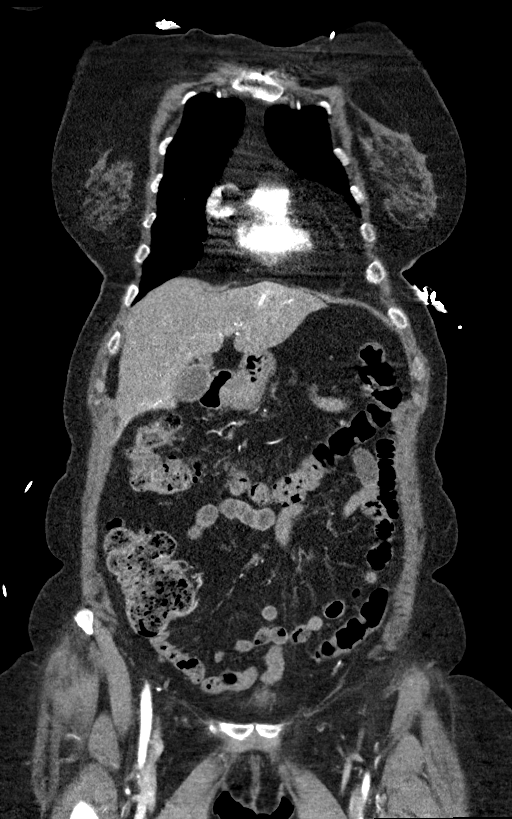
[im 65/130  soft-tissue]
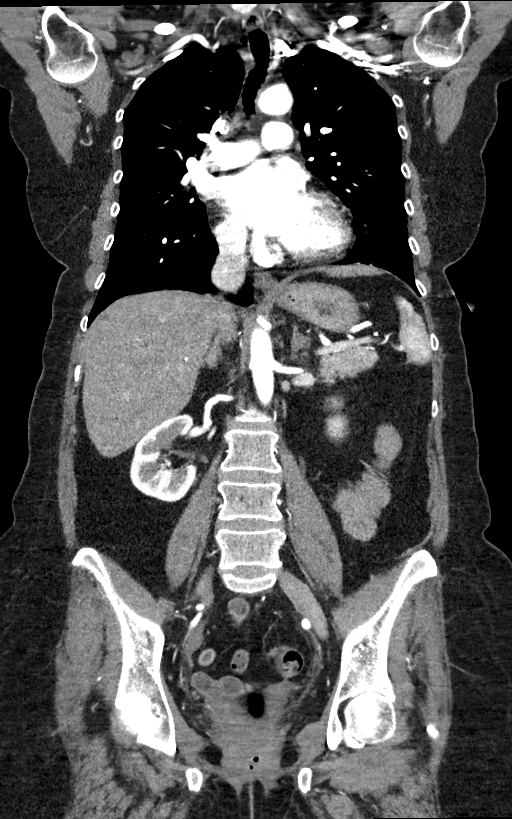
[im 97/130  soft-tissue]
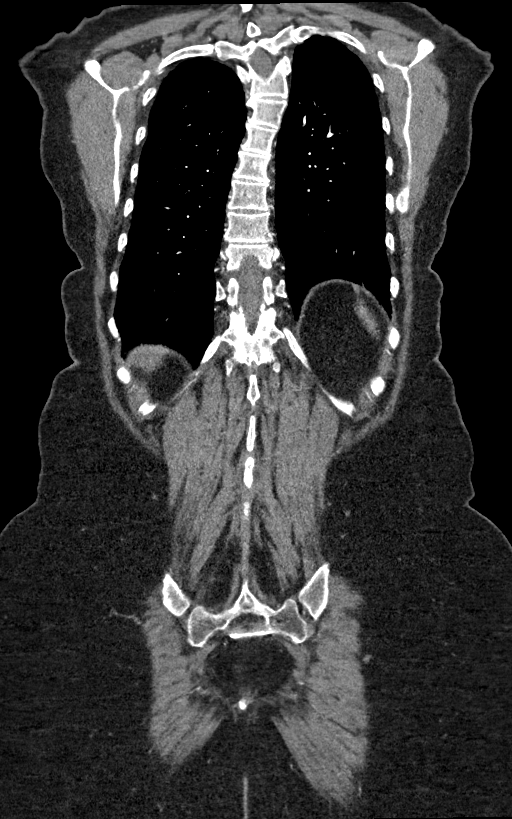

[11 of 46 positions shown; findings below may reference images not displayed]

Multidetector CT imaging through the chest, abdomen and pelvis was
performed using the standard protocol during bolus administration of
intravenous contrast. Multiplanar reconstructed images and MIPs were
obtained and reviewed to evaluate the vascular anatomy.

CONTRAST:  100mL OMNIPAQUE IOHEXOL 350 MG/ML SOLN
FINDINGS: CTA CHEST FINDINGS

Cardiovascular: There is no demonstrable intramural hematoma on
noncontrast enhanced study. There is no evident thoracic aortic
aneurysm or dissection. The visualized great vessels appear
unremarkable without appreciable atherosclerotic plaque, aneurysm or
dissection. There is slight aortic atherosclerosis. There is no
appreciable narrowing of the thoracic aorta. No mucosal irregularity
appreciable by CT. No pericardial effusion or pericardial thickening
evident. No evident pulmonary embolus.

Mediastinum/Nodes: Visualized thyroid appears normal. There is no
appreciable thoracic adenopathy. No esophageal lesions are
appreciable.

Lungs/Pleura: There is slight bibasilar atelectasis. There is no
appreciable edema or airspace opacity. No pleural effusions are
evident. On axial slice 62 series 6, there is a 4 x 2 mm nodular
opacity abutting the pleura in the posteromedial aspect of the
superior segment of the right lower lobe.

Musculoskeletal: There is thoracic levoscoliosis. There are foci of
degenerative change in the thoracic spine. No fracture or
dislocation evident. No blastic or lytic bone lesions. No evident
chest wall lesions.

Review of the MIP images confirms the above findings.

CTA ABDOMEN AND PELVIS FINDINGS

VASCULAR

Aorta: There is no demonstrable aortic aneurysm or dissection. No
appreciable atherosclerotic irregularity.

Celiac: Celiac artery and its branches show no evident aneurysm or
dissection. There is mild calcification at the origin of the celiac
artery with only minimal narrowing in this area.

SMA: Superior mesenteric artery and its branches are widely patent.
No aneurysm or dissection evident involving these vessels.

Renals: Single renal artery appreciable on each side. Renal arteries
and respective branches show no evident aneurysm or dissection. No
fibromuscular dysplasia. No appreciable vessel narrowing or
atherosclerotic irregularity.

IMA: Inferior mesenteric artery and its branches are patent. No
aneurysm or dissection involving these vessels.

Inflow: The pelvic arterial vessels are widely patent without
appreciable atherosclerotic irregularity on either side. No aneurysm
or dissection involving pelvic arterial vessels. Proximal
superficial femoral and profunda femoral arteries are widely patent
without aneurysm or dissection involving these vessels.

Veins: No obvious venous abnormality within the limitations of this
arterial phase study.

Review of the MIP images confirms the above findings.

NON-VASCULAR

Hepatobiliary: No focal liver lesions are appreciable on arterial
phase imaging. Gallbladder wall is not appreciably thickened. There
is no biliary duct dilatation.

Pancreas: No pancreatic mass or inflammatory focus.

Spleen: No splenic lesions evident.

Adrenals/Urinary Tract: Adrenals bilaterally appear unremarkable.
Kidneys bilaterally show no evident mass or hydronephrosis on either
side. There is no demonstrable renal or ureteral calculus on either
side. Urinary bladder is midline with wall thickness within normal
limits.

Stomach/Bowel: There are multiple sigmoid diverticula. There is wall
thickening in the mid to distal sigmoid colon with nearby soft
tissue stranding and fluid along the leftward aspect of the mid
sigmoid colon consistent with a degree of diverticulitis. No abscess
or extraluminal air to suggest perforation evident. Elsewhere, there
is no appreciable bowel wall or mesenteric thickening. There is no
evident bowel obstruction. The terminal ileum appears normal. There
is no appreciable free air or portal venous air. There appears to be
a degree of mild rectal prolapse.

Lymphatic: There is no evident adenopathy in the abdomen or pelvis.

Reproductive: The uterus is absent. No evident pelvic mass. Suspect
a degree of vaginal prolapse

Other: The appendix appears normal. There is no evident abscess or
ascites in the abdomen or pelvis. There is slight fat in the
umbilicus.

Musculoskeletal: No fracture or dislocation. No blastic or lytic
bone lesions are evident. No intramuscular lesions.

Review of the MIP images confirms the above findings.
IMPRESSION: Chest CT angiogram:

1. No aneurysm or dissection. Slight aortic atherosclerosis. No
appreciable mucosal irregularity in the aorta or visualized great
vessels.

2. Areas of slight atelectatic change. No edema or airspace opacity.
3 mm nodular opacity right lower lobe. No follow-up needed if
patient is low-risk. Non-contrast chest CT can be considered in 12
months if patient is high-risk. This recommendation follows the
consensus statement: Guidelines for Management of Incidental
Pulmonary Nodules Detected on CT Images: From the [HOSPITAL]

3.  No evident adenopathy.

CT angiogram abdomen; CT angiogram pelvis:

1. No aneurysm or dissection involving the aorta, major mesenteric,
and major pelvic arterial vessels. No fibromuscular dysplasia.
Minimal atherosclerotic plaque noted at the origin of the celiac
artery. Other vessels appear widely patent throughout the respective
courses.

2. There is evidence of a degree of diverticulitis in the mid to
lower sigmoid colon. No appreciable abscess or evidence of
perforation.

3. Suspect a degree of mild rectal and vaginal prolapse. Clinical
assessment in this regard advised.

4. No bowel obstruction. No abscess in the abdomen or pelvis.
Appendix appears normal.

5. No renal or ureteral calculus. No hydronephrosis. Urinary bladder
wall thickness normal.

6.  Uterus absent.

Aortic Atherosclerosis ([id number]-502.2).

## 2019-11-27 MED ORDER — METRONIDAZOLE 250 MG PO TABS: 250.0000 mg | ORAL_TABLET | Freq: Three times a day (TID) | ORAL | 0 refills | Status: AC

## 2019-11-27 MED ORDER — CIPROFLOXACIN HCL 500 MG PO TABS
500.0000 mg | ORAL_TABLET | Freq: Once | ORAL | Status: AC
  Administered 2019-11-27: 500 mg via ORAL
  Filled 2019-11-27: qty 1

## 2019-11-27 MED ORDER — SODIUM CHLORIDE 0.9 % IV BOLUS
500.0000 mL | Freq: Once | INTRAVENOUS | Status: AC
  Administered 2019-11-27: 500 mL via INTRAVENOUS

## 2019-11-27 MED ORDER — PROBIOTIC 250 MG PO CAPS
1.0000 | ORAL_CAPSULE | Freq: Two times a day (BID) | ORAL | 0 refills | Status: DC | PRN
Start: 2019-11-27 — End: 2020-05-12

## 2019-11-27 MED ORDER — CIPROFLOXACIN HCL 500 MG PO TABS
500.0000 mg | ORAL_TABLET | Freq: Two times a day (BID) | ORAL | 0 refills | Status: AC
Start: 2019-11-27 — End: 2019-12-04

## 2019-11-27 MED ORDER — IOHEXOL 350 MG/ML SOLN
100.0000 mL | Freq: Once | INTRAVENOUS | Status: AC | PRN
  Administered 2019-11-27: 100 mL via INTRAVENOUS

## 2019-11-27 MED ORDER — METRONIDAZOLE 500 MG PO TABS
500.0000 mg | ORAL_TABLET | Freq: Once | ORAL | Status: AC
  Administered 2019-11-27: 500 mg via ORAL
  Filled 2019-11-27: qty 1

## 2019-11-27 MED ORDER — ONDANSETRON HCL 4 MG PO TABS: 4.0000 mg | ORAL_TABLET | Freq: Every day | ORAL | 0 refills | Status: DC | PRN

## 2019-11-27 NOTE — ED Notes (Signed)
No answer when called several times from lobby

## 2019-11-27 NOTE — ED Notes (Signed)
Pt states she was here last night and seen for chest and back pain- pt states that she has had pain similar to this but not "this intense"- pt denies having pain now- pt concerned about CT scan that was scheduled for 10AM today

## 2019-11-27 NOTE — ED Provider Notes (Addendum)
[facility] Emergency Department Provider Note    First MD Initiated Contact with Patient 11/27/19 [phone]     (approximate)  I have reviewed the triage vital signs and the nursing notes.   HISTORY  Chief Complaint Chest Pain    HPI Shirley Lowe is a 69 y.o. female presents to the ER for evaluation of midepigastric as well as left-sided chest pain radiating through to her back that occurred last night.  Episode happened several times was severe and 10 out of 10 in nature.  Has never had pain like that before.  Denies any diaphoresis.  No shortness of breath.  No fevers.  No dysuria.   States she has been feeling some generalized "sickness "associated with some nausea abdominal discomfort.  Recently had colonoscopy which they were unable to complete.  Is still moving her bowels.   Past Medical History:  Diagnosis Date  . Osteoporosis   . Ulcerative colitis    Family History  Problem Relation Age of Onset  . Cancer Mother        breast cancer  . Breast cancer Mother 67       metastic  . Cancer Father        pancreatic  . Hepatitis C Sister   . Thyroid disease Sister   . Irritable bowel syndrome Sister   . Allergies Sister    Past Surgical History:  Procedure Laterality Date  . ABDOMINAL HYSTERECTOMY    . COLONOSCOPY WITH PROPOFOL N/A 11/12/2019   Procedure: COLONOSCOPY WITH PROPOFOL;  Surgeon: Virgel Manifold, MD;  Location: [facility] ENDOSCOPY;  Service: Endoscopy;  Laterality: N/A;  . INCONTINENCE SURGERY    . TONSILLECTOMY     Patient Active Problem List   Diagnosis Date Noted  . History of ulcerative colitis   . Low vitamin D level 03/10/2019  . Osteoporosis 07/17/2017  . Lipodermatosclerosis 07/17/2017  . Urinary incontinence in female 07/17/2017  . 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 Obesity (BMI 30-39.9) 07/17/2017      Prior to Admission medications   Medication Sig Start Date End Date  Taking? Authorizing Provider  Ascorbic Acid (VITAMIN C) 1000 MG tablet Take 1,000 mg by mouth daily.    [provider]  b complex vitamins tablet Take 1 tablet by mouth daily.    [provider]  Cholecalciferol (VITAMIN D) 50 MCG (2000 UT) CAPS Take 1 capsule (2,000 Units total) by mouth daily. 08/28/19   Steele Sizer, MD  ciprofloxacin (CIPRO) 500 MG tablet Take 1 tablet (500 mg total) by mouth 2 (two) times daily for 7 days. 11/27/19 12/04/19  Merlyn Lot, MD  clobetasol cream (TEMOVATE) 3.22 % Apply 1 application topically at bedtime. 07/17/17   Arnetha Courser, MD  mesalamine (LIALDA) 1.2 g EC tablet Take 2 tablets (2.4 g total) by mouth daily. 03/07/19   Hubbard Hartshorn, FNP  metroNIDAZOLE (FLAGYL) 250 MG tablet Take 1 tablet (250 mg total) by mouth 3 (three) times daily for 7 days. 11/27/19 12/04/19  Merlyn Lot, MD  Omega-3 Fatty Acids (FISH OIL) 1000 MG CAPS Take 1 capsule by mouth daily.    [provider]  ondansetron (ZOFRAN) 4 MG tablet Take 1 tablet (4 mg total) by mouth daily as needed. 11/27/19 11/26/20  Merlyn Lot, MD  raloxifene (EVISTA) 60 MG tablet Take 1 tablet (60 mg total) by mouth daily. 08/28/19   Steele Sizer, MD  Saccharomyces boulardii (PROBIOTIC) 250 MG CAPS Take 1  capsule by mouth 3 times/day as needed-between meals & bedtime. 11/27/19   Merlyn Lot, MD  Sodium Sulfate-Mag Sulfate-KCl (SUTAB) [phone] MG TABS At 5 PM take 12 tablets using the 8 oz cup provided in the kit drinking 5 cups of water and 5 hours before your procedure repeat the same process. 11/11/19   Virgel Manifold, MD  vitamin E 100 UNIT capsule Take 100 Units by mouth daily.    [provider]    Allergies Penicillins    Social History Social History   Tobacco Use  . Smoking status: Never Smoker  . Smokeless tobacco: Never Used  . Tobacco comment: smoking cessation materials not required  Vaping Use  . Vaping Use: Never used    Substance Use Topics  . Alcohol use: No  . Drug use: No    Review of Systems Patient denies headaches, rhinorrhea, blurry vision, numbness, shortness of breath, chest pain, edema, cough, abdominal pain, nausea, vomiting, diarrhea, dysuria, fevers, rashes or hallucinations unless otherwise stated above in HPI. ____________________________________________   PHYSICAL EXAM:  VITAL SIGNS: Vitals:   11/27/19 0930 11/27/19 1030  BP: (!) 134/63 (!) 130/69  Pulse: 62 67  Resp: 14 15  Temp:    SpO2: 99% 100%    Constitutional: Alert and oriented.  Eyes: Conjunctivae are normal.  Head: Atraumatic. Nose: No congestion/rhinnorhea. Mouth/Throat: Mucous membranes are moist.   Neck: No stridor. Painless ROM.  Cardiovascular: Normal rate, regular rhythm. Grossly normal heart sounds.  Good peripheral circulation. Respiratory: Normal respiratory effort.  No retractions. Lungs CTAB. Gastrointestinal: Soft and nontender. No distention. No abdominal bruits. No CVA tenderness. Genitourinary: normal external genitalia Musculoskeletal: No lower extremity tenderness nor edema.  No joint effusions. Neurologic:  Normal speech and language. No gross focal neurologic deficits are appreciated. No facial droop Skin:  Skin is warm, dry and intact. No rash noted. Psychiatric: Mood and affect are normal. Speech and behavior are normal.  ____________________________________________   LABS (all labs ordered are listed, but only abnormal results are displayed)  Results for orders placed or performed during the hospital encounter of 11/27/19 (from the past 24 hour(s))  CBC with Differential     Status: None   Collection Time: 11/26/19 11:22 PM  Result Value Ref Range   WBC 8.8 4.0 - 10.5 K/uL   RBC 4.89 3.87 - 5.11 MIL/uL   Hemoglobin 14.0 12.0 - 15.0 g/dL   HCT 43.3 36 - 46 %   MCV 88.5 80.0 - 100.0 fL   MCH 28.6 26.0 - 34.0 pg   MCHC 32.3 30.0 - 36.0 g/dL   RDW 13.1 11.5 - 15.5 %   Platelets 307  150 - 400 K/uL   nRBC 0.0 0.0 - 0.2 %   Neutrophils Relative % 57 %   Neutro Abs 5.0 1.7 - 7.7 K/uL   Lymphocytes Relative 32 %   Lymphs Abs 2.8 0.7 - 4.0 K/uL   Monocytes Relative 8 %   Monocytes Absolute 0.7 0 - 1 K/uL   Eosinophils Relative 2 %   Eosinophils Absolute 0.2 0 - 0 K/uL   Basophils Relative 1 %   Basophils Absolute 0.1 0 - 0 K/uL   Immature Granulocytes 0 %   Abs Immature Granulocytes 0.02 0.00 - 0.07 K/uL  Comprehensive metabolic panel     Status: Abnormal   Collection Time: 11/26/19 11:22 PM  Result Value Ref Range   Sodium 142 135 - 145 mmol/L   Potassium 3.6 3.5 - 5.1 mmol/L  Chloride 103 98 - 111 mmol/L   CO2 28 22 - 32 mmol/L   Glucose, Bld 121 (H) 70 - 99 mg/dL   BUN 25 (H) 8 - 23 mg/dL   Creatinine, Ser 0.69 0.44 - 1.00 mg/dL   Calcium 9.6 8.9 - 10.3 mg/dL   Total Protein 8.2 (H) 6.5 - 8.1 g/dL   Albumin 4.4 3.5 - 5.0 g/dL   AST 23 15 - 41 U/L   ALT 24 0 - 44 U/L   Alkaline Phosphatase 63 38 - 126 U/L   Total Bilirubin 0.6 0.3 - 1.2 mg/dL   GFR calc non Af Amer >60 >60 mL/min   GFR calc Af Amer >60 >60 mL/min   Anion gap 11 5 - 15  Troponin I (High Sensitivity)     Status: None   Collection Time: 11/26/19 11:22 PM  Result Value Ref Range   Troponin I (High Sensitivity) 4 <18 ng/L  Troponin I (High Sensitivity)     Status: None   Collection Time: 11/27/19  2:41 AM  Result Value Ref Range   Troponin I (High Sensitivity) 13 <18 ng/L   ____________________________________________  EKG My review and personal interpretation at Time: 8:57   Indication: chest pain  Rate: 55  Rhythm: sinus Axis: normal normal Other: normal intervals, no stemi ____________________________________________  RADIOLOGY  I personally reviewed all radiographic images ordered to evaluate for the above acute complaints and reviewed radiology reports and findings.  These findings were personally discussed with the patient.  Please see medical record for radiology  report.  ____________________________________________   PROCEDURES  Procedure(s) performed:  Procedures    Critical Care performed: no ____________________________________________   INITIAL IMPRESSION / ASSESSMENT AND PLAN / ED COURSE  Pertinent labs & imaging results that were available during my care of the patient were reviewed by me and considered in my medical decision making (see chart for details).   DDX: acs, colitis,, diverticulitis, cystitis, pna, dissection, esophageal spasm  Shirley Lowe is a 69 y.o. who presents to the ED with symptoms as described above.  Patient currently well-appearing in no acute distress.  Given her age risk factors and description of symptoms CTA was ordered to evaluate for above differential.  No signs of mesenteric ischemia does have evidence of early uncomplicated diverticulitis.  No stricture or evidence of obstruction.  No significant white count or markers of sepsis.  Her troponins are negative and EKG is nonischemic.  Not consistent with ACS.  Will give antibiotics.  She is tolerating p.o.. Does appear appropriate for outpatient follow-up.     The patient was evaluated in Emergency Department today for the symptoms described in the history of present illness. He/she was evaluated in the context of the global COVID-19 pandemic, which necessitated consideration that the patient might be at risk for infection with the SARS-CoV-2 virus that causes COVID-19. Institutional protocols and algorithms that pertain to the evaluation of patients at risk for COVID-19 are in a state of rapid change based on information released by regulatory bodies including the CDC and federal and state organizations. These policies and algorithms were followed during the patient's care in the ED.  As part of my medical decision making, I reviewed the following data within the [place] notes reviewed and incorporated, Labs reviewed, notes from  prior ED visits and [place] Controlled Substance Database   ____________________________________________   FINAL CLINICAL IMPRESSION(S) / ED DIAGNOSES  Final diagnoses:  Diverticulitis  Chest pain, unspecified type  NEW MEDICATIONS STARTED DURING THIS VISIT:  New Prescriptions   CIPROFLOXACIN (CIPRO) 500 MG TABLET    Take 1 tablet (500 mg total) by mouth 2 (two) times daily for 7 days.   METRONIDAZOLE (FLAGYL) 250 MG TABLET    Take 1 tablet (250 mg total) by mouth 3 (three) times daily for 7 days.   ONDANSETRON (ZOFRAN) 4 MG TABLET    Take 1 tablet (4 mg total) by mouth daily as needed.   SACCHAROMYCES BOULARDII (PROBIOTIC) 250 MG CAPS    Take 1 capsule by mouth 3 times/day as needed-between meals & bedtime.     Note:  This document was prepared using Dragon voice recognition software and may include unintentional dictation errors.       Merlyn Lot, MD 11/27/19 [phone]

## 2019-11-27 NOTE — ED Triage Notes (Addendum)
Patient ambulatory to triage with steady gait, without difficulty or distress noted; pt here earlier but went home to wait; explained to pt that she would need to re-register; pt reports improved pain at this time; results are available; will repeat 2nd troponin; pt voices understanding of plan of care

## 2019-12-02 ENCOUNTER — Encounter: Payer: Self-pay | Admitting: Gastroenterology

## 2019-12-02 ENCOUNTER — Telehealth (INDEPENDENT_AMBULATORY_CARE_PROVIDER_SITE_OTHER): Payer: Medicare HMO | Admitting: Gastroenterology

## 2019-12-02 DIAGNOSIS — K5732 Diverticulitis of large intestine without perforation or abscess without bleeding: Secondary | ICD-10-CM | POA: Diagnosis not present

## 2019-12-02 DIAGNOSIS — K5190 Ulcerative colitis, unspecified, without complications: Secondary | ICD-10-CM | POA: Diagnosis not present

## 2019-12-03 NOTE — Progress Notes (Signed)
Shirley Antigua, MD [address]  [place]  [place], [place] [address]  Main: [phone]  Fax: [phone]   Primary Care Physician: Hubbard Hartshorn, FNP  Virtual Visit via Telephone Note  I connected with patient on 12/03/19 at  2:45 PM EDT by telephone and verified that I am speaking with the correct person using two identifiers.   I discussed the limitations, risks, security and privacy concerns of performing an evaluation and management service by telephone and the availability of in person appointments. I also discussed with the patient that there may be a patient responsible charge related to this service. The patient expressed understanding and agreed to proceed.  Location of Patient: Home Location of Provider: Home Persons involved: Patient and provider only during the visit (nursing staff and front desk staff was involved in communicating with the patient prior to the appointment, reviewing medications and checking them in)   History of Present Illness: Chief Complaint  Patient presents with  . Follow-up    Patient stated that she had been taking her antibiotics and starting to feel better.     HPI: Shirley Lowe is a 69 y.o. female with history of UC here for follow-up.  Colonoscopy was recently attempted but colonoscope could not be advanced past the sigmoid colon due to sharp turn at the area.  CT scan to rule out stricture was ordered, however, patient recently went to the ER with abdominal pain and was diagnosed with diverticulitis and this did not show any obstruction or strictures on the CT scan.  Patient was given antibiotics and has been taking it and has noticed a significant difference in her pain and is asymptomatic at this time.  Patient denies any blood in her stool.  No nausea or vomiting or abdominal pain at this time  Previous history:  January 2007 colonoscopy done for blood in stool and rectal bleeding: Mild localized inflammation in the  proximal ascending colon Single diverticulum at the hepatic flexure Internal hemorrhoids Multiple diverticuli from sigmoid to descending colon  January 2007 biopsies, labeled ascending colon biopsy: Changes consistent with chronic active colitis  October 2008 pathology: Diffuse severe chronic active colitis in the rectum, sigmoid, descending colon, ascending colon and cecum biopsies.  October 2008 upper GI with small bowel follow-through reported mild to moderate gastroesophageal reflux  December 2008 clinic note:  Doing well on Asacol since October 2008 Diagnosis with ulcerative colitis, stable, continue Asacol 400 mg x 2, 3 times a day  March 2017 clinic note from [facility] "History of UC, does well with Lialda.  Overall feeling well.  Bowel movements are regular, formed.  Continue Lialda.  Labs ordered.  Colonoscopy rescheduled.  Lialda 1.2 g, 2 tabs once daily were ordered  March 2017 colonoscopy 7 mm polyp found in the cecum, removed with hot biopsy forceps.  Multiple diverticula.  Internal hemorrhoids.  4 biopsies taken every 10 cm. March 2017 pathology Unremarkable colonic mucosa in the cecum, ascending colon, transverse colon, descending colon jars.  The sigmoid and rectum jar reports mucosal architectural alterations consistent with colitis and UC.  Recommendations were to repeat colonoscopy in 2 years for surveillance.  Current Outpatient Medications  Medication Sig Dispense Refill  . ciprofloxacin (CIPRO) 500 MG tablet Take 1 tablet (500 mg total) by mouth 2 (two) times daily for 7 days. 14 tablet 0  . clobetasol cream (TEMOVATE) 3.35 % Apply 1 application topically at bedtime. 30 g 5  . mesalamine (LIALDA) 1.2  g EC tablet Take 2 tablets (2.4 g total) by mouth daily. 90 tablet 0  . metroNIDAZOLE (FLAGYL) 250 MG tablet Take 1 tablet (250 mg total) by mouth 3 (three) times daily for 7 days. 21 tablet 0  . Omega-3 Fatty Acids (FISH OIL) 1000 MG CAPS Take  1 capsule by mouth daily.    . ondansetron (ZOFRAN) 4 MG tablet Take 1 tablet (4 mg total) by mouth daily as needed. 14 tablet 0  . raloxifene (EVISTA) 60 MG tablet Take 1 tablet (60 mg total) by mouth daily. 30 tablet 12  . Ascorbic Acid (VITAMIN C) 1000 MG tablet Take 1,000 mg by mouth daily. (Patient not taking: Reported on 12/02/2019)    . b complex vitamins tablet Take 1 tablet by mouth daily. (Patient not taking: Reported on 12/02/2019)    . Cholecalciferol (VITAMIN D) 50 MCG (2000 UT) CAPS Take 1 capsule (2,000 Units total) by mouth daily. (Patient not taking: Reported on 12/02/2019) 30 capsule 0  . Saccharomyces boulardii (PROBIOTIC) 250 MG CAPS Take 1 capsule by mouth 3 times/day as needed-between meals & bedtime. (Patient not taking: Reported on 12/02/2019) 60 capsule 0  . vitamin E 100 UNIT capsule Take 100 Units by mouth daily. (Patient not taking: Reported on 12/02/2019)     No current facility-administered medications for this visit.    Allergies as of 12/02/2019 - Review Complete 12/02/2019  Allergen Reaction Noted  . Penicillins  05/01/2011    Review of Systems:    All systems reviewed and negative except where noted in HPI.   Observations/Objective:  Labs: CMP     Component Value Date/Time   NA 142 11/26/2019 2322   NA 141 03/07/2019 1508   K 3.6 11/26/2019 2322   CL 103 11/26/2019 2322   CO2 28 11/26/2019 2322   GLUCOSE 121 (H) 11/26/2019 2322   BUN 25 (H) 11/26/2019 2322   BUN 19 03/07/2019 1508   CREATININE 0.69 11/26/2019 2322   CALCIUM 9.6 11/26/2019 2322   PROT 8.2 (H) 11/26/2019 2322   PROT 7.4 03/07/2019 1508   ALBUMIN 4.4 11/26/2019 2322   ALBUMIN 4.4 03/07/2019 1508   AST 23 11/26/2019 2322   ALT 24 11/26/2019 2322   ALKPHOS 63 11/26/2019 2322   BILITOT 0.6 11/26/2019 2322   BILITOT 0.5 03/07/2019 1508   GFRNONAA >60 11/26/2019 2322   GFRAA >60 11/26/2019 2322   Lab Results  Component Value Date   WBC 8.8 11/26/2019   HGB 14.0 11/26/2019   HCT 43.3  11/26/2019   MCV 88.5 11/26/2019   PLT 307 11/26/2019    Imaging Studies: DG Chest 2 View  Result Date: 11/26/2019 CLINICAL DATA:  Pain beneath the left breast EXAM: CHEST - 2 VIEW COMPARISON:  None. FINDINGS: Subsegmental atelectasis or scar at the left base. No consolidation. Borderline cardiomegaly. Aortic atherosclerosis. No pneumothorax. IMPRESSION: Subsegmental atelectasis or scar at the left base. Electronically Signed   By: Donavan Foil M.D.   On: 11/26/2019 23:38   CT Angio Chest/Abd/Pel for Dissection W and/or Wo Contrast  Result Date: 11/27/2019 CLINICAL DATA:  Chest and abdominal pain EXAM: CT ANGIOGRAPHY CHEST, ABDOMEN AND PELVIS TECHNIQUE: Non-contrast CT of the chest was initially obtained. Multidetector CT imaging through the chest, abdomen and pelvis was performed using the standard protocol during bolus administration of intravenous contrast. Multiplanar reconstructed images and MIPs were obtained and reviewed to evaluate the vascular anatomy. CONTRAST:  133m OMNIPAQUE IOHEXOL 350 MG/ML SOLN COMPARISON:  Chest radiograph November 26, 2019 FINDINGS: CTA CHEST FINDINGS Cardiovascular: There is no demonstrable intramural hematoma on noncontrast enhanced study. There is no evident thoracic aortic aneurysm or dissection. The visualized great vessels appear unremarkable without appreciable atherosclerotic plaque, aneurysm or dissection. There is slight aortic atherosclerosis. There is no appreciable narrowing of the thoracic aorta. No mucosal irregularity appreciable by CT. No pericardial effusion or pericardial thickening evident. No evident pulmonary embolus. Mediastinum/Nodes: Visualized thyroid appears normal. There is no appreciable thoracic adenopathy. No esophageal lesions are appreciable. Lungs/Pleura: There is slight bibasilar atelectasis. There is no appreciable edema or airspace opacity. No pleural effusions are evident. On axial slice 62 series 6, there is a 4 x 2 mm nodular opacity  abutting the pleura in the posteromedial aspect of the superior segment of the right lower lobe. Musculoskeletal: There is thoracic levoscoliosis. There are foci of degenerative change in the thoracic spine. No fracture or dislocation evident. No blastic or lytic bone lesions. No evident chest wall lesions. Review of the MIP images confirms the above findings. CTA ABDOMEN AND PELVIS FINDINGS VASCULAR Aorta: There is no demonstrable aortic aneurysm or dissection. No appreciable atherosclerotic irregularity. Celiac: Celiac artery and its branches show no evident aneurysm or dissection. There is mild calcification at the origin of the celiac artery with only minimal narrowing in this area. SMA: Superior mesenteric artery and its branches are widely patent. No aneurysm or dissection evident involving these vessels. Renals: Single renal artery appreciable on each side. Renal arteries and respective branches show no evident aneurysm or dissection. No fibromuscular dysplasia. No appreciable vessel narrowing or atherosclerotic irregularity. IMA: Inferior mesenteric artery and its branches are patent. No aneurysm or dissection involving these vessels. Inflow: The pelvic arterial vessels are widely patent without appreciable atherosclerotic irregularity on either side. No aneurysm or dissection involving pelvic arterial vessels. Proximal superficial femoral and profunda femoral arteries are widely patent without aneurysm or dissection involving these vessels. Veins: No obvious venous abnormality within the limitations of this arterial phase study. Review of the MIP images confirms the above findings. NON-VASCULAR Hepatobiliary: No focal liver lesions are appreciable on arterial phase imaging. Gallbladder wall is not appreciably thickened. There is no biliary duct dilatation. Pancreas: No pancreatic mass or inflammatory focus. Spleen: No splenic lesions evident. Adrenals/Urinary Tract: Adrenals bilaterally appear unremarkable.  Kidneys bilaterally show no evident mass or hydronephrosis on either side. There is no demonstrable renal or ureteral calculus on either side. Urinary bladder is midline with wall thickness within normal limits. Stomach/Bowel: There are multiple sigmoid diverticula. There is wall thickening in the mid to distal sigmoid colon with nearby soft tissue stranding and fluid along the leftward aspect of the mid sigmoid colon consistent with a degree of diverticulitis. No abscess or extraluminal air to suggest perforation evident. Elsewhere, there is no appreciable bowel wall or mesenteric thickening. There is no evident bowel obstruction. The terminal ileum appears normal. There is no appreciable free air or portal venous air. There appears to be a degree of mild rectal prolapse. Lymphatic: There is no evident adenopathy in the abdomen or pelvis. Reproductive: The uterus is absent. No evident pelvic mass. Suspect a degree of vaginal prolapse Other: The appendix appears normal. There is no evident abscess or ascites in the abdomen or pelvis. There is slight fat in the umbilicus. Musculoskeletal: No fracture or dislocation. No blastic or lytic bone lesions are evident. No intramuscular lesions. Review of the MIP images confirms the above findings. IMPRESSION: Chest CT angiogram: 1. No aneurysm or dissection. Slight aortic  atherosclerosis. No appreciable mucosal irregularity in the aorta or visualized great vessels. 2. Areas of slight atelectatic change. No edema or airspace opacity. 3 mm nodular opacity right lower lobe. No follow-up needed if patient is low-risk. Non-contrast chest CT can be considered in 12 months if patient is high-risk. This recommendation follows the consensus statement: Guidelines for Management of Incidental Pulmonary Nodules Detected on CT Images: From the Fleischner Society 2017; Radiology 2017; 284:228-243. 3.  No evident adenopathy. CT angiogram abdomen; CT angiogram pelvis: 1. No aneurysm or  dissection involving the aorta, major mesenteric, and major pelvic arterial vessels. No fibromuscular dysplasia. Minimal atherosclerotic plaque noted at the origin of the celiac artery. Other vessels appear widely patent throughout the respective courses. 2. There is evidence of a degree of diverticulitis in the mid to lower sigmoid colon. No appreciable abscess or evidence of perforation. 3. Suspect a degree of mild rectal and vaginal prolapse. Clinical assessment in this regard advised. 4. No bowel obstruction. No abscess in the abdomen or pelvis. Appendix appears normal. 5. No renal or ureteral calculus. No hydronephrosis. Urinary bladder wall thickness normal. 6.  Uterus absent. Aortic Atherosclerosis (ICD10-I70.0). Electronically Signed   By: Lowella Grip III M.D.   On: 11/27/2019 09:37    Assessment and Plan:   Shirley Lowe is a 69 y.o. y/o female here for follow-up of UC and recent diverticulitis  Assessment and Plan: Diverticulitis is clinically improved  Referred patient to [facility] to complete surveillance colonoscopy with UC surveillance biopsies since this could not be completed by me  Continue current treatment for UC as patient appears to be in clinical remission with biopsies from sigmoid colon on last colonoscopy being normal  Follow Up Instructions:    I discussed the assessment and treatment plan with the patient. The patient was provided an opportunity to ask questions and all were answered. The patient agreed with the plan and demonstrated an understanding of the instructions.   The patient was advised to call back or seek an in-person evaluation if the symptoms worsen or if the condition fails to improve as anticipated.  I provided 12 minutes of non-face-to-face time during this encounter. Additional time was spent in reviewing patient's chart, placing orders etc.   Virgel Manifold, MD  Speech recognition software was used to dictate this note.

## 2019-12-04 ENCOUNTER — Telehealth: Payer: Self-pay

## 2019-12-04 NOTE — Telephone Encounter (Signed)
Per Dr. Bonna Gains, patient is to be referred to [facility] due to ulcerative colitis surveillance. This will be sent today. They will call the patient with a date and time.

## 2019-12-15 ENCOUNTER — Ambulatory Visit
Admission: EM | Admit: 2019-12-15 | Discharge: 2019-12-15 | Disposition: A | Discharge status: Home / Self Care | Payer: Medicare HMO | Attending: Emergency Medicine | Admitting: Emergency Medicine

## 2019-12-15 ENCOUNTER — Other Ambulatory Visit: Payer: Self-pay

## 2019-12-15 ENCOUNTER — Telehealth: Payer: Self-pay | Admitting: Emergency Medicine

## 2019-12-15 DIAGNOSIS — N390 Urinary tract infection, site not specified: Secondary | ICD-10-CM

## 2019-12-15 DIAGNOSIS — N898 Other specified noninflammatory disorders of vagina: Secondary | ICD-10-CM | POA: Diagnosis not present

## 2019-12-15 LAB — POCT URINALYSIS DIP (MANUAL ENTRY)
Blood, UA: NEGATIVE
Glucose, UA: 100 mg/dL — AB
Nitrite, UA: POSITIVE — AB
Protein Ur, POC: 100 mg/dL — AB
Spec Grav, UA: 1.02 (ref 1.010–1.025)
Urobilinogen, UA: 2 E.U./dL — AB
pH, UA: 5 (ref 5.0–8.0)

## 2019-12-15 MED ORDER — CEPHALEXIN 500 MG PO CAPS: 500.0000 mg | ORAL_CAPSULE | Freq: Two times a day (BID) | ORAL | 0 refills | Status: AC

## 2019-12-15 MED ORDER — FLUCONAZOLE 150 MG PO TABS
150.0000 mg | ORAL_TABLET | Freq: Every day | ORAL | 0 refills | Status: DC
Start: 2019-12-15 — End: 2020-05-12

## 2019-12-15 NOTE — ED Triage Notes (Signed)
Patient reports urinary frequency, urgency, and vaginal itching since Friday. Patient reports she has been using AZO and OTC monistat.

## 2019-12-15 NOTE — Telephone Encounter (Signed)
Copied from [place] [id number]. Topic: General - Other >> Dec 15, 2019 10:53 AM Celene Kras wrote: Reason for CRM: Pt called stating that she is experiencing a uti. Pt was offered an appt and declined because she states that she does not have time to wait for an appt. Please advise.   Patient need appointment. Can do a virtual and bring by a urine specimen

## 2019-12-15 NOTE — ED Provider Notes (Signed)
Roderic Palau    CSN: [id number] Arrival date & time: 12/15/19  1646      History   Chief Complaint Chief Complaint  Patient presents with  . Dysuria  . Vaginal Itching    HPI Shirley Lowe is a 69 y.o. female.   Patient presents with 4-day history of dysuria, frequency, urgency.  She also reports vaginal itching and irritation.  She denies fever, chills, abdominal pain, back pain, vaginal discharge, pelvic pain, rash, lesions, or other symptoms.  Treatment attempted at home with Azo and Monistat.  Patient was seen in the ED on 11/27/2019; diagnosed with diverticulitis; treated with Cipro.  The history is provided by the patient.    Past Medical History:  Diagnosis Date  . Osteoporosis   . Ulcerative colitis     Patient Active Problem List   Diagnosis Date Noted  . History of ulcerative colitis   . Low vitamin D level 03/10/2019  . Osteoporosis 07/17/2017  . Lipodermatosclerosis 07/17/2017  . Urinary incontinence in female 07/17/2017  . 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 Obesity (BMI 30-39.9) 07/17/2017    Past Surgical History:  Procedure Laterality Date  . ABDOMINAL HYSTERECTOMY    . COLONOSCOPY WITH PROPOFOL N/A 11/12/2019   Procedure: COLONOSCOPY WITH PROPOFOL;  Surgeon: Virgel Manifold, MD;  Location: [facility];  Service: Endoscopy;  Laterality: N/A;  . INCONTINENCE SURGERY    . TONSILLECTOMY      OB History   No obstetric history on file.      Home Medications    Prior to Admission medications   Medication Sig Start Date End Date Taking? Authorizing Provider  Ascorbic Acid (VITAMIN C) 1000 MG tablet Take 1,000 mg by mouth daily. Patient not taking: Reported on 12/02/2019    [provider]  b complex vitamins tablet Take 1 tablet by mouth daily. Patient not taking: Reported on 12/02/2019    [provider]  cephALEXin (KEFLEX) 500 MG capsule Take 1 capsule (500 mg total) by mouth  2 (two) times daily for 5 days. 12/15/19 12/20/19  Sharion Balloon, NP  Cholecalciferol (VITAMIN D) 50 MCG (2000 UT) CAPS Take 1 capsule (2,000 Units total) by mouth daily. Patient not taking: Reported on 12/02/2019 08/28/19   Steele Sizer, MD  clobetasol cream (TEMOVATE) 3.30 % Apply 1 application topically at bedtime. 07/17/17   Arnetha Courser, MD  fluconazole (DIFLUCAN) 150 MG tablet Take 1 tablet (150 mg total) by mouth daily. Take one tablet today.  May repeat in 3 days. 12/15/19   Sharion Balloon, NP  mesalamine (LIALDA) 1.2 g EC tablet TAKE 1 TABLET (1.2 G TOTAL) BY MOUTH DAILY WITH BREAKFAST. 12/09/19   Virgel Manifold, MD  Omega-3 Fatty Acids (FISH OIL) 1000 MG CAPS Take 1 capsule by mouth daily.    [provider]  ondansetron (ZOFRAN) 4 MG tablet Take 1 tablet (4 mg total) by mouth daily as needed. 11/27/19 11/26/20  Merlyn Lot, MD  raloxifene (EVISTA) 60 MG tablet Take 1 tablet (60 mg total) by mouth daily. 08/28/19   Steele Sizer, MD  Saccharomyces boulardii (PROBIOTIC) 250 MG CAPS Take 1 capsule by mouth 3 times/day as needed-between meals & bedtime. Patient not taking: Reported on 12/02/2019 11/27/19   Merlyn Lot, MD  vitamin E 100 UNIT capsule Take 100 Units by mouth daily. Patient not taking: Reported on 12/02/2019    [provider]    Family History Family History  Problem Relation  Age of Onset  . Cancer Mother        breast cancer  . Breast cancer Mother 68       metastic  . Cancer Father        pancreatic  . Hepatitis C Sister   . Thyroid disease Sister   . Irritable bowel syndrome Sister   . Allergies Sister     Social History Social History   Tobacco Use  . Smoking status: Never Smoker  . Smokeless tobacco: Never Used  . Tobacco comment: smoking cessation materials not required  Vaping Use  . Vaping Use: Never used  Substance Use Topics  . Alcohol use: No  . Drug use: No     Allergies   Penicillins   Review of  Systems Review of Systems  Constitutional: Negative for chills and fever.  HENT: Negative for ear pain and sore throat.   Eyes: Negative for pain and visual disturbance.  Respiratory: Negative for cough and shortness of breath.   Cardiovascular: Negative for chest pain and palpitations.  Gastrointestinal: Negative for abdominal pain, nausea and vomiting.  Genitourinary: Positive for dysuria, frequency and urgency. Negative for flank pain, hematuria, pelvic pain and vaginal discharge.  Musculoskeletal: Negative for arthralgias and back pain.  Skin: Negative for color change and rash.  Neurological: Negative for seizures and syncope.  All other systems reviewed and are negative.    Physical Exam Triage Vital Signs ED Triage Vitals  Enc Vitals Group     BP      Pulse      Resp      Temp      Temp src      SpO2      Weight      Height      Head Circumference      Peak Flow      Pain Score      Pain Loc      Pain Edu?      Excl. in [place]?    No data found.  Updated Vital Signs BP 123/81   Pulse 73   Temp 98.9 F (37.2 C)   Resp 14   SpO2 98%   Visual Acuity Right Eye Distance:   Left Eye Distance:   Bilateral Distance:    Right Eye Near:   Left Eye Near:    Bilateral Near:     Physical Exam Vitals and nursing note reviewed.  Constitutional:      General: She is not in acute distress.    Appearance: She is well-developed. She is not ill-appearing.  HENT:     Head: Normocephalic and atraumatic.     Mouth/Throat:     Mouth: Mucous membranes are moist.     Pharynx: Oropharynx is clear.  Eyes:     Conjunctiva/sclera: Conjunctivae normal.  Cardiovascular:     Rate and Rhythm: Normal rate and regular rhythm.     Heart sounds: No murmur heard.   Pulmonary:     Effort: Pulmonary effort is normal. No respiratory distress.     Breath sounds: Normal breath sounds.  Abdominal:     Palpations: Abdomen is soft.     Tenderness: There is no abdominal tenderness.  There is no right CVA tenderness, left CVA tenderness, guarding or rebound.  Musculoskeletal:     Cervical back: Neck supple.  Skin:    General: Skin is warm and dry.     Findings: No rash.  Neurological:     General: No  focal deficit present.     Mental Status: She is alert and oriented to person, place, and time.     Gait: Gait normal.  Psychiatric:        Mood and Affect: Mood normal.        Behavior: Behavior normal.      UC Treatments / Results  Labs (all labs ordered are listed, but only abnormal results are displayed) Labs Reviewed  POCT URINALYSIS DIP (MANUAL ENTRY) - Abnormal; Notable for the following components:      Result Value   Color, UA orange (*)    Clarity, UA cloudy (*)    Glucose, UA =100 (*)    Bilirubin, UA small (*)    Ketones, POC UA small (15) (*)    Protein Ur, POC =100 (*)    Urobilinogen, UA 2.0 (*)    Nitrite, UA Positive (*)    Leukocytes, UA Large (3+) (*)    All other components within normal limits  URINE CULTURE    EKG   Radiology No results found.  Procedures Procedures (including critical care time)  Medications Ordered in UC Medications - No data to display  Initial Impression / Assessment and Plan / UC Course  I have reviewed the triage vital signs and the nursing notes.  Pertinent labs & imaging results that were available during my care of the patient were reviewed by me and considered in my medical decision making (see chart for details).   UTI, vaginal irritation.  Urine culture pending.  Treating with Keflex and Diflucan.  Discussed with patient that we will call her if the urine culture shows the need to change or discontinue her antibiotic.  Instructed her to follow-up with her PCP if her symptoms are not improving.  Patient agrees to plan of care.     Final Clinical Impressions(s) / UC Diagnoses   Final diagnoses:  Urinary tract infection without hematuria, site unspecified  Vaginal irritation     Discharge  Instructions     Take the Keflex and Diflucan as directed.    Urine culture is pending.  We will call you if it shows the need to change or discontinue your antibiotic.    Follow up with your primary care provider if your symptoms are not improving.       ED Prescriptions    Medication Sig Dispense Auth. Provider   cephALEXin (KEFLEX) 500 MG capsule Take 1 capsule (500 mg total) by mouth 2 (two) times daily for 5 days. 10 capsule Sharion Balloon, NP   fluconazole (DIFLUCAN) 150 MG tablet Take 1 tablet (150 mg total) by mouth daily. Take one tablet today.  May repeat in 3 days. 1 tablet Sharion Balloon, NP     PDMP not reviewed this encounter.   Sharion Balloon, NP 12/15/19 1725

## 2019-12-15 NOTE — Discharge Instructions (Signed)
Take the Keflex and Diflucan as directed.    Urine culture is pending.  We will call you if it shows the need to change or discontinue your antibiotic.    Follow up with your primary care provider if your symptoms are not improving.

## 2019-12-15 NOTE — Telephone Encounter (Signed)
Spoke with pt and she is taking Avo for the UTI and it developed a yeast infection. She has taken the [facility] for the yeast infection but its not helping. She is more concerned about the yeast infection. Pt would like to know if you could just call her something in for the yeast infection.  I have looked on the schedule and we are completely booked even for a virtual visit. Please advise as to where to put her? She is hoping for some type of relieve today

## 2019-12-17 ENCOUNTER — Telehealth: Payer: Medicare HMO | Admitting: Family Medicine

## 2019-12-19 ENCOUNTER — Telehealth (HOSPITAL_COMMUNITY): Payer: Self-pay | Admitting: Emergency Medicine

## 2019-12-19 LAB — URINE CULTURE: Culture: >=100000 — AB

## 2019-12-19 MED ORDER — SULFAMETHOXAZOLE-TRIMETHOPRIM 800-160 MG PO TABS: 1.0000 | ORAL_TABLET | Freq: Two times a day (BID) | ORAL | 0 refills | Status: AC

## 2020-02-05 ENCOUNTER — Telehealth: Payer: Self-pay | Admitting: Family Medicine

## 2020-02-05 NOTE — Telephone Encounter (Signed)
Just FYI see below. PCP removed.

## 2020-02-05 NOTE — Telephone Encounter (Signed)
Patient declined the Medicare Wellness Visit with [facility].   Patient is currently looking for another PCP due to not being able to reach the office in a timely manner when she calls. Felt she was not getting the care she needed when she got an UTI.   She asked not to be contacted again about making any type of appointment with [facility].

## 2020-04-13 ENCOUNTER — Other Ambulatory Visit: Payer: Self-pay | Admitting: Gastroenterology

## 2020-04-13 DIAGNOSIS — K51311 Ulcerative (chronic) rectosigmoiditis with rectal bleeding: Secondary | ICD-10-CM

## 2020-05-10 ENCOUNTER — Telehealth: Payer: Self-pay

## 2020-05-10 ENCOUNTER — Other Ambulatory Visit: Payer: Self-pay

## 2020-05-10 ENCOUNTER — Inpatient Hospital Stay
Admission: EM | Admit: 2020-05-10 | Discharge: 2020-05-12 | DRG: 392 | Disposition: A | Discharge status: Home / Self Care | Payer: Medicare HMO | Attending: Internal Medicine | Admitting: Internal Medicine

## 2020-05-10 ENCOUNTER — Encounter: Payer: Self-pay | Admitting: Gastroenterology

## 2020-05-10 ENCOUNTER — Telehealth: Payer: Self-pay | Admitting: Gastroenterology

## 2020-05-10 ENCOUNTER — Inpatient Hospital Stay
Admission: RE | Admit: 2020-05-10 | Discharge: 2020-05-10 | Disposition: A | Discharge status: Home / Self Care | Payer: Medicare HMO | Source: Ambulatory Visit | Attending: Gastroenterology | Admitting: Gastroenterology

## 2020-05-10 DIAGNOSIS — Z880 Allergy status to penicillin: Secondary | ICD-10-CM

## 2020-05-10 DIAGNOSIS — Z6828 Body mass index (BMI) 28.0-28.9, adult: Secondary | ICD-10-CM | POA: Diagnosis not present

## 2020-05-10 DIAGNOSIS — R10814 Left lower quadrant abdominal tenderness: Secondary | ICD-10-CM | POA: Diagnosis not present

## 2020-05-10 DIAGNOSIS — Z8719 Personal history of other diseases of the digestive system: Secondary | ICD-10-CM

## 2020-05-10 DIAGNOSIS — E669 Obesity, unspecified: Secondary | ICD-10-CM | POA: Diagnosis present

## 2020-05-10 DIAGNOSIS — Z8349 Family history of other endocrine, nutritional and metabolic diseases: Secondary | ICD-10-CM | POA: Diagnosis not present

## 2020-05-10 DIAGNOSIS — Z20822 Contact with and (suspected) exposure to covid-19: Secondary | ICD-10-CM | POA: Diagnosis present

## 2020-05-10 DIAGNOSIS — E663 Overweight: Secondary | ICD-10-CM | POA: Diagnosis present

## 2020-05-10 DIAGNOSIS — Z79899 Other long term (current) drug therapy: Secondary | ICD-10-CM

## 2020-05-10 DIAGNOSIS — R1084 Generalized abdominal pain: Secondary | ICD-10-CM

## 2020-05-10 DIAGNOSIS — K5720 Diverticulitis of large intestine with perforation and abscess without bleeding: Secondary | ICD-10-CM | POA: Diagnosis present

## 2020-05-10 DIAGNOSIS — K5792 Diverticulitis of intestine, part unspecified, without perforation or abscess without bleeding: Secondary | ICD-10-CM | POA: Diagnosis present

## 2020-05-10 LAB — CBC WITH DIFFERENTIAL/PLATELET
Abs Immature Granulocytes: 0.02 10*3/uL (ref 0.00–0.07)
Basophils Absolute: 0 10*3/uL (ref 0.0–0.1)
Basophils Relative: 1 %
Eosinophils Absolute: 0.1 10*3/uL (ref 0.0–0.5)
Eosinophils Relative: 2 %
HCT: 44.3 % (ref 36.0–46.0)
Hemoglobin: 14.3 g/dL (ref 12.0–15.0)
Immature Granulocytes: 0 %
Lymphocytes Relative: 32 %
Lymphs Abs: 2.1 10*3/uL (ref 0.7–4.0)
MCH: 28.5 pg (ref 26.0–34.0)
MCHC: 32.3 g/dL (ref 30.0–36.0)
MCV: 88.2 fL (ref 80.0–100.0)
Monocytes Absolute: 0.5 10*3/uL (ref 0.1–1.0)
Monocytes Relative: 8 %
Neutro Abs: 3.7 10*3/uL (ref 1.7–7.7)
Neutrophils Relative %: 57 %
Platelets: 260 10*3/uL (ref 150–400)
RBC: 5.02 MIL/uL (ref 3.87–5.11)
RDW: 13.3 % (ref 11.5–15.5)
WBC: 6.5 10*3/uL (ref 4.0–10.5)
nRBC: 0 % (ref 0.0–0.2)

## 2020-05-10 LAB — COMPREHENSIVE METABOLIC PANEL
ALT: 125 U/L — ABNORMAL HIGH (ref 0–44)
AST: 36 U/L (ref 15–41)
Albumin: 4.3 g/dL (ref 3.5–5.0)
Alkaline Phosphatase: 89 U/L (ref 38–126)
Anion gap: 13 (ref 5–15)
BUN: 17 mg/dL (ref 8–23)
CO2: 27 mmol/L (ref 22–32)
Calcium: 9.9 mg/dL (ref 8.9–10.3)
Chloride: 99 mmol/L (ref 98–111)
Creatinine, Ser: 0.53 mg/dL (ref 0.44–1.00)
GFR, Estimated: >60 mL/min (ref >60)
Glucose, Bld: 90 mg/dL (ref 70–99)
Potassium: 3.9 mmol/L (ref 3.5–5.1)
Sodium: 139 mmol/L (ref 135–145)
Total Bilirubin: 0.8 mg/dL (ref 0.3–1.2)
Total Protein: 8.4 g/dL — ABNORMAL HIGH (ref 6.5–8.1)

## 2020-05-10 LAB — LACTIC ACID, PLASMA: Lactic Acid, Venous: 0.9 mmol/L (ref 0.5–1.9)

## 2020-05-10 LAB — POCT I-STAT CREATININE: Creatinine, Ser: 0.7 mg/dL (ref 0.44–1.00)

## 2020-05-10 IMAGING — CT CT ABD-PELV W/ CM
2 of 5 series · 14 of 46 positions shown, 16 images · IV contrast (APPLIED)
Comparison: Prior CTs 11/27/2019.

CLINICAL DATA: Abdominal pain radiating into the back for the past
week. Diverticulitis suspected.

EXAM:
CT ABDOMEN AND PELVIS WITH CONTRAST
TECHNIQUE: Multidetector CT imaging of the abdomen and pelvis was performed
using the standard protocol following bolus administration of
intravenous contrast.
CONTRAST:  100mL OMNIPAQUE IOHEXOL 300 MG/ML  SOLN

[Series 2: axial st · axial · 0.69mm/px · z∈[-929,-529]mm · 11 of 90 slices shown, 13 images]
[im 5/90  soft-tissue]
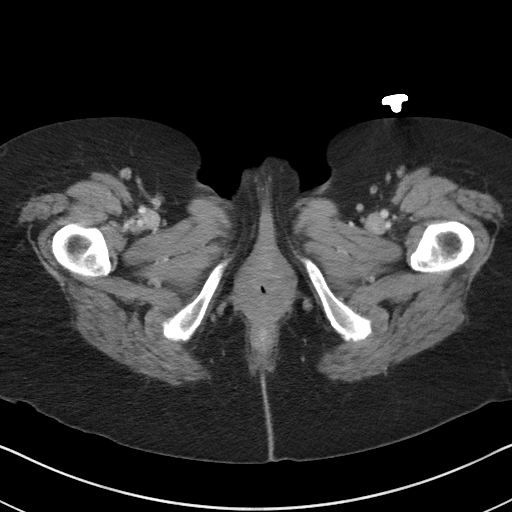
[im 5/90  bone]
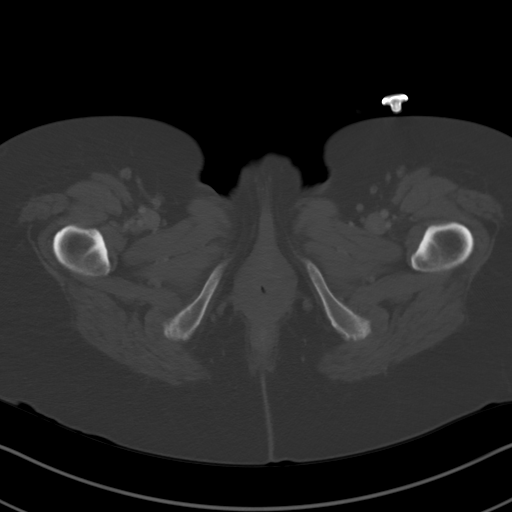
[im 15/90  soft-tissue]
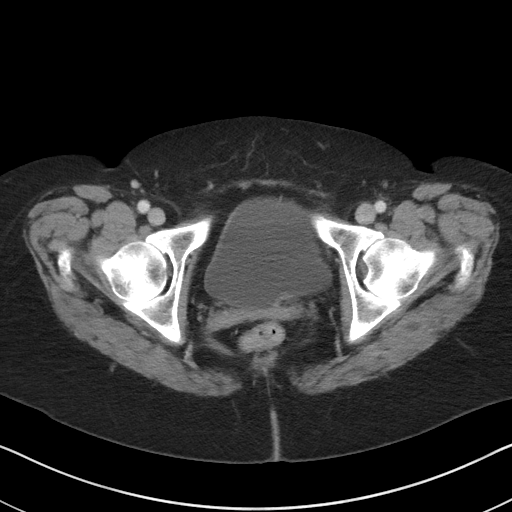
[im 20/90  soft-tissue]
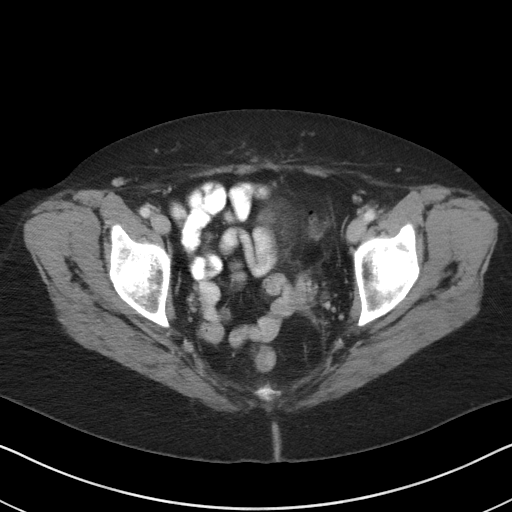
[im 30/90  soft-tissue]
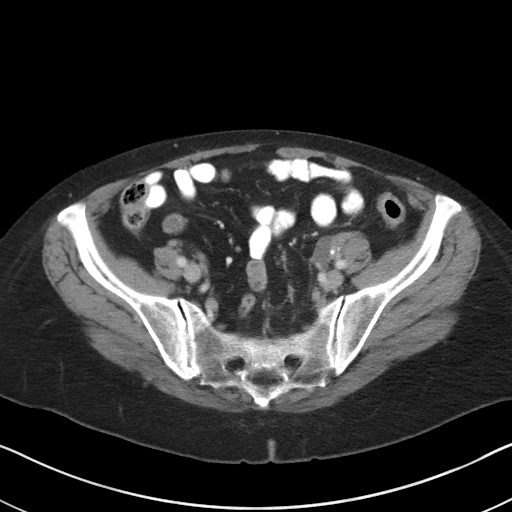
[im 35/90  soft-tissue]
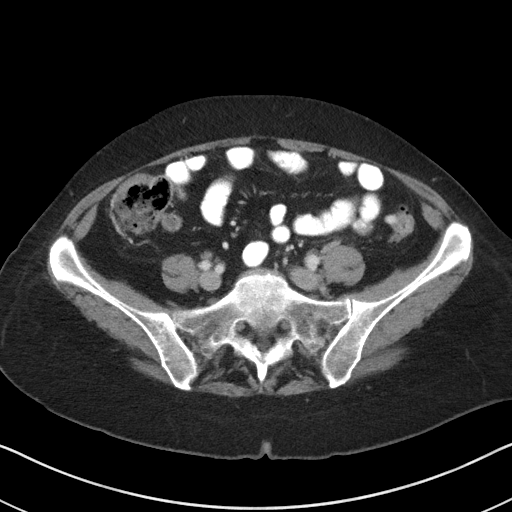
[im 45/90  soft-tissue]
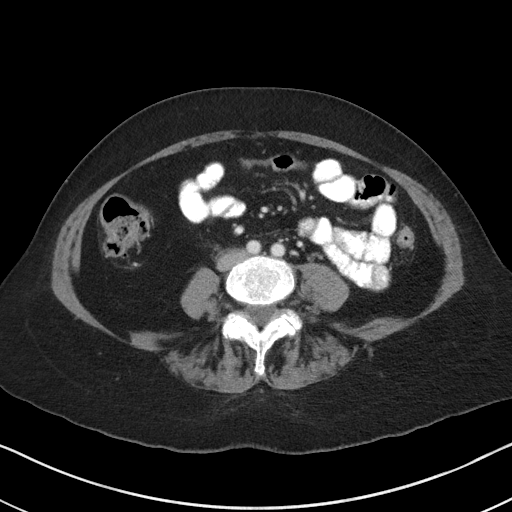
[im 55/90  soft-tissue]
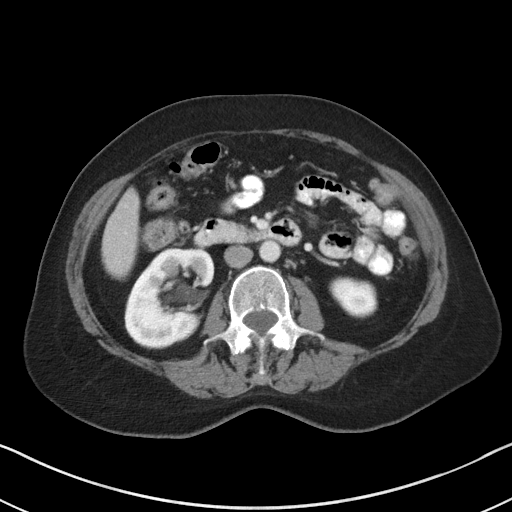
[im 60/90  soft-tissue]
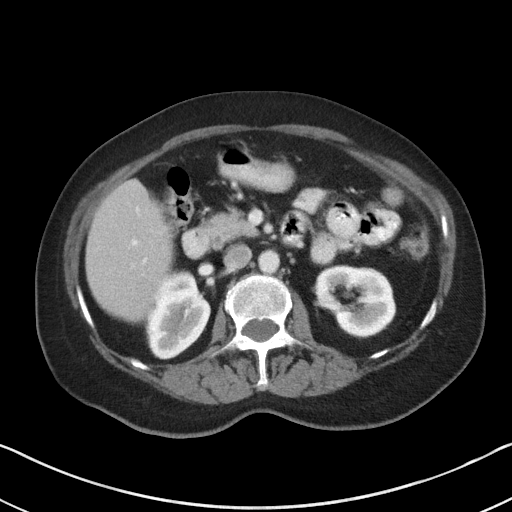
[im 70/90  soft-tissue]
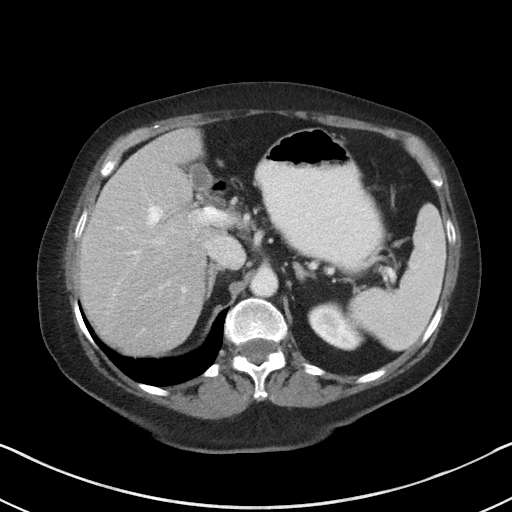
[im 70/90  bone]
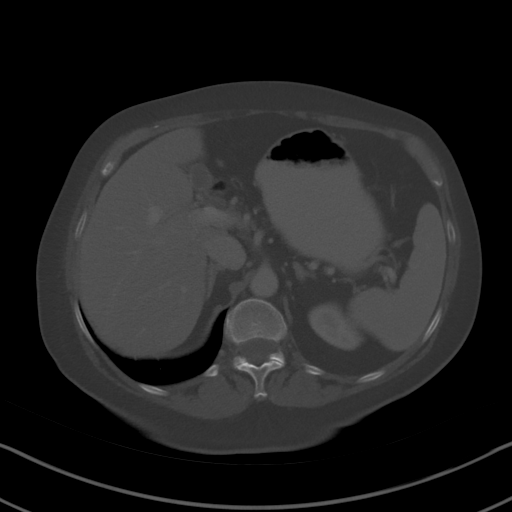
[im 75/90  soft-tissue]
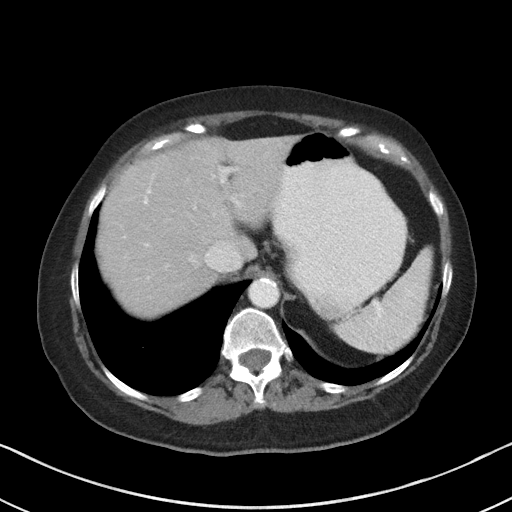
[im 85/90  soft-tissue]
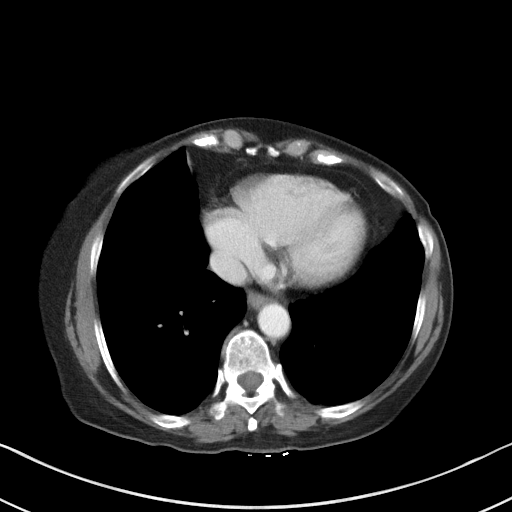

[Series 5: coronal st · coronal · 0.71mm/px · 3 of 91 slices shown]
[im 31/91  soft-tissue]
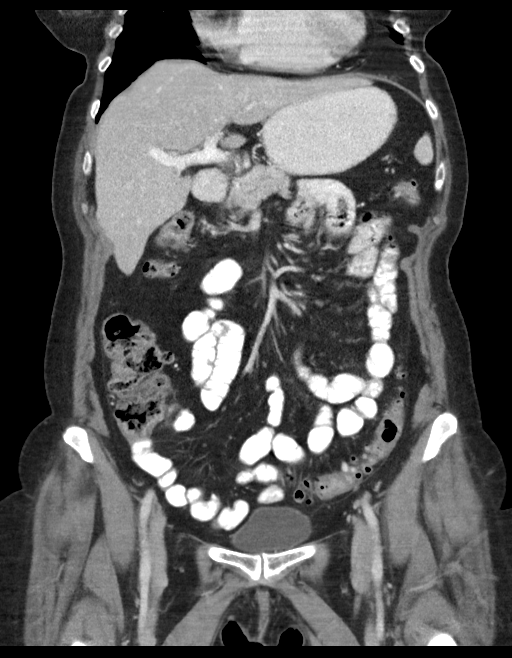
[im 41/91  soft-tissue]
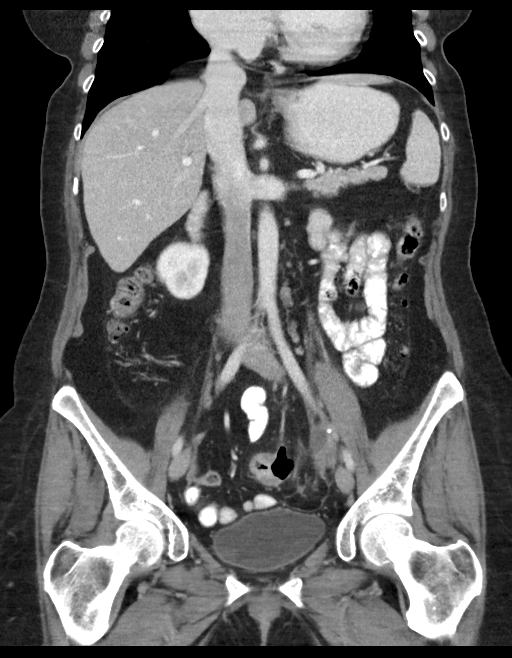
[im 51/91  soft-tissue]
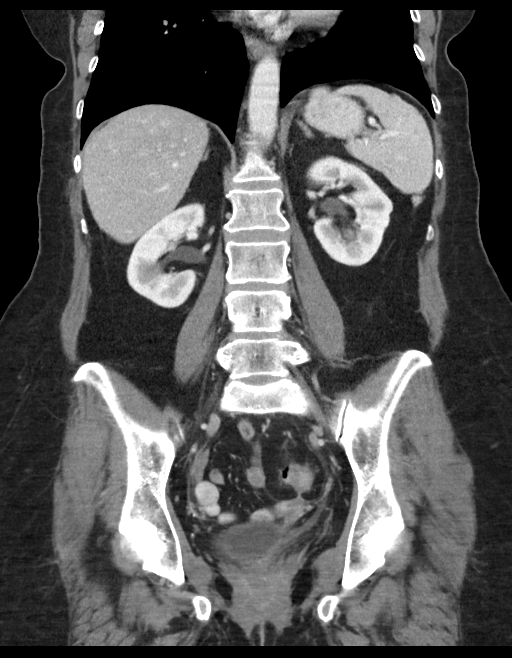

[14 of 46 positions shown; findings below may reference images not displayed]

FINDINGS: Lower chest: Images through the lower chest are mildly degraded by
breathing artifact. The lung bases appear clear. There is no pleural
or pericardial effusion.

Hepatobiliary: Hypervascular lesion in the dome of the left hepatic
lobe measuring 1.4 x 0.9 cm on image [DATE] is unchanged, probably a
flash filling hemangioma or other incidental vascular malformation
fed by hepatic arterial branches. There is a stable 8 mm low-density
lesion in the right hepatic lobe on image [DATE]. No evidence of
gallstones, gallbladder wall thickening or biliary dilatation.

Pancreas: Unremarkable. No pancreatic ductal dilatation or
surrounding inflammatory changes.

Spleen: Normal in size without focal abnormality.

Adrenals/Urinary Tract: Both adrenal glands appear normal. Small
bilateral renal cysts. No evidence of enhancing renal mass, urinary
tract calculus or hydronephrosis. The bladder appears normal.

Stomach/Bowel: Enteric contrast was administered and has passed into
the distal small bowel. The stomach, small bowel and appendix appear
normal. There are diverticular changes throughout the colon. These
are most advanced in the sigmoid colon where there is associated
wall thickening and surrounding inflammatory changes. There is a
small extraluminal air fluid collection adjacent to the sigmoid
colon, measuring 2.5 x 2.3 cm on image 66/2, consistent with a small
contained perforation. No drainable abscess identified at this time.
No evidence of bowel obstruction or extravasation of enteric
contrast.

Vascular/Lymphatic: There are no enlarged abdominal or pelvic lymph
nodes. No significant vascular findings. Minimal aortic
atherosclerosis. The portal, superior mesenteric and splenic veins
are patent.

Reproductive: Hysterectomy. Stable low-density adnexal lesions
bilaterally, measuring 2.0 cm on the right (image 66/2) and 1.8 cm
on the left (image 63/2). Process on the left is near the sigmoid
colon inflammation, although appears separate from it.

Other: No ascites or free air. Small umbilical hernia containing
only fat.

Musculoskeletal: No acute or significant osseous findings. Mild
lumbar spondylosis.
IMPRESSION: 1. Recurrent acute sigmoid colon diverticulitis with small contained
perforation. No drainable abscess identified at this time. No
evidence of bowel obstruction or extravasation of enteric contrast.
2. Stable small low-density adnexal lesions bilaterally from prior
CT of 5 months ago, likely cysts. No follow-up imaging recommended.
Note: This recommendation does not apply to premenarchal patients
and to those with increased risk (genetic, family history, elevated
tumor markers or other high-risk factors) of ovarian cancer.
Reference: JACR [DATE]):[id number]
3. Stable hypervascular lesion in the dome of the left hepatic lobe,
likely a hepatic arterial vascular malformation or atypical flash
filling hemangioma.
4. Aortic Atherosclerosis ([id number].T).
5. These results will be called to the ordering clinician or
representative by the Radiologist Assistant, and communication
documented in the PACS or [REDACTED].

## 2020-05-10 MED ORDER — ACETAMINOPHEN 650 MG RE SUPP: 650.0000 mg | Freq: Four times a day (QID) | RECTAL | Status: DC | PRN

## 2020-05-10 MED ORDER — SODIUM CHLORIDE 0.9 % IV SOLN
1.0000 g | Freq: Once | INTRAVENOUS | Status: AC
  Administered 2020-05-10: 1 g via INTRAVENOUS
  Filled 2020-05-10: qty 10

## 2020-05-10 MED ORDER — IOHEXOL 300 MG/ML  SOLN
100.0000 mL | Freq: Once | INTRAMUSCULAR | Status: AC | PRN
  Administered 2020-05-10: 100 mL via INTRAVENOUS

## 2020-05-10 MED ORDER — ENOXAPARIN SODIUM 40 MG/0.4ML ~~LOC~~ SOLN
40.0000 mg | SUBCUTANEOUS | Status: DC
  Administered 2020-05-11 – 2020-05-12 (×2): 40 mg via SUBCUTANEOUS
  Filled 2020-05-10 (×2): qty 0.4

## 2020-05-10 MED ORDER — LACTATED RINGERS IV BOLUS
1000.0000 mL | Freq: Once | INTRAVENOUS | Status: AC
  Administered 2020-05-11: 1000 mL via INTRAVENOUS

## 2020-05-10 MED ORDER — METRONIDAZOLE IN NACL 5-0.79 MG/ML-% IV SOLN
500.0000 mg | Freq: Once | INTRAVENOUS | Status: AC
  Administered 2020-05-11: 500 mg via INTRAVENOUS
  Filled 2020-05-10: qty 100

## 2020-05-10 MED ORDER — ACETAMINOPHEN 325 MG PO TABS: 650.0000 mg | ORAL_TABLET | Freq: Four times a day (QID) | ORAL | Status: DC | PRN

## 2020-05-10 MED ORDER — ONDANSETRON HCL 4 MG/2ML IJ SOLN: 4.0000 mg | Freq: Four times a day (QID) | INTRAMUSCULAR | Status: DC | PRN

## 2020-05-10 MED ORDER — ONDANSETRON HCL 4 MG PO TABS: 4.0000 mg | ORAL_TABLET | Freq: Four times a day (QID) | ORAL | Status: DC | PRN

## 2020-05-10 MED ORDER — LACTATED RINGERS IV SOLN: INTRAVENOUS | Status: DC

## 2020-05-10 MED ORDER — SODIUM CHLORIDE 0.9 % IV SOLN: INTRAVENOUS | Status: DC

## 2020-05-10 NOTE — Telephone Encounter (Signed)
Called patient to let her know that her CT Scan was scheduled to be done today at the [facility]. I also told her that she was not to eat or drink anything. Patient stated that she would go and have her Ct Scan.

## 2020-05-10 NOTE — Progress Notes (Signed)
I spoke to the patient and explained the results. Due to the reported contained perforation I have recommended patient go to the ER. I did try calling the [facility] direct admissions line and spoke to [facility], who informed me that Dr. Martinique made it a rule that direct admissions are currently on hold. Therefore pt unable to be direct admitted at this time and agrees to go to the ER.

## 2020-05-10 NOTE — ED Provider Notes (Signed)
[facility] Emergency Department Provider Note  ____________________________________________  Time seen: Approximately 11:31 PM  I have reviewed the triage vital signs and the nursing notes.   HISTORY  Chief Complaint Abnormal CT   HPI Shirley Lowe is a 70 y.o. female who presents for an abnormal CT scan. Patient saw her GI doctor this week for abdominal pain and was sent for CT as an outpatient. The CT showed acute diverticulitis with a small contained perforation but no abscess. Patient reports that her pain started a week ago. She switch her diet to broth only and the pain has been pretty well contained. 2 days ago she tried advance her diet and had another bout of severe pain. No fever, no nausea or vomiting, no chest pain or shortness of breath. Currently she reports that her pain is 0 out of 10.   Past Medical History:  Diagnosis Date  . Osteoporosis   . Ulcerative colitis     Patient Active Problem List   Diagnosis Date Noted  . History of ulcerative colitis   . Low vitamin D level 03/10/2019  . Osteoporosis 07/17/2017  . Lipodermatosclerosis 07/17/2017  . Urinary incontinence in female 07/17/2017  . 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 Obesity (BMI 30-39.9) 07/17/2017    Past Surgical History:  Procedure Laterality Date  . ABDOMINAL HYSTERECTOMY    . COLONOSCOPY WITH PROPOFOL N/A 11/12/2019   Procedure: COLONOSCOPY WITH PROPOFOL;  Surgeon: Virgel Manifold, MD;  Location: [facility] ENDOSCOPY;  Service: Endoscopy;  Laterality: N/A;  . INCONTINENCE SURGERY    . TONSILLECTOMY      Prior to Admission medications   Medication Sig Start Date End Date Taking? Authorizing Provider  Ascorbic Acid (VITAMIN C) 1000 MG tablet Take 1,000 mg by mouth daily. Patient not taking: Reported on 12/02/2019    [provider]  b complex vitamins tablet Take 1 tablet by mouth daily. Patient not taking: Reported on  12/02/2019    [provider]  Cholecalciferol (VITAMIN D) 50 MCG (2000 UT) CAPS Take 1 capsule (2,000 Units total) by mouth daily. Patient not taking: Reported on 12/02/2019 08/28/19   Steele Sizer, MD  clobetasol cream (TEMOVATE) 5.63 % Apply 1 application topically at bedtime. 07/17/17   Arnetha Courser, MD  fluconazole (DIFLUCAN) 150 MG tablet Take 1 tablet (150 mg total) by mouth daily. Take one tablet today.  May repeat in 3 days. 12/15/19   Sharion Balloon, NP  mesalamine (LIALDA) 1.2 g EC tablet TAKE 1 TABLET (1.2 G TOTAL) BY MOUTH IN THE MORNING AND AT BEDTIME. 04/19/20   Virgel Manifold, MD  Omega-3 Fatty Acids (FISH OIL) 1000 MG CAPS Take 1 capsule by mouth daily.    [provider]  ondansetron (ZOFRAN) 4 MG tablet Take 1 tablet (4 mg total) by mouth daily as needed. 11/27/19 11/26/20  Merlyn Lot, MD  raloxifene (EVISTA) 60 MG tablet Take 1 tablet (60 mg total) by mouth daily. 08/28/19   Steele Sizer, MD  Saccharomyces boulardii (PROBIOTIC) 250 MG CAPS Take 1 capsule by mouth 3 times/day as needed-between meals & bedtime. Patient not taking: Reported on 12/02/2019 11/27/19   Merlyn Lot, MD  vitamin E 100 UNIT capsule Take 100 Units by mouth daily. Patient not taking: Reported on 12/02/2019    [provider]    Allergies Penicillins  Family History  Problem Relation Age of Onset  . Cancer Mother  breast cancer  . Breast cancer Mother 46       metastic  . Cancer Father        pancreatic  . Hepatitis C Sister   . Thyroid disease Sister   . Irritable bowel syndrome Sister   . Allergies Sister     Social History Social History   Tobacco Use  . Smoking status: Never Smoker  . Smokeless tobacco: Never Used  . Tobacco comment: smoking cessation materials not required  Vaping Use  . Vaping Use: Never used  Substance Use Topics  . Alcohol use: No  . Drug use: No    Review of Systems  Constitutional: Negative for fever. Eyes:  Negative for visual changes. ENT: Negative for sore throat. Neck: No neck pain  Cardiovascular: Negative for chest pain. Respiratory: Negative for shortness of breath. Gastrointestinal: + abdominal pain. No vomiting or diarrhea. Genitourinary: Negative for dysuria. Musculoskeletal: Negative for back pain. Skin: Negative for rash. Neurological: Negative for headaches, weakness or numbness. Psych: No SI or HI  ____________________________________________   PHYSICAL EXAM:  VITAL SIGNS: ED Triage Vitals  Enc Vitals Group     BP 05/10/20 1644 125/63     Pulse Rate 05/10/20 1644 65     Resp 05/10/20 1644 16     Temp 05/10/20 1644 98.2 F (36.8 C)     Temp Source 05/10/20 1644 Oral     SpO2 05/10/20 1644 97 %     Weight 05/10/20 1644 168 lb (76.2 kg)     Height 05/10/20 1644 5' 4.5" (1.638 m)     Head Circumference --      Peak Flow --      Pain Score 05/10/20 1656 0     Pain Loc --      Pain Edu? --      Excl. in [place]? --     Constitutional: Alert and oriented. Well appearing and in no apparent distress. HEENT:      Head: Normocephalic and atraumatic.         Eyes: Conjunctivae are normal. Sclera is non-icteric.       Mouth/Throat: Mucous membranes are moist.       Neck: Supple with no signs of meningismus. Cardiovascular: Regular rate and rhythm. No murmurs, gallops, or rubs. 2+ symmetrical distal pulses are present in all extremities. No JVD. Respiratory: Normal respiratory effort. Lungs are clear to auscultation bilaterally. No wheezes, crackles, or rhonchi.  Gastrointestinal: Soft, non tender, and non distended with positive bowel sounds. No rebound or guarding. Musculoskeletal:  No edema, cyanosis, or erythema of extremities. Neurologic: Normal speech and language. Face is symmetric. Moving all extremities. No gross focal neurologic deficits are appreciated. Skin: Skin is warm, dry and intact. No rash noted. Psychiatric: Mood and affect are normal. Speech and behavior  are normal.  ____________________________________________   LABS (all labs ordered are listed, but only abnormal results are displayed)  Labs Reviewed  COMPREHENSIVE METABOLIC PANEL - Abnormal; Notable for the following components:      Result Value   Total Protein 8.4 (*)    ALT 125 (*)    All other components within normal limits  RESP PANEL BY RT-PCR (FLU A&B, COVID) ARPGX2  CBC WITH DIFFERENTIAL/PLATELET  LACTIC ACID, PLASMA  LACTIC ACID, PLASMA  URINALYSIS, COMPLETE (UACMP) WITH MICROSCOPIC   ____________________________________________  EKG  none  ____________________________________________  RADIOLOGY  I have personally reviewed the images performed during this visit and I agree with the Radiologist's read.   Interpretation  by Radiologist:  CT ABDOMEN PELVIS W CONTRAST  Result Date: 05/10/2020 CLINICAL DATA:  Abdominal pain radiating into the back for the past week. Diverticulitis suspected. EXAM: CT ABDOMEN AND PELVIS WITH CONTRAST TECHNIQUE: Multidetector CT imaging of the abdomen and pelvis was performed using the standard protocol following bolus administration of intravenous contrast. CONTRAST:  132m OMNIPAQUE IOHEXOL 300 MG/ML  SOLN COMPARISON:  Prior CTs 11/27/2019. FINDINGS: Lower chest: Images through the lower chest are mildly degraded by breathing artifact. The lung bases appear clear. There is no pleural or pericardial effusion. Hepatobiliary: Hypervascular lesion in the dome of the left hepatic lobe measuring 1.4 x 0.9 cm on image 11/2 is unchanged, probably a flash filling hemangioma or other incidental vascular malformation fed by hepatic arterial branches. There is a stable 8 mm low-density lesion in the right hepatic lobe on image 17/2. No evidence of gallstones, gallbladder wall thickening or biliary dilatation. Pancreas: Unremarkable. No pancreatic ductal dilatation or surrounding inflammatory changes. Spleen: Normal in size without focal abnormality.  Adrenals/Urinary Tract: Both adrenal glands appear normal. Small bilateral renal cysts. No evidence of enhancing renal mass, urinary tract calculus or hydronephrosis. The bladder appears normal. Stomach/Bowel: Enteric contrast was administered and has passed into the distal small bowel. The stomach, small bowel and appendix appear normal. There are diverticular changes throughout the colon. These are most advanced in the sigmoid colon where there is associated wall thickening and surrounding inflammatory changes. There is a small extraluminal air fluid collection adjacent to the sigmoid colon, measuring 2.5 x 2.3 cm on image 66/2, consistent with a small contained perforation. No drainable abscess identified at this time. No evidence of bowel obstruction or extravasation of enteric contrast. Vascular/Lymphatic: There are no enlarged abdominal or pelvic lymph nodes. No significant vascular findings. Minimal aortic atherosclerosis. The portal, superior mesenteric and splenic veins are patent. Reproductive: Hysterectomy. Stable low-density adnexal lesions bilaterally, measuring 2.0 cm on the right (image 66/2) and 1.8 cm on the left (image 63/2). Process on the left is near the sigmoid colon inflammation, although appears separate from it. Other: No ascites or free air. Small umbilical hernia containing only fat. Musculoskeletal: No acute or significant osseous findings. Mild lumbar spondylosis. IMPRESSION: 1. Recurrent acute sigmoid colon diverticulitis with small contained perforation. No drainable abscess identified at this time. No evidence of bowel obstruction or extravasation of enteric contrast. 2. Stable small low-density adnexal lesions bilaterally from prior CT of 5 months ago, likely cysts. No follow-up imaging recommended. Note: This recommendation does not apply to premenarchal patients and to those with increased risk (genetic, family history, elevated tumor markers or other high-risk factors) of ovarian  cancer. Reference: JACR 2020 Feb; 17(2):248-254 3. 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 4. Aortic Atherosclerosis (ICD10-I70.0). 5. These results will be called to the ordering clinician or representative by the Radiologist Assistant, and communication documented in the PACS or [employer] Electronically Signed   By: WRichardean SaleM.D.   On: 05/10/2020 14:29     ____________________________________________   PROCEDURES  Procedure(s) performed:yes .1-3 Lead EKG Interpretation Performed by: VRudene Re MD Authorized by: VRudene Re MD     Interpretation: normal     ECG rate assessment: normal     Rhythm: sinus rhythm     Ectopy: none     Critical Care performed: yes  CRITICAL CARE Performed by: CRudene Re ?  Total critical care time: 30  min  Critical care time was exclusive of separately billable procedures and treating other patients.  Critical care was necessary to treat or prevent imminent or life-threatening deterioration.  Critical care was time spent personally by me on the following activities: development of treatment plan with patient and/or surrogate as well as nursing, discussions with consultants, evaluation of patient's response to treatment, examination of patient, obtaining history from patient or surrogate, ordering and performing treatments and interventions, ordering and review of laboratory studies, ordering and review of radiographic studies, pulse oximetry and re-evaluation of patient's condition.  ____________________________________________   INITIAL IMPRESSION / ASSESSMENT AND PLAN / ED COURSE  70 y.o. female who was sent to the emergency room by her GI doctor after having an outpatient CT scan consistent with acute diverticulitis with a small contained perforation. No signs of sepsis. Patient is hemodynamically  stable, afebrile, no tachycardia. Labs showing no leukocytosis normal lactic acid. On exam she is well-appearing and in no distress, abdomen is soft with no tenderness rebound or guarding. Discussed with Dr. Celine Ahr from surgery who recommended admission to the hospitalist service, n.p.o., IV fluids, and IV antibiotics. Will consult hospitalist. Patient will be given IV Rocephin and Flagyl. Old medical records reviewed including CT done as an outpatient and notes from patient's GI doctor. Patient placed on telemetry for close monitoring of vital signs.      _____________________________________________ Please note:  Patient was evaluated in Emergency Department today for the symptoms described in the history of present illness. Patient was evaluated in the context of the global COVID-19 pandemic, which necessitated consideration that the patient might be at risk for infection with the SARS-CoV-2 virus that causes COVID-19. Institutional protocols and algorithms that pertain to the evaluation of patients at risk for COVID-19 are in a state of rapid change based on information released by regulatory bodies including the CDC and federal and state organizations. These policies and algorithms were followed during the patient's care in the ED.  Some ED evaluations and interventions may be delayed as a result of limited staffing during the pandemic.   [place] Controlled Substance Database was reviewed by me. ____________________________________________   FINAL CLINICAL IMPRESSION(S) / ED DIAGNOSES   Final diagnoses:  Diverticulitis of colon with perforation      NEW MEDICATIONS STARTED DURING THIS VISIT:  ED Discharge Orders    None       Note:  This document was prepared using Dragon voice recognition software and may include unintentional dictation errors.    Rudene Re, MD 05/10/20 [phone]

## 2020-05-10 NOTE — Telephone Encounter (Signed)
Called patient to let her know that after Dr. Bonna Gains reviewed her CT Scan, she wants her to go to the ED because she has a small perforation on her sigmoid colon due to diverticulitis. Patient agreed and had no further questions.

## 2020-05-10 NOTE — Telephone Encounter (Signed)
Needs refill for medication to treat Diverticulitis, please call to advise

## 2020-05-10 NOTE — Telephone Encounter (Signed)
Dr. Bonna Gains reviewed the results and recommended for the patient to go to the ED. Patient was contacted and she agreed.

## 2020-05-10 NOTE — Telephone Encounter (Signed)
Called patient to ask what symptoms she was currently having and she stated that for the past week she had been having abdominal pain radiating to her back. Patient believes that she has a diverticulitis episode. Patient stated that she went to see Dr. Tilden Dome at [facility] on 04/14/2020 and had a colonoscopy done. Patient stated that he had to use a neonatal scope due to the narrowing of her intestines due to scarring from having diverticulosis. Patient would like some medication sent today since she is not able to eat anything due to the pain. Please advise.

## 2020-05-10 NOTE — ED Triage Notes (Signed)
Pt to ED after having a CT scan earlier today, pt called by GI doctor with results, available in results review. Per CT results, pt with recurrent acute sigmoid colon diverticulitis with small contained perforation. Pt denies any pain at this time, states had horrible pain last week but states that she has not been eating, just drinking broth.

## 2020-05-10 NOTE — Telephone Encounter (Signed)
Patient called back letting me know that she was at the emergency room and had told them that she was sent by Dr. Bonna Gains for diverticulitis and with a small perforation in her sigmoid colon. However, due to the amount of people, she will have to wait hours. I then told her that I would call Dr. Bonna Gains for recommendations. After speaking to Dr. Bonna Gains, unfortunately patient has to wait to be seen at the ED in order for the hospitalist to direct admit her. I then called the patient and explained what the process was and she understood that there was nothing Dr. Bonna Gains could do since she does not have privileges. Patient understood and had no further questions.

## 2020-05-10 NOTE — Telephone Encounter (Signed)
Valarie Merino from [facility] Radiology LM on VM to call for stat report from  CT scan on this patient.

## 2020-05-10 NOTE — H&P (Signed)
History and Physical    ZAHRA PEFFLEY ASN:[id number] DOB: [birth year] DOA: 05/10/2020  PCP: Pcp, No   Patient coming from: Home  I have personally briefly reviewed patient's old medical records in [place]  Chief Complaint: Abnormal CT abdomen  HPI: Shirley Lowe is a 70 y.o. female with medical history significant for ulcerative colitis, followed by GI who was sent in by GI for admission after an outpatient CT revealed acute diverticulitis with a small contained perforation without abscess.  Patient reports that she has been having abdominal pain for the past week, not improving with dietary changes to clear liquid diet.  Pain would worsen with any attempt to advance her diet.  She had no associated nausea, vomiting, fever or chills.  She denies cough or shortness of breath and denies chest pain.  On arrival she has no abdominal pain ED Course: Vitals in the emergency room within normal limits.  Blood work within normal limits.   EKG as reviewed by me : Normal sinus rhythm with no acute ST-T wave changes Imaging: CT abdomen and pelvis showing recurrent acute sigmoid colon diverticulitis with small contained perforation, no drainable abscess identified at this time  The emergency room provider contacted surgeon, Dr. Celine Ahr who requested general medical service admission.  Patient being admitted due to failure of outpatient management.  Review of Systems: As per HPI otherwise all other systems on review of systems negative.    Past Medical History:  Diagnosis Date  . Osteoporosis   . Ulcerative colitis     Past Surgical History:  Procedure Laterality Date  . ABDOMINAL HYSTERECTOMY    . COLONOSCOPY WITH PROPOFOL N/A 11/12/2019   Procedure: COLONOSCOPY WITH PROPOFOL;  Surgeon: Virgel Manifold, MD;  Location: [facility] ENDOSCOPY;  Service: Endoscopy;  Laterality: N/A;  . INCONTINENCE SURGERY    . TONSILLECTOMY       reports that she has never smoked. She has never used  smokeless tobacco. She reports that she does not drink alcohol and does not use drugs.  Allergies  Allergen Reactions  . Penicillins     Has yeast infection    Family History  Problem Relation Age of Onset  . Cancer Mother        breast cancer  . Breast cancer Mother 71       metastic  . Cancer Father        pancreatic  . Hepatitis C Sister   . Thyroid disease Sister   . Irritable bowel syndrome Sister   . Allergies Sister       Prior to Admission medications   Medication Sig Start Date End Date Taking? Authorizing Provider  Ascorbic Acid (VITAMIN C) 1000 MG tablet Take 1,000 mg by mouth daily. Patient not taking: Reported on 12/02/2019    [provider]  b complex vitamins tablet Take 1 tablet by mouth daily. Patient not taking: Reported on 12/02/2019    [provider]  Cholecalciferol (VITAMIN D) 50 MCG (2000 UT) CAPS Take 1 capsule (2,000 Units total) by mouth daily. Patient not taking: Reported on 12/02/2019 08/28/19   Steele Sizer, MD  clobetasol cream (TEMOVATE) 1.93 % Apply 1 application topically at bedtime. 07/17/17   Arnetha Courser, MD  fluconazole (DIFLUCAN) 150 MG tablet Take 1 tablet (150 mg total) by mouth daily. Take one tablet today.  May repeat in 3 days. 12/15/19   Sharion Balloon, NP  mesalamine (LIALDA) 1.2 g EC tablet TAKE 1 TABLET (1.2 G  TOTAL) BY MOUTH IN THE MORNING AND AT BEDTIME. 04/19/20   Tahiliani, Lennette Bihari, MD  Omega-3 Fatty Acids (FISH OIL) 1000 MG CAPS Take 1 capsule by mouth daily.    [provider]  ondansetron (ZOFRAN) 4 MG tablet Take 1 tablet (4 mg total) by mouth daily as needed. 11/27/19 11/26/20  Merlyn Lot, MD  raloxifene (EVISTA) 60 MG tablet Take 1 tablet (60 mg total) by mouth daily. 08/28/19   Steele Sizer, MD  Saccharomyces boulardii (PROBIOTIC) 250 MG CAPS Take 1 capsule by mouth 3 times/day as needed-between meals & bedtime. Patient not taking: Reported on 12/02/2019 11/27/19   Merlyn Lot, MD   vitamin E 100 UNIT capsule Take 100 Units by mouth daily. Patient not taking: Reported on 12/02/2019    [provider]    Physical Exam: Vitals:   05/10/20 1644 05/10/20 2012  BP: 125/63 128/64  Pulse: 65 (!) 59  Resp: 16 18  Temp: 98.2 F (36.8 C) 97.6 F (36.4 C)  TempSrc: Oral Oral  SpO2: 97% 100%  Weight: 76.2 kg   Height: 5' 4.5" (1.638 m)      Vitals:   05/10/20 1644 05/10/20 2012  BP: 125/63 128/64  Pulse: 65 (!) 59  Resp: 16 18  Temp: 98.2 F (36.8 C) 97.6 F (36.4 C)  TempSrc: Oral Oral  SpO2: 97% 100%  Weight: 76.2 kg   Height: 5' 4.5" (1.638 m)       Constitutional: Alert and oriented x 3 . Not in any apparent distress HEENT:      Head: Normocephalic and atraumatic.         Eyes: PERLA, EOMI, Conjunctivae are normal. Sclera is non-icteric.       Mouth/Throat: Mucous membranes are moist.       Neck: Supple with no signs of meningismus. Cardiovascular: Regular rate and rhythm. No murmurs, gallops, or rubs. 2+ symmetrical distal pulses are present . No JVD. No LE edema Respiratory: Respiratory effort normal .Lungs sounds clear bilaterally. N wheezes, crackles, or rhonchi.  Gastrointestinal: Soft, non tender, and non distended with positive bowel sounds.  Genitourinary: No CVA tenderness. Musculoskeletal: Nontender with normal range of motion in all extremities. No cyanosis, or erythema of extremities. Neurologic:  Face is symmetric. Moving all extremities. No gross focal neurologic deficits . Skin: Skin is warm, dry.  No rash or ulcers Psychiatric: Mood and affect are normal    Labs on Admission: I have personally reviewed following labs and imaging studies  CBC: Recent Labs  Lab 05/10/20 1721  WBC 6.5  NEUTROABS 3.7  HGB 14.3  HCT 44.3  MCV 88.2  PLT 280   Basic Metabolic Panel: Recent Labs  Lab 05/10/20 1402 05/10/20 1721  NA  --  139  K  --  3.9  CL  --  99  CO2  --  27  GLUCOSE  --  90  BUN  --  17  CREATININE 0.70 0.53   CALCIUM  --  9.9   GFR: Estimated Creatinine Clearance: 65.2 mL/min (by C-G formula based on SCr of 0.53 mg/dL). Liver Function Tests: Recent Labs  Lab 05/10/20 1721  AST 36  ALT 125*  ALKPHOS 89  BILITOT 0.8  PROT 8.4*  ALBUMIN 4.3   No results for input(s): LIPASE, AMYLASE in the last 168 hours. No results for input(s): AMMONIA in the last 168 hours. Coagulation Profile: No results for input(s): INR, PROTIME in the last 168 hours. Cardiac Enzymes: No results for input(s): CKTOTAL,  CKMB, CKMBINDEX, TROPONINI in the last 168 hours. BNP (last 3 results) No results for input(s): PROBNP in the last 8760 hours. HbA1C: No results for input(s): HGBA1C in the last 72 hours. CBG: No results for input(s): GLUCAP in the last 168 hours. Lipid Profile: No results for input(s): CHOL, HDL, LDLCALC, TRIG, CHOLHDL, LDLDIRECT in the last 72 hours. Thyroid Function Tests: No results for input(s): TSH, T4TOTAL, FREET4, T3FREE, THYROIDAB in the last 72 hours. Anemia Panel: No results for input(s): VITAMINB12, FOLATE, FERRITIN, TIBC, IRON, RETICCTPCT in the last 72 hours. Urine analysis:    Component Value Date/Time   LABSPEC 1.025 05/01/2011 2041   PHURINE 5.5 05/01/2011 2041   GLUCOSEU NEGATIVE 05/01/2011 2041   HGBUR MODERATE (A) 05/01/2011 2041   BILIRUBINUR small (A) 12/15/2019 1706   KETONESUR small (15) (A) 12/15/2019 1706   KETONESUR TRACE (A) 05/01/2011 2041   PROTEINUR =100 (A) 12/15/2019 1706   PROTEINUR 30 (A) 05/01/2011 2041   UROBILINOGEN 2.0 (A) 12/15/2019 1706   UROBILINOGEN 0.2 05/01/2011 2041   NITRITE Positive (A) 12/15/2019 1706   NITRITE NEGATIVE 05/01/2011 2041   LEUKOCYTESUR Large (3+) (A) 12/15/2019 1706    Radiological Exams on Admission: CT ABDOMEN PELVIS W CONTRAST  Result Date: 05/10/2020 CLINICAL DATA:  Abdominal pain radiating into the back for the past week. Diverticulitis suspected. EXAM: CT ABDOMEN AND PELVIS WITH CONTRAST TECHNIQUE: Multidetector  CT imaging of the abdomen and pelvis was performed using the standard protocol following bolus administration of intravenous contrast. CONTRAST:  174m OMNIPAQUE IOHEXOL 300 MG/ML  SOLN COMPARISON:  Prior CTs 11/27/2019. FINDINGS: Lower chest: Images through the lower chest are mildly degraded by breathing artifact. The lung bases appear clear. There is no pleural or pericardial effusion. Hepatobiliary: Hypervascular lesion in the dome of the left hepatic lobe measuring 1.4 x 0.9 cm on image 11/2 is unchanged, probably a flash filling hemangioma or other incidental vascular malformation fed by hepatic arterial branches. There is a stable 8 mm low-density lesion in the right hepatic lobe on image 17/2. No evidence of gallstones, gallbladder wall thickening or biliary dilatation. Pancreas: Unremarkable. No pancreatic ductal dilatation or surrounding inflammatory changes. Spleen: Normal in size without focal abnormality. Adrenals/Urinary Tract: Both adrenal glands appear normal. Small bilateral renal cysts. No evidence of enhancing renal mass, urinary tract calculus or hydronephrosis. The bladder appears normal. Stomach/Bowel: Enteric contrast was administered and has passed into the distal small bowel. The stomach, small bowel and appendix appear normal. There are diverticular changes throughout the colon. These are most advanced in the sigmoid colon where there is associated wall thickening and surrounding inflammatory changes. There is a small extraluminal air fluid collection adjacent to the sigmoid colon, measuring 2.5 x 2.3 cm on image 66/2, consistent with a small contained perforation. No drainable abscess identified at this time. No evidence of bowel obstruction or extravasation of enteric contrast. Vascular/Lymphatic: There are no enlarged abdominal or pelvic lymph nodes. No significant vascular findings. Minimal aortic atherosclerosis. The portal, superior mesenteric and splenic veins are patent.  Reproductive: Hysterectomy. Stable low-density adnexal lesions bilaterally, measuring 2.0 cm on the right (image 66/2) and 1.8 cm on the left (image 63/2). Process on the left is near the sigmoid colon inflammation, although appears separate from it. Other: No ascites or free air. Small umbilical hernia containing only fat. Musculoskeletal: No acute or significant osseous findings. Mild lumbar spondylosis. IMPRESSION: 1. Recurrent acute sigmoid colon diverticulitis with small contained perforation. No drainable abscess identified at this time. No evidence  of bowel obstruction or extravasation of enteric contrast. 2. Stable small low-density adnexal lesions bilaterally from prior CT of 5 months ago, likely cysts. No follow-up imaging recommended. Note: This recommendation does not apply to premenarchal patients and to those with increased risk (genetic, family history, elevated tumor markers or other high-risk factors) of ovarian cancer. Reference: JACR 2020 Feb; 17(2):248-254 3. 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 4. Aortic Atherosclerosis (ICD10-I70.0). 5. These results will be called to the ordering clinician or representative by the Radiologist Assistant, and communication documented in the PACS or [employer]. Electronically Signed   By: Richardean Sale M.D.   On: 05/10/2020 14:29     Assessment/Plan 70 year old female with history of ulcerative colitis, followed by GI who was sent in by GI for admission after an outpatient CT revealed acute diverticulitis with a small contained perforation without abscess.  Being admitted due to failed outpatient management     Acute diverticulitis History of ulcerative colitis - Patient with intermittent abdominal pain for a week not responding to clear liquid diet.  Vitals normal, labs normal -CT abdomen and pelvis showing recurrent acute sigmoid  colon diverticulitis with small contained perforation, no drainable abscess identified at this time - Surgical consult to follow - Keep n.p.o. - IV Rocephin and Flagyl (patient allergic to penicillin) - IV hydration, IV pain meds, IV antiemetics and general supportive care    DVT prophylaxis: Lovenox  Code Status: full code  Family Communication:  none  Disposition Plan: Back to previous home environment Consults called: surgery  Status:At the time of admission, it appears that the appropriate admission status for this patient is INPATIENT. This is judged to be reasonable and necessary in order to provide the required intensity of service to ensure the patient's safety given the presenting symptoms, physical exam findings, and initial radiographic and laboratory data in the context of their  Comorbid conditions.   Patient requires inpatient status due to high intensity of service, high risk for further deterioration and high frequency of surveillance required.   I certify that at the point of admission it is my clinical judgment that the patient will require inpatient hospital care spanning beyond [place] MD [facility]     05/10/2020, 11:50 PM

## 2020-05-10 NOTE — Progress Notes (Signed)
Briefly, this is a 70 year-old woman with a history of ulcerative pan-colitis and multiple episodes of diverticulitis.  She contacted her gastroenterologist, Dr. Bonna Gains earlier today due to 5 days of abdominal pain.  Dr. Bonna Gains ordered a CT scan which showed diverticulitis with a small contained perforation with no abscess.  She was instructed to present to the ED after Dr. Bonna Gains was unable to directly admit her to the hospital (change in hospital policy).  In the ED, her vital signs have been stable and her labs were within normal range, aside from mild ALT and total protein elevation.  Her pain was rated at zero out of 10, per the ED physician.  General surgery was contacted for admission, however, due to the strong likelihood that this episode will resolve without surgical intervention, hospitalist admission is recommended.  We will follow along as consultants.  Full note to follow in the AM.  In the interim, advise that patient be kept NPO except for sips of water with medications; IV fluids; IV antibiotics.  Patient's chart claims a penicillin allergy, however it states that the allergy is "has yeast infection," therefore this is unlikely to be a true allergy and Zosyn could be used.  Alternatively, cipro + flagyl or a 3rd generation cephalosporin could be used, if preferred.

## 2020-05-11 ENCOUNTER — Encounter: Payer: Self-pay | Admitting: Internal Medicine

## 2020-05-11 DIAGNOSIS — Z8719 Personal history of other diseases of the digestive system: Secondary | ICD-10-CM

## 2020-05-11 DIAGNOSIS — E663 Overweight: Secondary | ICD-10-CM | POA: Diagnosis present

## 2020-05-11 DIAGNOSIS — R10814 Left lower quadrant abdominal tenderness: Secondary | ICD-10-CM

## 2020-05-11 DIAGNOSIS — K5720 Diverticulitis of large intestine with perforation and abscess without bleeding: Principal | ICD-10-CM

## 2020-05-11 LAB — CBC
HCT: 39.3 % (ref 36.0–46.0)
Hemoglobin: 12.7 g/dL (ref 12.0–15.0)
MCH: 28.5 pg (ref 26.0–34.0)
MCHC: 32.3 g/dL (ref 30.0–36.0)
MCV: 88.3 fL (ref 80.0–100.0)
Platelets: 223 10*3/uL (ref 150–400)
RBC: 4.45 MIL/uL (ref 3.87–5.11)
RDW: 13.4 % (ref 11.5–15.5)
WBC: 6.9 10*3/uL (ref 4.0–10.5)
nRBC: 0 % (ref 0.0–0.2)

## 2020-05-11 LAB — URINALYSIS, COMPLETE (UACMP) WITH MICROSCOPIC
Bilirubin Urine: NEGATIVE
Glucose, UA: NEGATIVE mg/dL
Hgb urine dipstick: NEGATIVE
Ketones, ur: 20 mg/dL — AB
Nitrite: POSITIVE — AB
Protein, ur: NEGATIVE mg/dL
Specific Gravity, Urine: 1.032 — ABNORMAL HIGH (ref 1.005–1.030)
pH: 5 (ref 5.0–8.0)

## 2020-05-11 LAB — RESP PANEL BY RT-PCR (FLU A&B, COVID) ARPGX2
Influenza A by PCR: NEGATIVE
Influenza B by PCR: NEGATIVE
SARS Coronavirus 2 by RT PCR: NEGATIVE

## 2020-05-11 LAB — CREATININE, SERUM
Creatinine, Ser: 0.63 mg/dL (ref 0.44–1.00)
GFR, Estimated: >60 mL/min (ref >60)

## 2020-05-11 LAB — LACTIC ACID, PLASMA: Lactic Acid, Venous: 0.9 mmol/L (ref 0.5–1.9)

## 2020-05-11 MED ORDER — LOPERAMIDE HCL 2 MG PO CAPS
2.0000 mg | ORAL_CAPSULE | ORAL | Status: DC | PRN
  Administered 2020-05-11 (×2): 2 mg via ORAL
  Filled 2020-05-11 (×2): qty 1

## 2020-05-11 MED ORDER — SODIUM CHLORIDE 0.9 % IV SOLN
1.0000 g | INTRAVENOUS | Status: DC
  Filled 2020-05-11: qty 10

## 2020-05-11 MED ORDER — METRONIDAZOLE IN NACL 5-0.79 MG/ML-% IV SOLN
500.0000 mg | Freq: Three times a day (TID) | INTRAVENOUS | Status: DC
  Administered 2020-05-11 – 2020-05-12 (×5): 500 mg via INTRAVENOUS
  Filled 2020-05-11 (×8): qty 100

## 2020-05-11 MED ORDER — SODIUM CHLORIDE 0.9 % IV SOLN
2.0000 g | INTRAVENOUS | Status: DC
  Administered 2020-05-12: 2 g via INTRAVENOUS
  Filled 2020-05-11: qty 20

## 2020-05-11 NOTE — Consult Note (Addendum)
[facility] SURGICAL CONSULTATION NOTE (initial) - cpt: 16109   HISTORY OF PRESENT ILLNESS (HPI):  70 y.o. female presented to [facility] ED yesterday for evaluation of abdominal pain. Patient reports that she initially felt LLQ abdominal pain last week on Monday. This was sharp in nature and came on suddenly. She has a history of diverticulitis and this felt similar. She reports she has had diverticulitis "multiple times in the past." She transitioned to CLD at home and this improved the pain. Unfortunately, the pain returned on Thursday evening after trying chicken noodle soup. She reports some nausea at home but no fever, chills, cough, CP, SOB, emesis, or bowel changes. She has a history of ulcerative colitis but has been in remission on mesalamine. No previous abdominal surgeries. She presented to her GI physician (Dr Bonna Gains) and underwent CT. CT Abdomen/Pelvis showed acute diverticulitis with contained perforation without abscess. Given this finding, she was referred to the emergency department for admission as direct admission are on hold currently. Work up in the ED was reassuring without leukocytosis.   Surgery is consulted by emergency medicine physician Dr. Rudene Re, MD in this context for evaluation and management of complicated diverticulitis with contained perforation.   PAST MEDICAL HISTORY (PMH):  Past Medical History:  Diagnosis Date  . Osteoporosis   . Ulcerative colitis      PAST SURGICAL HISTORY ([place]):  Past Surgical History:  Procedure Laterality Date  . ABDOMINAL HYSTERECTOMY    . COLONOSCOPY WITH PROPOFOL N/A 11/12/2019   Procedure: COLONOSCOPY WITH PROPOFOL;  Surgeon: Virgel Manifold, MD;  Location: [facility] ENDOSCOPY;  Service: Endoscopy;  Laterality: N/A;  . INCONTINENCE SURGERY    . TONSILLECTOMY       MEDICATIONS:  Prior to Admission medications   Medication Sig Start Date End Date Taking? Authorizing Provider  Ascorbic Acid (VITAMIN C)  1000 MG tablet Take 1,000 mg by mouth daily. Patient not taking: Reported on 12/02/2019    [provider]  b complex vitamins tablet Take 1 tablet by mouth daily. Patient not taking: Reported on 12/02/2019    [provider]  Cholecalciferol (VITAMIN D) 50 MCG (2000 UT) CAPS Take 1 capsule (2,000 Units total) by mouth daily. Patient not taking: Reported on 12/02/2019 08/28/19   Steele Sizer, MD  clobetasol cream (TEMOVATE) 6.04 % Apply 1 application topically at bedtime. 07/17/17   Arnetha Courser, MD  fluconazole (DIFLUCAN) 150 MG tablet Take 1 tablet (150 mg total) by mouth daily. Take one tablet today.  May repeat in 3 days. 12/15/19   Sharion Balloon, NP  mesalamine (LIALDA) 1.2 g EC tablet TAKE 1 TABLET (1.2 G TOTAL) BY MOUTH IN THE MORNING AND AT BEDTIME. 04/19/20   Virgel Manifold, MD  Omega-3 Fatty Acids (FISH OIL) 1000 MG CAPS Take 1 capsule by mouth daily.    [provider]  ondansetron (ZOFRAN) 4 MG tablet Take 1 tablet (4 mg total) by mouth daily as needed. 11/27/19 11/26/20  Merlyn Lot, MD  raloxifene (EVISTA) 60 MG tablet Take 1 tablet (60 mg total) by mouth daily. 08/28/19   Steele Sizer, MD  Saccharomyces boulardii (PROBIOTIC) 250 MG CAPS Take 1 capsule by mouth 3 times/day as needed-between meals & bedtime. Patient not taking: Reported on 12/02/2019 11/27/19   Merlyn Lot, MD  vitamin E 100 UNIT capsule Take 100 Units by mouth daily. Patient not taking: Reported on 12/02/2019    [provider]     ALLERGIES:  Allergies  Allergen  Reactions  . Penicillins     Has yeast infection     SOCIAL HISTORY:  Social History   Socioeconomic History  . Marital status: Married    Spouse name: [name]  . Number of children: 3  . Years of education: Not on file  . Highest education level: Bachelor's degree (e.g., BA, AB, BS)  Occupational History  . Occupation: Retired  Tobacco Use  . Smoking status: Never Smoker  . Smokeless tobacco:  Never Used  . Tobacco comment: smoking cessation materials not required  Vaping Use  . Vaping Use: Never used  Substance and Sexual Activity  . Alcohol use: No  . Drug use: No  . Sexual activity: Not Currently  Other Topics Concern  . Not on file  Social History Narrative  . Not on file   Social Determinants of Health   Financial Resource Strain: Not on file  Food Insecurity: Not on file  Transportation Needs: Not on file  Physical Activity: Not on file  Stress: Not on file  Social Connections: Not on file  Intimate Partner Violence: Not on file     FAMILY HISTORY:  Family History  Problem Relation Age of Onset  . Cancer Mother        breast cancer  . Breast cancer Mother 81       metastic  . Cancer Father        pancreatic  . Hepatitis C Sister   . Thyroid disease Sister   . Irritable bowel syndrome Sister   . Allergies Sister       REVIEW OF SYSTEMS:  Review of Systems  Constitutional: Negative for chills and fever.  HENT: Negative for congestion and sore throat.   Respiratory: Negative for cough and shortness of breath.   Cardiovascular: Negative for chest pain and palpitations.  Gastrointestinal: Positive for abdominal pain and nausea. Negative for blood in stool, constipation, diarrhea, melena and vomiting.  Genitourinary: Negative for dysuria and urgency.  All other systems reviewed and are negative.   VITAL SIGNS:  Temp:  [97.6 F (36.4 C)-98.2 F (36.8 C)] 97.7 F (36.5 C) (01/11 0339) Pulse Rate:  [58-65] 58 (01/11 0339) Resp:  [16-18] 16 (01/11 0339) BP: (114-128)/(46-64) 114/46 (01/11 0339) SpO2:  [97 %-100 %] 98 % (01/11 0339) Weight:  [76.2 kg] 76.2 kg (01/10 1644)     Height: 5' 4.5" (163.8 cm) Weight: 76.2 kg BMI (Calculated): 28.4   INTAKE/OUTPUT:  No intake/output data recorded.  PHYSICAL EXAM:  Physical Exam Vitals and nursing note reviewed. Exam conducted with a chaperone present.  Constitutional:      General: She is not in  acute distress.    Appearance: Normal appearance. She is not ill-appearing.  HENT:     Head: Normocephalic and atraumatic.  Eyes:     General: No scleral icterus.    Conjunctiva/sclera: Conjunctivae normal.  Cardiovascular:     Rate and Rhythm: Normal rate and regular rhythm.  Pulmonary:     Effort: Pulmonary effort is normal. No respiratory distress.  Abdominal:     General: There is no distension.     Palpations: Abdomen is soft.     Tenderness: There is abdominal tenderness (very mild) in the left lower quadrant. There is no guarding or rebound.     Comments: Abdomen is soft, very mild, if any, LLQ abdominal soreness, non-distended, no rebound/guarding, certainly without evidence of peritonitis   Genitourinary:    Comments: Deferred Musculoskeletal:     Right lower leg:  No edema.     Left lower leg: No edema.  Skin:    General: Skin is warm and dry.     Coloration: Skin is not pale.     Findings: No erythema.  Neurological:     General: No focal deficit present.     Mental Status: She is alert and oriented to person, place, and time.  Psychiatric:        Mood and Affect: Mood normal.        Behavior: Behavior normal.      Labs:  CBC Latest Ref Rng & Units 05/11/2020 05/10/2020 11/26/2019  WBC 4.0 - 10.5 K/uL 6.9 6.5 8.8  Hemoglobin 12.0 - 15.0 g/dL 12.7 14.3 14.0  Hematocrit 36.0 - 46.0 % 39.3 44.3 43.3  Platelets 150 - 400 K/uL 223 260 307   CMP Latest Ref Rng & Units 05/11/2020 05/10/2020 05/10/2020  Glucose 70 - 99 mg/dL - 90 -  BUN 8 - 23 mg/dL - 17 -  Creatinine 0.44 - 1.00 mg/dL 0.63 0.53 0.70  Sodium 135 - 145 mmol/L - 139 -  Potassium 3.5 - 5.1 mmol/L - 3.9 -  Chloride 98 - 111 mmol/L - 99 -  CO2 22 - 32 mmol/L - 27 -  Calcium 8.9 - 10.3 mg/dL - 9.9 -  Total Protein 6.5 - 8.1 g/dL - 8.4(H) -  Total Bilirubin 0.3 - 1.2 mg/dL - 0.8 -  Alkaline Phos 38 - 126 U/L - 89 -  AST 15 - 41 U/L - 36 -  ALT 0 - 44 U/L - 125(H) -     Imaging studies:   CT  Abdomen/Pelvis (05/10/2020) personally reviewed showing stranding surrounding the sigmoid colon with small amount of contained extraluminal air without abscess, and radiologist report reviewed:  IMPRESSION: 1. Recurrent acute sigmoid colon diverticulitis with small contained perforation. No drainable abscess identified at this time. No evidence of bowel obstruction or extravasation of enteric contrast. 2. Stable small low-density adnexal lesions bilaterally from prior CT of 5 months ago, likely cysts. No follow-up imaging recommended. Note: This recommendation does not apply to premenarchal patients and to those with increased risk (genetic, family history, elevated tumor markers or other high-risk factors) of ovarian cancer. Reference: JACR 2020 Feb; 17(2):248-254 3. 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 4. Aortic Atherosclerosis (ICD10-I70.0). 5. These results will be called to the ordering clinician or representative by the Radiologist Assistant, and communication documented in the PACS or [employer].   Assessment/Plan: (ICD-10's: K27.20) 70 y.o. female with LLQ abdominal pain found to have complicated diverticulitis with contained perforation without abscess, complicated by pertinent comorbidities including history of UC in remission on Mesalamine.   - Appreciate medicine admission  - Given her lack of abdominal pain, I think CLD would be reasonable today  - Continue IV Abx (Rocephin + Flagyl)   - Continue IVF support  - Monitor abdominal pain; on-going bowel function  - Pain control prn; antiemetics prn  - No emergent surgical intervention; She understands that should she clinically deteriorate or fail conservative management then she may requiring surgical intervention involving temporizing colostomy  - Pending clinical condition, she may need repeat imaging in  72 hours   - Mobilization encouraged  - Further management per primary service; we will of course follow   All of the above findings and recommendations were discussed with the patient (and the patient's daughter via telephone  at patient's request), and all of patient's questions were answered to her expressed satisfaction.  Thank you for the opportunity to participate in this patient's care.   -- Edison Simon, PA-C [facility] 05/11/2020, 7:25 AM [phone] M-F: 7am - 4pm

## 2020-05-11 NOTE — Progress Notes (Signed)
PROGRESS NOTE  AMEN DARGIS ERX:[id number] DOB: [birth year] DOA: 05/10/2020 PCP: Pcp, No  HPI/Recap of past 75 hours: 70 year old female with past medical history of ulcerative colitis and diverticulitis who has been treated for the latter for the past 1 week continued to have abdominal pain when she followed up with her GI physician, he ordered a CT which ended up showing acute diverticulitis with contained perforation without abscess and so patient was referred to the emergency room on the evening of 1/10.  Admitted to the hospitalist service and started on IV antibiotics.  Lab work on admission unremarkable.  Today, patient states she is feeling a little bit better, less pain.  Seen by general surgery who recommended conservative measures.  Patient started on clear liquids.  Assessment/Plan: Principal Problem:   Acute diverticulitis: Continue Rocephin and Flagyl. Active Problems:   History of ulcerative colitis: Stable, mesalamine on hold, likely will start the next day once we are able to restart home medications    Overweight (BMI 25.0-29.9): Meets criteria BMI greater than 25  Code Status: Full code  Family Communication: Updated husband by phone  Disposition Plan: Anticipate discharge in the next 1 to 2 days and as diet advances and infection stabilized further  Consultants:  General surgery  Procedures:  None  Antimicrobials:  IV Rocephin and Flagyl 1/10-present  DVT prophylaxis: Lovenox   Objective: Vitals:   05/11/20 0800 05/11/20 1122  BP: 120/86 119/86  Pulse: (!) 59 60  Resp: 16 16  Temp:  97.7 F (36.5 C)  SpO2: 98% 97%   No intake or output data in the 24 hours ending 05/11/20 1207 Filed Weights   05/10/20 1644  Weight: 76.2 kg   Body mass index is 28.39 kg/m.  Exam:   General: Alert and oriented x3, no acute distress  Cardiovascular: Regular rate and rhythm, S1-S2  Respiratory: Clear to auscultation bilaterally  Abdomen: Soft,  minimal tenderness in the left lower quadrant, nondistended, normoactive bowel sounds  Musculoskeletal: No clubbing or cyanosis or edema  Skin: No skin breaks, tears or lesions  Psychiatry: Appropriate, no evidence of psychosis   Data Reviewed: CBC: Recent Labs  Lab 05/10/20 1721 05/11/20 0116  WBC 6.5 6.9  NEUTROABS 3.7  --   HGB 14.3 12.7  HCT 44.3 39.3  MCV 88.2 88.3  PLT 260 950   Basic Metabolic Panel: Recent Labs  Lab 05/10/20 1402 05/10/20 1721 05/11/20 0116  NA  --  139  --   K  --  3.9  --   CL  --  99  --   CO2  --  27  --   GLUCOSE  --  90  --   BUN  --  17  --   CREATININE 0.70 0.53 0.63  CALCIUM  --  9.9  --    GFR: Estimated Creatinine Clearance: 65.2 mL/min (by C-G formula based on SCr of 0.63 mg/dL). Liver Function Tests: Recent Labs  Lab 05/10/20 1721  AST 36  ALT 125*  ALKPHOS 89  BILITOT 0.8  PROT 8.4*  ALBUMIN 4.3   No results for input(s): LIPASE, AMYLASE in the last 168 hours. No results for input(s): AMMONIA in the last 168 hours. Coagulation Profile: No results for input(s): INR, PROTIME in the last 168 hours. Cardiac Enzymes: No results for input(s): CKTOTAL, CKMB, CKMBINDEX, TROPONINI in the last 168 hours. BNP (last 3 results) No results for input(s): PROBNP in the last 8760 hours. HbA1C: No results for input(s): HGBA1C  in the last 72 hours. CBG: No results for input(s): GLUCAP in the last 168 hours. Lipid Profile: No results for input(s): CHOL, HDL, LDLCALC, TRIG, CHOLHDL, LDLDIRECT in the last 72 hours. Thyroid Function Tests: No results for input(s): TSH, T4TOTAL, FREET4, T3FREE, THYROIDAB in the last 72 hours. Anemia Panel: No results for input(s): VITAMINB12, FOLATE, FERRITIN, TIBC, IRON, RETICCTPCT in the last 72 hours. Urine analysis:    Component Value Date/Time   COLORURINE YELLOW (A) 05/10/2020 2344   APPEARANCEUR HAZY (A) 05/10/2020 2344   LABSPEC 1.032 (H) 05/10/2020 2344   PHURINE 5.0 05/10/2020 2344    GLUCOSEU NEGATIVE 05/10/2020 2344   HGBUR NEGATIVE 05/10/2020 2344   BILIRUBINUR NEGATIVE 05/10/2020 2344   BILIRUBINUR small (A) 12/15/2019 1706   KETONESUR 20 (A) 05/10/2020 2344   PROTEINUR NEGATIVE 05/10/2020 2344   UROBILINOGEN 2.0 (A) 12/15/2019 1706   UROBILINOGEN 0.2 05/01/2011 2041   NITRITE POSITIVE (A) 05/10/2020 2344   LEUKOCYTESUR SMALL (A) 05/10/2020 2344   Sepsis Labs: @LABRCNTIP (procalcitonin:4,lacticidven:4)  ) Recent Results (from the past 240 hour(s))  Resp Panel by RT-PCR (Flu A&B, Covid) Urine, Clean Catch     Status: None   Collection Time: 05/10/20 11:44 PM   Specimen: Urine, Clean Catch; Nasopharyngeal(NP) swabs in vial transport medium  Result Value Ref Range Status   SARS Coronavirus 2 by RT PCR NEGATIVE NEGATIVE Final    Comment: (NOTE) SARS-CoV-2 target nucleic acids are NOT DETECTED.  The SARS-CoV-2 RNA is generally detectable in upper respiratory specimens during the acute phase of infection. The lowest concentration of SARS-CoV-2 viral copies this assay can detect is 138 copies/mL. A negative result does not preclude SARS-Cov-2 infection and should not be used as the sole basis for treatment or other patient management decisions. A negative result may occur with  improper specimen collection/handling, submission of specimen other than nasopharyngeal swab, presence of viral mutation(s) within the areas targeted by this assay, and inadequate number of viral copies(<138 copies/mL). A negative result must be combined with clinical observations, patient history, and epidemiological information. The expected result is Negative.  Fact Sheet for Patients:  EntrepreneurPulse.com.au  Fact Sheet for Healthcare Providers:  IncredibleEmployment.be  This test is no t yet approved or cleared by the [place] FDA and  has been authorized for detection and/or diagnosis of SARS-CoV-2 by FDA under an Emergency Use  Authorization (EUA). This EUA will remain  in effect (meaning this test can be used) for the duration of the COVID-19 declaration under Section 564(b)(1) of the Act, 21 U.S.C.section 360bbb-3(b)(1), unless the authorization is terminated  or revoked sooner.       Influenza A by PCR NEGATIVE NEGATIVE Final   Influenza B by PCR NEGATIVE NEGATIVE Final    Comment: (NOTE) The Xpert Xpress SARS-CoV-2/FLU/RSV plus assay is intended as an aid in the diagnosis of influenza from Nasopharyngeal swab specimens and should not be used as a sole basis for treatment. Nasal washings and aspirates are unacceptable for Xpert Xpress SARS-CoV-2/FLU/RSV testing.  Fact Sheet for Patients: EntrepreneurPulse.com.au  Fact Sheet for Healthcare Providers: IncredibleEmployment.be  This test is not yet approved or cleared by the [place] FDA and has been authorized for detection and/or diagnosis of SARS-CoV-2 by FDA under an Emergency Use Authorization (EUA). This EUA will remain in effect (meaning this test can be used) for the duration of the COVID-19 declaration under Section 564(b)(1) of the Act, 21 U.S.C. section 360bbb-3(b)(1), unless the authorization is terminated or revoked.  Performed at [facility],  [place], [place] [address]       Studies: CT ABDOMEN PELVIS W CONTRAST  Result Date: 05/10/2020 CLINICAL DATA:  Abdominal pain radiating into the back for the past week. Diverticulitis suspected. EXAM: CT ABDOMEN AND PELVIS WITH CONTRAST TECHNIQUE: Multidetector CT imaging of the abdomen and pelvis was performed using the standard protocol following bolus administration of intravenous contrast. CONTRAST:  165m OMNIPAQUE IOHEXOL 300 MG/ML  SOLN COMPARISON:  Prior CTs 11/27/2019. FINDINGS: Lower chest: Images through the lower chest are mildly degraded by breathing artifact. The lung bases appear clear. There is no pleural or  pericardial effusion. Hepatobiliary: Hypervascular lesion in the dome of the left hepatic lobe measuring 1.4 x 0.9 cm on image 11/2 is unchanged, probably a flash filling hemangioma or other incidental vascular malformation fed by hepatic arterial branches. There is a stable 8 mm low-density lesion in the right hepatic lobe on image 17/2. No evidence of gallstones, gallbladder wall thickening or biliary dilatation. Pancreas: Unremarkable. No pancreatic ductal dilatation or surrounding inflammatory changes. Spleen: Normal in size without focal abnormality. Adrenals/Urinary Tract: Both adrenal glands appear normal. Small bilateral renal cysts. No evidence of enhancing renal mass, urinary tract calculus or hydronephrosis. The bladder appears normal. Stomach/Bowel: Enteric contrast was administered and has passed into the distal small bowel. The stomach, small bowel and appendix appear normal. There are diverticular changes throughout the colon. These are most advanced in the sigmoid colon where there is associated wall thickening and surrounding inflammatory changes. There is a small extraluminal air fluid collection adjacent to the sigmoid colon, measuring 2.5 x 2.3 cm on image 66/2, consistent with a small contained perforation. No drainable abscess identified at this time. No evidence of bowel obstruction or extravasation of enteric contrast. Vascular/Lymphatic: There are no enlarged abdominal or pelvic lymph nodes. No significant vascular findings. Minimal aortic atherosclerosis. The portal, superior mesenteric and splenic veins are patent. Reproductive: Hysterectomy. Stable low-density adnexal lesions bilaterally, measuring 2.0 cm on the right (image 66/2) and 1.8 cm on the left (image 63/2). Process on the left is near the sigmoid colon inflammation, although appears separate from it. Other: No ascites or free air. Small umbilical hernia containing only fat. Musculoskeletal: No acute or significant osseous  findings. Mild lumbar spondylosis. IMPRESSION: 1. Recurrent acute sigmoid colon diverticulitis with small contained perforation. No drainable abscess identified at this time. No evidence of bowel obstruction or extravasation of enteric contrast. 2. Stable small low-density adnexal lesions bilaterally from prior CT of 5 months ago, likely cysts. No follow-up imaging recommended. Note: This recommendation does not apply to premenarchal patients and to those with increased risk (genetic, family history, elevated tumor markers or other high-risk factors) of ovarian cancer. Reference: JACR 2020 Feb; 17(2):248-254 3. 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 4. Aortic Atherosclerosis (ICD10-I70.0). 5. These results will be called to the ordering clinician or representative by the Radiologist Assistant, and communication documented in the PACS or [employer] Electronically Signed   By: WRichardean SaleM.D.   On: 05/10/2020 14:29    Scheduled Meds: . enoxaparin (LOVENOX) injection  40 mg Subcutaneous Q24H    Continuous Infusions: . sodium chloride 100 mL/hr at 05/11/20 0804  . [START ON 05/12/2020] cefTRIAXone (ROCEPHIN)  IV    . lactated ringers 125 mL/hr at 05/11/20 0329  . metronidazole Stopped (05/11/20 0909)     LOS: 1 day  Annita Brod, MD [facility]   05/11/2020, 12:07 PM

## 2020-05-11 NOTE — Plan of Care (Signed)
Patient arrived to floor.  -patient alert and oriented x4.

## 2020-05-11 NOTE — ED Notes (Signed)
Pt disconnected from IVF and walked to the restroom.  Pt ambulates well independently.

## 2020-05-12 MED ORDER — METRONIDAZOLE 500 MG PO TABS: 500.0000 mg | ORAL_TABLET | Freq: Three times a day (TID) | ORAL | 0 refills | Status: AC

## 2020-05-12 MED ORDER — SULFAMETHOXAZOLE-TRIMETHOPRIM 800-160 MG PO TABS: 1.0000 | ORAL_TABLET | Freq: Two times a day (BID) | ORAL | 0 refills | Status: AC

## 2020-05-12 MED ORDER — ENSURE ENLIVE PO LIQD: 237.0000 mL | Freq: Two times a day (BID) | ORAL | Status: DC

## 2020-05-12 NOTE — Progress Notes (Signed)
Shirley Lowe  A and O x 4 VSS. Pt tolerating diet well. No complaints of pain or nausea. IV removed intact, prescriptions given. Pt voices understanding of discharge instructions with no further questions. Pt discharged home via wheelchair with husband.   Allergies as of 05/12/2020      Reactions   Penicillins    Has yeast infection      Medication List    TAKE these medications   ascorbic acid 1000 MG tablet Commonly known as: VITAMIN C Take 1 tablet by mouth daily.   Vitamin B-Complex Tabs Take 1 tablet by mouth daily.   b complex vitamins tablet Take 1 tablet by mouth daily.   calcium carbonate 1500 (600 Ca) MG Tabs tablet Commonly known as: OSCAL Take 1 tablet by mouth daily.   Fish Oil 1000 MG Caps Take 1 capsule by mouth daily.   mesalamine 1.2 g EC tablet Commonly known as: LIALDA TAKE 1 TABLET (1.2 G TOTAL) BY MOUTH IN THE MORNING AND AT BEDTIME. What changed: See the new instructions.   metroNIDAZOLE 500 MG tablet Commonly known as: Flagyl Take 1 tablet (500 mg total) by mouth 3 (three) times daily for 5 days.   RA Probiotic Digestive Care Caps Take 1 capsule by mouth daily.   sulfamethoxazole-trimethoprim 800-160 MG tablet Commonly known as: BACTRIM DS Take 1 tablet by mouth 2 (two) times daily for 5 days.   Vitamin D 50 MCG (2000 UT) Caps Take 1 capsule (2,000 Units total) by mouth daily.   vitamin E 45 MG (100 UNITS) capsule Take 100 Units by mouth daily.   zinc gluconate 50 MG tablet Take 1 tablet by mouth daily.       Vitals:   05/12/20 0719 05/12/20 1133  BP: (!) 108/46 99/78  Pulse: 61 67  Resp: 16 16  Temp: 98 F (36.7 C) 97.7 F (36.5 C)  SpO2: 94% 98%    Isaiah Serge

## 2020-05-12 NOTE — Progress Notes (Signed)
Patient eating dinner at this time.  Per MD order patient may discharge if she tolerates regular diet meal for dinner. RN reviews AVS with patient, discharge instrucitons, medications.  Per MD patient notified to make hospital follow up appointment with PCP in 1-2 weeks. Report to oncoming shift to evaluate response to dinner and discharge if no pain/vomiting after dinner.

## 2020-05-12 NOTE — Progress Notes (Signed)
Initial Nutrition Assessment  DOCUMENTATION CODES:   Not applicable  INTERVENTION:  Ensure Enlive po BID, each supplement provides 350 kcal and 20 grams of protein (strawberry or vanilla)    NUTRITION DIAGNOSIS:   Inadequate oral intake related to acute illness (acute diverticulitis) as evidenced by per patient/family report,energy intake < 75% for > 7 days.    GOAL:   Patient will meet greater than or equal to 90% of their needs  MONITOR:   PO intake,Supplement acceptance,Diet advancement,I & O's,Labs,Weight trends  REASON FOR ASSESSMENT:   Malnutrition Screening Tool    ASSESSMENT:  70 year old female admitted with acute diverticulitis. Past medical history significant for ulcerative colitis followed by GI who was sent for admission after outpt CT revealed acute diverticulitis with a small contained perforation without abscess.   Pt has responded well to conservative management, no emergent surgical interventions at this time per surgery. Spoke with pt via phone, she reports feeling good today, denies abdominal pain and tolerating clears. Diet advanced to full liquids this morning, currently waiting on lunch. Pt reports clear liquid diet (broth) at home broth over past 8 days. She recalls usual intake of 2-3 meals daily. Recently trying to eat smaller meals due to bloating and occasionally drinks Ensure which she likes.  Weights have trended down ~7 lbs (4.2%) from reported usual 175 lb over the past couple of weeks which is significant for time frame. Given trends as well as consuming only clear liquids over the last 8 days, suspect degree of acute malnutrition, however unable to identify at this time. Will plan to complete exam at follow-up as able, pt reports possible discharge later this evening if tolerating soft diet. She is agreeable to drinking strawberry or vanilla flavored Ensure to help her meet her needs.     Medications reviewed and include: Rocephin, Flagyl  IVF:  NaCl @ 100 ml/hr  Labs reviewed   NUTRITION - FOCUSED PHYSICAL EXAM: Unable to complete at this time, RD working remotely.  Diet Order:   Diet Order            Diet full liquid Room service appropriate? Yes; Fluid consistency: Thin  Diet effective now                 EDUCATION NEEDS:   Education needs have been addressed  Skin:  Skin Assessment: Reviewed RN Assessment  Last BM:  1/11  Height:   Ht Readings from Last 1 Encounters:  05/10/20 5' 4.5" (1.638 m)    Weight:   Wt Readings from Last 1 Encounters:  05/10/20 76.2 kg   BMI:  Body mass index is 28.39 kg/m.  Estimated Nutritional Needs:   Kcal:  1700-1900  Protein:  84-95  Fluid:  > 1.7 L   Lajuan Lines, RD, LDN Clinical Nutrition After Hours/Weekend Pager # in [place]

## 2020-05-12 NOTE — Progress Notes (Addendum)
[facility] SURGICAL PROGRESS NOTE (cpt [phone])  Hospital Day(s): 2.   Interval History: Patient seen and examined, no acute events or new complaints overnight. Patient reports she continues to feel well this morning. No abdominal pain, nausea, emesis, fever, chills. No new labs this morning. She remains on rocephin and Flagyl. CLD initiate yesterday and has tolerated this well. She did have a few episodes of loose stool yesterday but this seems to resolved. No other complaints.   Review of Systems:  Constitutional: denies fever, chills  HEENT: denies cough or congestion  Respiratory: denies any shortness of breath  Cardiovascular: denies chest pain or palpitations  Gastrointestinal: denies abdominal pain, N/V, or diarrhea/and bowel function as per interval history Genitourinary: denies burning with urination or urinary frequency  Vital signs in last 24 hours: [min-max] current  Temp:  [97.5 F (36.4 C)-98.8 F (37.1 C)] 98.6 F (37 C) (01/12 0520) Pulse Rate:  [56-63] 56 (01/12 0520) Resp:  [15-16] 16 (01/12 0520) BP: (104-120)/(45-86) 104/45 (01/12 0520) SpO2:  [94 %-100 %] 94 % (01/12 0520)     Height: 5' 4.5" (163.8 cm) Weight: 76.2 kg BMI (Calculated): 28.4   Intake/Output last 2 shifts:  01/11 0701 - 01/12 0700 In: 2861.4 [P.O.:1020; I.V.:1534.7; IV Piggyback:306.7] Out: -    Physical Exam:  Constitutional: alert, cooperative and no distress  HENT: normocephalic without obvious abnormality  Eyes: PERRL, EOM's grossly intact and symmetric  Respiratory: breathing non-labored at rest  Cardiovascular: regular rate and sinus rhythm  Gastrointestinal: soft, non-tender, and non-distended, no rebound/guarding Musculoskeletal: No edema or wounds, motor and sensation grossly intact, NT    Labs:  CBC Latest Ref Rng & Units 05/11/2020 05/10/2020 11/26/2019  WBC 4.0 - 10.5 K/uL 6.9 6.5 8.8  Hemoglobin 12.0 - 15.0 g/dL 12.7 14.3 14.0  Hematocrit 36.0 - 46.0 % 39.3  44.3 43.3  Platelets 150 - 400 K/uL 223 260 307   CMP Latest Ref Rng & Units 05/11/2020 05/10/2020 05/10/2020  Glucose 70 - 99 mg/dL - 90 -  BUN 8 - 23 mg/dL - 17 -  Creatinine 0.44 - 1.00 mg/dL 0.63 0.53 0.70  Sodium 135 - 145 mmol/L - 139 -  Potassium 3.5 - 5.1 mmol/L - 3.9 -  Chloride 98 - 111 mmol/L - 99 -  CO2 22 - 32 mmol/L - 27 -  Calcium 8.9 - 10.3 mg/dL - 9.9 -  Total Protein 6.5 - 8.1 g/dL - 8.4(H) -  Total Bilirubin 0.3 - 1.2 mg/dL - 0.8 -  Alkaline Phos 38 - 126 U/L - 89 -  AST 15 - 41 U/L - 36 -  ALT 0 - 44 U/L - 125(H) -    Imaging studies: No new pertinent imaging studies   Assessment/Plan: (ICD-10's: K38.20) 70 y.o. female with resolution in abdominal pain and otherwise doing well admitted with complicated diverticulitis with contained perforation without abscess, complicated by pertinent comorbidities including history of UC in remission on Mesalamine.   - Okay to advance to full liquid diet; if she is doing well tonight and wants to try soft foods, I think that would be reasonable   - Continue IV Abx (Rocephin + Flagyl)              - Continue IVF support; wean as diet advances             - Monitor abdominal pain; on-going bowel function             - Pain control prn;  antiemetics prn             - No emergent surgical intervention; She understands that should she clinically deteriorate or fail conservative management then she may requiring surgical intervention involving temporizing colostomy             - Pending clinical condition, she may need repeat imaging in ~48 hours; I think we will be able to forgo this             - Mobilization encouraged             - Further management per primary service; we will of course follow    - Discharge Planning: Anticipate ready for discharge tomorrow (01/13) pending advancement of diet.   All of the above findings and recommendations were discussed with the patient, and the medical team, and all of patient's questions were  answered to her expressed satisfaction.  -- Edison Simon, PA-C [facility] 05/12/2020, 7:14 AM [phone] M-F: 7am - 4pm

## 2020-05-12 NOTE — Discharge Summary (Signed)
Discharge Summary  Shirley Lowe IBB:[id number] DOB: [birth year]  PCP: Pcp, No  Admit date: 05/10/2020 Discharge date: 05/12/2020  Time spent: 25 minutes  Recommendations for Outpatient Follow-up:  1. New medication: Flagyl 500 mg p.o. 3 times daily x5 days 2. New medication, Bactrim DS p.o. twice daily x5 days 3. Patient will follow-up with her PCP in the next 1 month  Discharge Diagnoses:  Active Hospital Problems   Diagnosis Date Noted  . Acute diverticulitis 05/10/2020  . Overweight (BMI 25.0-29.9) 05/11/2020  . History of ulcerative colitis     Resolved Hospital Problems  No resolved problems to display.    Discharge Condition: Improved, being discharged home  Diet recommendation: Low residue diet  Vitals:   05/12/20 0719 05/12/20 1133  BP: (!) 108/46 99/78  Pulse: 61 67  Resp: 16 16  Temp: 98 F (36.7 C) 97.7 F (36.5 C)  SpO2: 94% 98%    History of present illness:  70 year old female with past medical history of ulcerative colitis and diverticulitis who has been having left lower quadrant abdominal pain for the past 1 week when she followed up with her GI physician, he ordered a CT which ended up showing acute diverticulitis with contained perforation without abscess and so patient was referred to the emergency room on the evening of 1/10.  Admitted to the hospitalist service and started on IV antibiotics.  Lab work on admission unremarkable.   Hospital Course:  Principal Problem:   Acute diverticulitis: Seen by general surgery and no surgical intervention recommended at this time.  Tolerated IV antibiotics.  White count remained normal.  Diet able to be advanced which patient tolerated.  She had not been on oral antibiotics prior to admission.  Advised the patient about low residue high-fiber foods as well as if she had additional flares, to contact her PCP and try a course of oral antibiotics at home should this happen again. Active Problems:   History of  ulcerative colitis plan stable at this time, continue mesalamine upon discharge.    Overweight (BMI 25.0-29.9): Met criteria for greater than 25.   Procedures:  None  Consultations:  General surgery  Discharge Exam: BP 99/78 (BP Location: Right Arm)   Pulse 67   Temp 97.7 F (36.5 C) (Oral)   Resp 16   Ht 5' 4.5" (1.638 m)   Wt 76.2 kg   SpO2 98%   BMI 28.39 kg/m   General: Alert and oriented x3, no acute distress Cardiovascular: Regular rate and rhythm, S1-S2 Respiratory: Clear to auscultation bilaterally  Discharge Instructions You were cared for by a hospitalist during your hospital stay. If you have any questions about your discharge medications or the care you received while you were in the hospital after you are discharged, you can call the unit and asked to speak with the hospitalist on call if the hospitalist that took care of you is not available. Once you are discharged, your primary care physician will handle any further medical issues. Please note that NO REFILLS for any discharge medications will be authorized once you are discharged, as it is imperative that you return to your primary care physician (or establish a relationship with a primary care physician if you do not have one) for your aftercare needs so that they can reassess your need for medications and monitor your lab values.   Allergies as of 05/12/2020      Reactions   Penicillins    Has yeast infection  Medication List    TAKE these medications   ascorbic acid 1000 MG tablet Commonly known as: VITAMIN C Take 1 tablet by mouth daily.   Vitamin B-Complex Tabs Take 1 tablet by mouth daily.   b complex vitamins tablet Take 1 tablet by mouth daily.   calcium carbonate 1500 (600 Ca) MG Tabs tablet Commonly known as: OSCAL Take 1 tablet by mouth daily.   Fish Oil 1000 MG Caps Take 1 capsule by mouth daily.   mesalamine 1.2 g EC tablet Commonly known as: LIALDA TAKE 1 TABLET (1.2 G  TOTAL) BY MOUTH IN THE MORNING AND AT BEDTIME. What changed: See the new instructions.   metroNIDAZOLE 500 MG tablet Commonly known as: Flagyl Take 1 tablet (500 mg total) by mouth 3 (three) times daily for 5 days.   RA Probiotic Digestive Care Caps Take 1 capsule by mouth daily.   sulfamethoxazole-trimethoprim 800-160 MG tablet Commonly known as: BACTRIM DS Take 1 tablet by mouth 2 (two) times daily for 5 days.   Vitamin D 50 MCG (2000 UT) Caps Take 1 capsule (2,000 Units total) by mouth daily.   vitamin E 45 MG (100 UNITS) capsule Take 100 Units by mouth daily.   zinc gluconate 50 MG tablet Take 1 tablet by mouth daily.      Allergies  Allergen Reactions  . Penicillins     Has yeast infection      The results of significant diagnostics from this hospitalization (including imaging, microbiology, ancillary and laboratory) are listed below for reference.    Significant Diagnostic Studies: CT ABDOMEN PELVIS W CONTRAST  Result Date: 05/10/2020 CLINICAL DATA:  Abdominal pain radiating into the back for the past week. Diverticulitis suspected. EXAM: CT ABDOMEN AND PELVIS WITH CONTRAST TECHNIQUE: Multidetector CT imaging of the abdomen and pelvis was performed using the standard protocol following bolus administration of intravenous contrast. CONTRAST:  183m OMNIPAQUE IOHEXOL 300 MG/ML  SOLN COMPARISON:  Prior CTs 11/27/2019. FINDINGS: Lower chest: Images through the lower chest are mildly degraded by breathing artifact. The lung bases appear clear. There is no pleural or pericardial effusion. Hepatobiliary: Hypervascular lesion in the dome of the left hepatic lobe measuring 1.4 x 0.9 cm on image 11/2 is unchanged, probably a flash filling hemangioma or other incidental vascular malformation fed by hepatic arterial branches. There is a stable 8 mm low-density lesion in the right hepatic lobe on image 17/2. No evidence of gallstones, gallbladder wall thickening or biliary dilatation.  Pancreas: Unremarkable. No pancreatic ductal dilatation or surrounding inflammatory changes. Spleen: Normal in size without focal abnormality. Adrenals/Urinary Tract: Both adrenal glands appear normal. Small bilateral renal cysts. No evidence of enhancing renal mass, urinary tract calculus or hydronephrosis. The bladder appears normal. Stomach/Bowel: Enteric contrast was administered and has passed into the distal small bowel. The stomach, small bowel and appendix appear normal. There are diverticular changes throughout the colon. These are most advanced in the sigmoid colon where there is associated wall thickening and surrounding inflammatory changes. There is a small extraluminal air fluid collection adjacent to the sigmoid colon, measuring 2.5 x 2.3 cm on image 66/2, consistent with a small contained perforation. No drainable abscess identified at this time. No evidence of bowel obstruction or extravasation of enteric contrast. Vascular/Lymphatic: There are no enlarged abdominal or pelvic lymph nodes. No significant vascular findings. Minimal aortic atherosclerosis. The portal, superior mesenteric and splenic veins are patent. Reproductive: Hysterectomy. Stable low-density adnexal lesions bilaterally, measuring 2.0 cm on the right (image 66/2) and  1.8 cm on the left (image 63/2). Process on the left is near the sigmoid colon inflammation, although appears separate from it. Other: No ascites or free air. Small umbilical hernia containing only fat. Musculoskeletal: No acute or significant osseous findings. Mild lumbar spondylosis. IMPRESSION: 1. Recurrent acute sigmoid colon diverticulitis with small contained perforation. No drainable abscess identified at this time. No evidence of bowel obstruction or extravasation of enteric contrast. 2. Stable small low-density adnexal lesions bilaterally from prior CT of 5 months ago, likely cysts. No follow-up imaging recommended. Note: This recommendation does not apply to  premenarchal patients and to those with increased risk (genetic, family history, elevated tumor markers or other high-risk factors) of ovarian cancer. Reference: JACR 2020 Feb; 17(2):248-254 3. 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 4. Aortic Atherosclerosis (ICD10-I70.0). 5. These results will be called to the ordering clinician or representative by the Radiologist Assistant, and communication documented in the PACS or [employer]. Electronically Signed   By: Richardean Sale M.D.   On: 05/10/2020 14:29    Microbiology: Recent Results (from the past 240 hour(s))  Resp Panel by RT-PCR (Flu A&B, Covid) Urine, Clean Catch     Status: None   Collection Time: 05/10/20 11:44 PM   Specimen: Urine, Clean Catch; Nasopharyngeal(NP) swabs in vial transport medium  Result Value Ref Range Status   SARS Coronavirus 2 by RT PCR NEGATIVE NEGATIVE Final    Comment: (NOTE) SARS-CoV-2 target nucleic acids are NOT DETECTED.  The SARS-CoV-2 RNA is generally detectable in upper respiratory specimens during the acute phase of infection. The lowest concentration of SARS-CoV-2 viral copies this assay can detect is 138 copies/mL. A negative result does not preclude SARS-Cov-2 infection and should not be used as the sole basis for treatment or other patient management decisions. A negative result may occur with  improper specimen collection/handling, submission of specimen other than nasopharyngeal swab, presence of viral mutation(s) within the areas targeted by this assay, and inadequate number of viral copies(<138 copies/mL). A negative result must be combined with clinical observations, patient history, and epidemiological information. The expected result is Negative.  Fact Sheet for Patients:  EntrepreneurPulse.com.au  Fact Sheet for Healthcare Providers:   IncredibleEmployment.be  This test is no t yet approved or cleared by the [place] FDA and  has been authorized for detection and/or diagnosis of SARS-CoV-2 by FDA under an Emergency Use Authorization (EUA). This EUA will remain  in effect (meaning this test can be used) for the duration of the COVID-19 declaration under Section 564(b)(1) of the Act, 21 U.S.C.section 360bbb-3(b)(1), unless the authorization is terminated  or revoked sooner.       Influenza A by PCR NEGATIVE NEGATIVE Final   Influenza B by PCR NEGATIVE NEGATIVE Final    Comment: (NOTE) The Xpert Xpress SARS-CoV-2/FLU/RSV plus assay is intended as an aid in the diagnosis of influenza from Nasopharyngeal swab specimens and should not be used as a sole basis for treatment. Nasal washings and aspirates are unacceptable for Xpert Xpress SARS-CoV-2/FLU/RSV testing.  Fact Sheet for Patients: EntrepreneurPulse.com.au  Fact Sheet for Healthcare Providers: IncredibleEmployment.be  This test is not yet approved or cleared by the [place] FDA and has been authorized for detection and/or diagnosis of SARS-CoV-2 by FDA under an Emergency Use Authorization (EUA). This EUA will remain in effect (meaning this test can be used) for the duration of the COVID-19 declaration under Section  564(b)(1) of the Act, 21 U.S.C. section 360bbb-3(b)(1), unless the authorization is terminated or revoked.  Performed at [facility], [address] [place], [place] [address]      Labs: Basic Metabolic Panel: Recent Labs  Lab 05/10/20 1402 05/10/20 1721 05/11/20 0116  NA  --  139  --   K  --  3.9  --   CL  --  99  --   CO2  --  27  --   GLUCOSE  --  90  --   BUN  --  17  --   CREATININE 0.70 0.53 0.63  CALCIUM  --  9.9  --    Liver Function Tests: Recent Labs  Lab 05/10/20 1721  AST 36  ALT 125*  ALKPHOS 89  BILITOT 0.8  PROT 8.4*  ALBUMIN 4.3    No results for input(s): LIPASE, AMYLASE in the last 168 hours. No results for input(s): AMMONIA in the last 168 hours. CBC: Recent Labs  Lab 05/10/20 1721 05/11/20 0116  WBC 6.5 6.9  NEUTROABS 3.7  --   HGB 14.3 12.7  HCT 44.3 39.3  MCV 88.2 88.3  PLT 260 223   Cardiac Enzymes: No results for input(s): CKTOTAL, CKMB, CKMBINDEX, TROPONINI in the last 168 hours. BNP: BNP (last 3 results) No results for input(s): BNP in the last 8760 hours.  ProBNP (last 3 results) No results for input(s): PROBNP in the last 8760 hours.  CBG: No results for input(s): GLUCAP in the last 168 hours.     Signed:  Annita Brod, MD [facility] 05/12/2020, 12:47 PM

## 2020-05-12 NOTE — Discharge Instructions (Signed)
Diverticulitis  Diverticulitis is infection or inflammation of small pouches (diverticula) in the colon that form due to a condition called diverticulosis. Diverticula can trap stool (feces) and bacteria, causing infection and inflammation. Diverticulitis may cause severe stomach pain and diarrhea. It may lead to tissue damage in the colon that causes bleeding or blockage. The diverticula may also burst (rupture) and cause infected stool to enter other areas of the abdomen. What are the causes? This condition is caused by stool becoming trapped in the diverticula, which allows bacteria to grow in the diverticula. This leads to inflammation and infection. What increases the risk? You are more likely to develop this condition if you have diverticulosis. The risk increases if you:  Are overweight or obese.  Do not get enough exercise.  Drink alcohol.  Use tobacco products.  Eat a diet that has a lot of red meat such as beef, pork, or lamb.  Eat a diet that does not include enough fiber. High-fiber foods include fruits, vegetables, beans, nuts, and whole grains.  Are over 71 years of age. What are the signs or symptoms? Symptoms of this condition may include:  Pain and tenderness in the abdomen. The pain is normally located on the left side of the abdomen, but it may occur in other areas.  Fever and chills.  Nausea.  Vomiting.  Cramping.  Bloating.  Changes in bowel routines.  Blood in your stool. How is this diagnosed? This condition is diagnosed based on:  Your medical history.  A physical exam.  Tests to make sure there is nothing else causing your condition. These tests may include: ? Blood tests. ? Urine tests. ? CT scan of the abdomen. How is this treated? Most cases of this condition are mild and can be treated at home. Treatment may include:  Taking over-the-counter pain medicines.  Following a clear liquid diet.  Taking antibiotic medicines by  mouth.  Resting. More severe cases may need to be treated at a hospital. Treatment may include:  Not eating or drinking.  Taking prescription pain medicine. Rec High-Fiber Eating Plan Fiber, also called dietary fiber, is a type of carbohydrate. It is found foods such as fruits, vegetables, whole grains, and beans. A high-fiber diet can have many health benefits. Your health care provider may recommend a high-fiber diet to help: Prevent constipation. Fiber can make your bowel movements more regular. Lower your cholesterol. Relieve the following conditions: Inflammation of veins in the anus (hemorrhoids). Inflammation of specific areas of the digestive tract (uncomplicated diverticulosis). A problem of the large intestine, also called the colon, that sometimes causes pain and diarrhea (irritable bowel syndrome, or IBS). Prevent overeating as part of a weight-loss plan. Prevent heart disease, type 2 diabetes, and certain cancers. What are tips for following this plan? Reading food labels Check the nutrition facts label on food products for the amount of dietary fiber. Choose foods that have 5 grams of fiber or more per serving. The goals for recommended daily fiber intake include: Men (age 14 or younger): 34-38 g. Men (over age 71): 28-34 g. Women (age 1 or younger): 25-28 g. Women (over age 49): 22-25 g. Your daily fiber goal is _____________ g.   Shopping Choose whole fruits and vegetables instead of processed forms, such as apple juice or applesauce. Choose a wide variety of high-fiber foods such as avocados, lentils, oats, and kidney beans. Read the nutrition facts label of the foods you choose. Be aware of foods with added fiber. These foods  often have high sugar and sodium amounts per serving. Cooking Use whole-grain flour for baking and cooking. Cook with brown rice instead of white rice. Meal planning Start the day with a breakfast that is high in fiber, such as a cereal  that contains 5 g of fiber or more per serving. Eat breads and cereals that are made with whole-grain flour instead of refined flour or white flour. Eat brown rice, bulgur wheat, or millet instead of white rice. Use beans in place of meat in soups, salads, and pasta dishes. Be sure that half of the grains you eat each day are whole grains. General information You can get the recommended daily intake of dietary fiber by: Eating a variety of fruits, vegetables, grains, nuts, and beans. Taking a fiber supplement if you are not able to take in enough fiber in your diet. It is better to get fiber through food than from a supplement. Gradually increase how much fiber you consume. If you increase your intake of dietary fiber too quickly, you may have bloating, cramping, or gas. Drink plenty of water to help you digest fiber. Choose high-fiber snacks, such as berries, raw vegetables, nuts, and popcorn. What foods should I eat? Fruits Berries. Pears. Apples. Oranges. Avocado. Prunes and raisins. Dried figs. Vegetables Sweet potatoes. Spinach. Kale. Artichokes. Cabbage. Broccoli. Cauliflower. Green peas. Carrots. Squash. Grains Whole-grain breads. Multigrain cereal. Oats and oatmeal. Brown rice. Barley. Bulgur wheat. Conrad. Quinoa. Bran muffins. Popcorn. Rye wafer crackers. Meats and other proteins Navy beans, kidney beans, and pinto beans. Soybeans. Split peas. Lentils. Nuts and seeds. Dairy Fiber-fortified yogurt. Beverages Fiber-fortified soy milk. Fiber-fortified orange juice. Other foods Fiber bars. The items listed above may not be a complete list of recommended foods and beverages. Contact a dietitian for more information. What foods should I avoid? Fruits Fruit juice. Cooked, strained fruit. Vegetables Fried potatoes. Canned vegetables. Well-cooked vegetables. Grains White bread. Pasta made with refined flour. White rice. Meats and other proteins Fatty cuts of meat. Fried chicken  or fried fish. Dairy Milk. Yogurt. Cream cheese. Sour cream. Fats and oils Butters. Beverages Soft drinks. Other foods Cakes and pastries. The items listed above may not be a complete list of foods and beverages to avoid. Talk with your dietitian about what choices are best for you. Summary Fiber is a type of carbohydrate. It is found in foods such as fruits, vegetables, whole grains, and beans. A high-fiber diet has many benefits. It can help to prevent constipation, lower blood cholesterol, aid weight loss, and reduce your risk of heart disease, diabetes, and certain cancers. Increase your intake of fiber gradually. Increasing fiber too quickly may cause cramping, bloating, and gas. Drink plenty of water while you increase the amount of fiber you consume. The best sources of fiber include whole fruits and vegetables, whole grains, nuts, seeds, and beans. This information is not intended to replace advice given to you by your health care provider. Make sure you discuss any questions you have with your health care provider. Document Revised: 08/21/2019 Document Reviewed: 08/21/2019 Elsevier Patient Education  2021 [employer] antibiotic medicines through an IV.  Receiving fluids and nutrition through an IV.  Surgery. When your condition is under control, your health care provider may recommend that you have a colonoscopy. This is an exam to look at the entire large intestine. During the exam, a lubricated, bendable tube is inserted into the anus and then passed into the rectum, colon, and other parts of the large  intestine. A colonoscopy can show how severe your diverticula are and whether something else may be causing your symptoms. Follow these instructions at home: Medicines  Take over-the-counter and prescription medicines only as told by your health care provider. These include fiber supplements, probiotics, and stool softeners.  If you were prescribed an antibiotic  medicine, take it as told by your health care provider. Do not stop taking the antibiotic even if you start to feel better.  Ask your health care provider if the medicine prescribed to you requires you to avoid driving or using machinery. Eating and drinking  Follow a full liquid diet or another diet as directed by your health care provider.  After your symptoms improve, your health care provider may tell you to change your diet. He or she may recommend that you eat a diet that contains at least 25 grams (25 g) of fiber daily. Fiber makes it easier to pass stool. Healthy sources of fiber include: ? Berries. One cup contains 4-8 grams of fiber. ? Beans or lentils. One-half cup contains 5-8 grams of fiber. ? Green vegetables. One cup contains 4 grams of fiber.  Avoid eating red meat.   General instructions  Do not use any products that contain nicotine or tobacco, such as cigarettes, e-cigarettes, and chewing tobacco. If you need help quitting, ask your health care provider.  Exercise for at least 30 minutes, 3 times each week. You should exercise hard enough to raise your heart rate and break a sweat.  Keep all follow-up visits as told by your health care provider. This is important. You may need to have a colonoscopy. Contact a health care provider if:  Your pain does not improve.  Your bowel movements do not return to normal. Get help right away if:  Your pain gets worse.  Your symptoms do not get better with treatment.  Your symptoms suddenly get worse.  You have a fever.  You vomit more than one time.  You have stools that are bloody, black, or tarry. Summary  Diverticulitis is infection or inflammation of small pouches (diverticula) in the colon that form due to a condition called diverticulosis. Diverticula can trap stool (feces) and bacteria, causing infection and inflammation.  You are at higher risk for this condition if you have diverticulosis and you eat a diet  that does not include enough fiber.  Most cases of this condition are mild and can be treated at home. More severe cases may need to be treated at a hospital.  When your condition is under control, your health care provider may recommend that you have an exam called a colonoscopy. This exam can show how severe your diverticula are and whether something else may be causing your symptoms.  Keep all follow-up visits as told by your health care provider. This is important. This information is not intended to replace advice given to you by your health care provider. Make sure you discuss any questions you have with your health care provider. Document Revised: 01/27/2019 Document Reviewed: 01/27/2019 Elsevier Patient Education  2021 [employer].

## 2020-05-27 ENCOUNTER — Other Ambulatory Visit: Payer: Self-pay

## 2020-05-27 ENCOUNTER — Encounter: Payer: Self-pay | Admitting: Physician Assistant

## 2020-05-27 ENCOUNTER — Ambulatory Visit (INDEPENDENT_AMBULATORY_CARE_PROVIDER_SITE_OTHER): Payer: Medicare HMO | Admitting: Physician Assistant

## 2020-05-27 VITALS — BP 121/60 | HR 62 | Temp 98.5°F | Ht 64.5 in | Wt 169.0 lb

## 2020-05-27 DIAGNOSIS — K5792 Diverticulitis of intestine, part unspecified, without perforation or abscess without bleeding: Secondary | ICD-10-CM

## 2020-05-27 NOTE — Progress Notes (Signed)
[facility] NOTE  05/27/2020  History of Present Illness: Shirley Lowe is a 70 y.o. 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 She is known to our service secondary to an admission from 01/10 - 40/98 for complicated diverticulitis with contained perforation which was managed conservatively. She did very well and was discharged home on Bactrim and Flagyl.   She has done very well since discharged. She had some metallic taste with the antibiotics but this resolved. No longer with abdominal pain, distension, nausea, emesis, fever, chills, or bowel changes. She has been following dietary recommendations at home closely. No other complaints currently. We did discuss the role for consideration of elective sigmoidectomy following these episodes of complicated diverticulitis. She reports that "she has been thinking a lot about this and has some hesitations." She reports a history of two major episodes in her life, once in July 2021 and again in January 2022. She has had "samller attacks" around these two episodes but they typically resolved with Abx and bowel rest at home.    Past Medical History: Past Medical History:  Diagnosis Date  . Osteoporosis   . Ulcerative colitis      Past Surgical History: Past Surgical History:  Procedure Laterality Date  . ABDOMINAL HYSTERECTOMY    . COLONOSCOPY WITH PROPOFOL N/A 11/12/2019   Procedure: COLONOSCOPY WITH PROPOFOL;  Surgeon: Virgel Manifold, MD;  Location: [facility] ENDOSCOPY;  Service: Endoscopy;  Laterality: N/A;  . INCONTINENCE SURGERY    . TONSILLECTOMY      Home Medications: Prior to Admission medications   Medication Sig Start Date End Date Taking? Authorizing Provider  ascorbic acid (VITAMIN C) 1000 MG tablet Take 1 tablet by mouth daily.    [provider]  B Complex Vitamins (VITAMIN B-COMPLEX) TABS Take 1 tablet by mouth  daily.    [provider]  b complex vitamins tablet Take 1 tablet by mouth daily.    [provider]  calcium carbonate (OSCAL) 1500 (600 Ca) MG TABS tablet Take 1 tablet by mouth daily.    [provider]  Cholecalciferol (VITAMIN D) 50 MCG (2000 UT) CAPS Take 1 capsule (2,000 Units total) by mouth daily. 08/28/19   Steele Sizer, MD  Lactobacillus Rhamnosus, GG, (RA PROBIOTIC DIGESTIVE CARE) CAPS Take 1 capsule by mouth daily.    [provider]  mesalamine (LIALDA) 1.2 g EC tablet TAKE 1 TABLET (1.2 G TOTAL) BY MOUTH IN THE MORNING AND AT BEDTIME. Patient taking differently: Take 2.4 g by mouth daily with breakfast. Takes both together 04/19/20   Virgel Manifold, MD  Omega-3 Fatty Acids (FISH OIL) 1000 MG CAPS Take 1 capsule by mouth daily.    [provider]  vitamin E 100 UNIT capsule Take 100 Units by mouth daily.    [provider]  zinc gluconate 50 MG tablet Take 1 tablet by mouth daily.    [provider]    Allergies: Allergies  Allergen Reactions  . Penicillins     Has yeast infection    Review of Systems: Review of Systems  Constitutional: Negative for chills and fever.  HENT: Negative for congestion and sore throat.   Respiratory: Negative for cough and shortness of breath.   Cardiovascular: Negative for chest pain and palpitations.  Gastrointestinal: Negative for abdominal pain, constipation, diarrhea, nausea and vomiting.  Genitourinary: Negative for dysuria and urgency.  All other systems  reviewed and are negative.   Physical Exam BP 121/60   Pulse 62   Temp 98.5 F (36.9 C) (Oral)   Ht 5' 4.5" (1.638 m)   Wt 169 lb (76.7 kg)   SpO2 96%   BMI 28.56 kg/m   Physical Exam Constitutional:      General: She is not in acute distress.    Appearance: Normal appearance. She is normal weight. She is not ill-appearing.  HENT:     Head: Normocephalic and atraumatic.  Eyes:     General: No  scleral icterus.    Conjunctiva/sclera: Conjunctivae normal.  Cardiovascular:     Rate and Rhythm: Normal rate and regular rhythm.     Pulses: Normal pulses.     Heart sounds: No murmur heard.   Pulmonary:     Effort: Pulmonary effort is normal. No respiratory distress.     Breath sounds: Normal breath sounds.  Abdominal:     General: Abdomen is flat. There is no distension.     Palpations: There is no mass.     Tenderness: There is no abdominal tenderness. There is no guarding or rebound.     Hernia: No hernia is present.  Genitourinary:    Comments: Deferred Musculoskeletal:     Right lower leg: No edema.     Left lower leg: No edema.  Skin:    General: Skin is warm and dry.     Coloration: Skin is not pale.     Findings: No erythema.  Neurological:     General: No focal deficit present.     Mental Status: She is alert and oriented to person, place, and time.  Psychiatric:        Mood and Affect: Mood normal.        Behavior: Behavior normal.     Labs/Imaging: No new pertinent labs or imgaing  Assessment and Plan: This is a 70 y.o. female with episode of complicated diverticulitis managed well with conservative measures.    - I reviewed role for elective partial colectomy following cases of complicated diverticulitis managed conservatively to prevent recurrence. She is very hesitant because she is afraid "they may have to take more colon and ill end up with a bag." I tried to provide reassurance that in elective cases there is a much lower risk of requiring colostomy. However, I do think that her history of Ulcerative Colitis is a complicating factor in this case, and that any definitive interventions may be best suited at a tertiary center with more specialized colorectal surgeons. At this time, she would like time to think about this decision and discuss with her other doctors. I do not think she warrants any further antibiotics at this time. She understands to transition  to high fiber diet at the 6 week mark and avoid constipation. I again reviewed the signs and symptoms of recurrence that should prompt return. All questions and concerns addressed.   Face-to-face time spent with the patient and care providers was 20 minutes, with more than 50% of the time spent counseling, educating, and coordinating care of the patient.     Edison Simon, PA-C [facility] 05/27/2020, 10:45 AM [phone] M-F: 7am - 4pm

## 2020-05-27 NOTE — Patient Instructions (Addendum)
Please call our office if you decide to move forward with surgery. We will be happy to send a referral to a Colorectal surgeon. Please call if you have questions or concerns.    Diverticulitis  Diverticulitis is when small pouches in your colon (large intestine) get infected or swollen. This causes pain in the belly (abdomen) and watery poop (diarrhea). These pouches are called diverticula. The pouches form in people who have a condition called diverticulosis. What are the causes? This condition may be caused by poop (stool) that gets trapped in the pouches in your colon. The poop lets germs (bacteria) grow in the pouches. This causes the infection. What increases the risk? You are more likely to get this condition if you have small pouches in your colon. The risk is higher if:  You are overweight or very overweight (obese).  You do not exercise enough.  You drink alcohol.  You smoke or use products with tobacco in them.  You eat a diet that has a lot of red meat such as beef, pork, or lamb.  You eat a diet that does not have enough fiber in it.  You are older than 71 years of age. What are the signs or symptoms?  Pain in the belly. Pain is often on the left side, but it may be in other areas.  Fever and feeling cold.  Feeling like you may vomit.  Vomiting.  Having cramps.  Feeling full.  Changes to how often you poop.  Blood in your poop. How is this treated? Most cases are treated at home by:  Taking over-the-counter pain medicines.  Following a clear liquid diet.  Taking antibiotic medicines.  Resting. Very bad cases may need to be treated at a hospital. This may include:  Not eating or drinking.  Taking prescription pain medicine.  Getting antibiotic medicines through an IV tube.  Getting fluid and food through an IV tube.  Having surgery. When you are feeling better, your doctor may tell you to have a test to check your colon (colonoscopy). Follow  these instructions at home: Medicines  Take over-the-counter and prescription medicines only as told by your doctor. These include: ? Antibiotics. ? Pain medicines. ? Fiber pills. ? Probiotics. ? Stool softeners.  If you were prescribed an antibiotic medicine, take it as told by your doctor. Do not stop taking the antibiotic even if you start to feel better.  Ask your doctor if the medicine prescribed to you requires you to avoid driving or using machinery. Eating and drinking  Follow a diet as told by your doctor.  When you feel better, your doctor may tell you to change your diet. You may need to eat a lot of fiber. Fiber makes it easier to poop (have a bowel movement). Foods with fiber include: ? Berries. ? Beans. ? Lentils. ? Green vegetables.  Avoid eating red meat.   General instructions  Do not use any products that contain nicotine or tobacco, such as cigarettes, e-cigarettes, and chewing tobacco. If you need help quitting, ask your doctor.  Exercise 3 or more times a week. Try to get 30 minutes each time. Exercise enough to sweat and make your heart beat faster.  Keep all follow-up visits as told by your doctor. This is important. Contact a doctor if:  Your pain does not get better.  You are not pooping like normal. Get help right away if:  Your pain gets worse.  Your symptoms do not get better.  Your  symptoms get worse very fast.  You have a fever.  You vomit more than one time.  You have poop that is: ? Bloody. ? Black. ? Tarry. Summary  This condition happens when small pouches in your colon get infected or swollen.  Take medicines only as told by your doctor.  Follow a diet as told by your doctor.  Keep all follow-up visits as told by your doctor. This is important. This information is not intended to replace advice given to you by your health care provider. Make sure you discuss any questions you have with your health care provider. Document  Revised: 01/27/2019 Document Reviewed: 01/27/2019 Elsevier Patient Education  2021 [employer].

## 2020-06-11 ENCOUNTER — Telehealth: Payer: Self-pay | Admitting: Gastroenterology

## 2020-06-11 NOTE — Telephone Encounter (Signed)
Patient called and reports having a Diverticulitis attack.  She is asking to be referred to a Psychologist, sport and exercise in [place].  Please call to advise.

## 2020-06-11 NOTE — Telephone Encounter (Signed)
error 

## 2020-06-15 ENCOUNTER — Telehealth: Payer: Self-pay | Admitting: Gastroenterology

## 2020-06-15 NOTE — Telephone Encounter (Signed)
Please call patient ASAP. Second call and is not happy as she lvm. Regarding surgery.

## 2020-06-16 ENCOUNTER — Ambulatory Visit: Admission: RE | Admit: 2020-06-16 | Payer: Medicare HMO | Source: Ambulatory Visit

## 2020-06-16 ENCOUNTER — Other Ambulatory Visit: Payer: Self-pay

## 2020-06-16 ENCOUNTER — Encounter: Payer: Self-pay | Admitting: Gastroenterology

## 2020-06-16 ENCOUNTER — Ambulatory Visit: Payer: Medicare HMO | Admitting: Gastroenterology

## 2020-06-16 VITALS — BP 122/75 | HR 80 | Temp 98.1°F | Wt 167.0 lb

## 2020-06-16 DIAGNOSIS — R748 Abnormal levels of other serum enzymes: Secondary | ICD-10-CM

## 2020-06-16 DIAGNOSIS — K5732 Diverticulitis of large intestine without perforation or abscess without bleeding: Secondary | ICD-10-CM | POA: Diagnosis not present

## 2020-06-16 DIAGNOSIS — R1030 Lower abdominal pain, unspecified: Secondary | ICD-10-CM

## 2020-06-16 NOTE — Telephone Encounter (Signed)
Patient called on 06/11/2020 and verbalized Patient called and reports having a Diverticulitis attack.  She is asking to be referred to a Psychologist, sport and exercise in [place].  Please call to advise.

## 2020-06-16 NOTE — Progress Notes (Signed)
Shirley Antigua, MD [address]  [place]  [place], [place] [address]  Main: [phone]  Fax: [phone]   Primary Care Physician: Pcp, No   Chief Complaint  Patient presents with  . Diverticulitis    HPI: Shirley Lowe is a 70 y.o. female with history of diverticulitis here for follow-up.  Patient reports a week ago she started having abdominal pain again associated with an episode of nausea vomiting.  This occurred for 1 night and self resolved.  She states symptoms resolved after 1 night and she is due for did not call any providers.  However, she states symptoms felt similar to her previous diverticulitis attack.  Patient would like referral to surgery given that surgery referral has been discussed with her before given her episodes of diverticulitis  At this time and for the last 4 to 5 days, patient has not had any abdominal pain, fever chills, diarrhea or constipation.  Current Outpatient Medications  Medication Sig Dispense Refill  . ascorbic acid (VITAMIN C) 1000 MG tablet Take 1 tablet by mouth daily.    . B Complex Vitamins (VITAMIN B-COMPLEX) TABS Take 1 tablet by mouth daily.    Marland Kitchen b complex vitamins tablet Take 1 tablet by mouth daily.    . calcium carbonate (OSCAL) 1500 (600 Ca) MG TABS tablet Take 1 tablet by mouth daily.    . Cholecalciferol (VITAMIN D) 50 MCG (2000 UT) CAPS Take 1 capsule (2,000 Units total) by mouth daily. 30 capsule 0  . Lactobacillus Rhamnosus, GG, (RA PROBIOTIC DIGESTIVE CARE) CAPS Take 1 capsule by mouth daily.    . mesalamine (LIALDA) 1.2 g EC tablet TAKE 1 TABLET (1.2 G TOTAL) BY MOUTH IN THE MORNING AND AT BEDTIME. (Patient taking differently: Take 2.4 g by mouth daily with breakfast. Takes both together) 180 tablet 1  . Omega-3 Fatty Acids (FISH OIL) 1000 MG CAPS Take 1 capsule by mouth daily.    . vitamin E 100 UNIT capsule Take 100 Units by mouth daily.    Marland Kitchen zinc gluconate 50 MG tablet Take 1 tablet by mouth daily.      No current facility-administered medications for this visit.    Allergies as of 06/16/2020 - Review Complete 06/16/2020  Allergen Reaction Noted  . Penicillins  05/01/2011    ROS:  General: Negative for anorexia, weight loss, fever, chills, fatigue, weakness. ENT: Negative for hoarseness, difficulty swallowing , nasal congestion. CV: Negative for chest pain, angina, palpitations, dyspnea on exertion, peripheral edema.  Respiratory: Negative for dyspnea at rest, dyspnea on exertion, cough, sputum, wheezing.  GI: See history of present illness. GU:  Negative for dysuria, hematuria, urinary incontinence, urinary frequency, nocturnal urination.  Endo: Negative for unusual weight change.    Physical Examination: Vitals:   06/16/20 1627  BP: 122/75  Pulse: 80  Temp: 98.1 F (36.7 C)  TempSrc: Oral  Weight: 167 lb (75.8 kg)   3 General: Well-nourished, well-developed in no acute distress.  Eyes: No icterus. Conjunctivae pink. Mouth: Oropharyngeal mucosa moist and pink , no lesions erythema or exudate. Neck: Supple, Trachea midline Abdomen: Bowel sounds are normal, nontender, nondistended, no hepatosplenomegaly or masses, no abdominal bruits or hernia , no rebound or guarding.   Extremities: No lower extremity edema. No clubbing or deformities. Neuro: Alert and oriented x 3.  Grossly intact. Skin: Warm and dry, no jaundice.   Psych: Alert and cooperative, normal mood and affect.   Labs: CMP     Component Value  Date/Time   NA 139 05/10/2020 1721   NA 141 03/07/2019 1508   K 3.9 05/10/2020 1721   CL 99 05/10/2020 1721   CO2 27 05/10/2020 1721   GLUCOSE 90 05/10/2020 1721   BUN 17 05/10/2020 1721   BUN 19 03/07/2019 1508   CREATININE 0.63 05/11/2020 0116   CALCIUM 9.9 05/10/2020 1721   PROT 8.4 (H) 05/10/2020 1721   PROT 7.4 03/07/2019 1508   ALBUMIN 4.3 05/10/2020 1721   ALBUMIN 4.4 03/07/2019 1508   AST 36 05/10/2020 1721   ALT 125 (H) 05/10/2020 1721   ALKPHOS  89 05/10/2020 1721   BILITOT 0.8 05/10/2020 1721   BILITOT 0.5 03/07/2019 1508   GFRNONAA >60 05/11/2020 0116   GFRAA >60 11/26/2019 2322   Lab Results  Component Value Date   WBC 6.9 05/11/2020   HGB 12.7 05/11/2020   HCT 39.3 05/11/2020   MCV 88.3 05/11/2020   PLT 223 05/11/2020    Imaging Studies: No results found.  Assessment and Plan:   JIN SHOCKLEY is a 70 y.o. y/o female here for follow-up of diverticulitis  Patient has had complicated diverticulitis before We will obtain repeat CT scan at this time due to symptoms that occurred a week ago  Will refer to [place] surgery as patient specifically is requesting Dr. Leighton Ruff  Abdomen is soft, nontender at this time and no indication for hospitalization at this time a CT shows new findings today  Repeat liver enzymes on next visit as ALT was mildly elevated during her hospitalization for diverticulitis on last visit.  Patient otherwise asymptomatic from the standpoint  Dr Shirley Lowe

## 2020-06-17 ENCOUNTER — Ambulatory Visit: Payer: Medicare HMO

## 2020-06-17 ENCOUNTER — Telehealth: Payer: Self-pay

## 2020-06-17 NOTE — Telephone Encounter (Signed)
Patient states she saw Dr. Bonna Gains yesterday

## 2020-06-17 NOTE — Telephone Encounter (Signed)
Error

## 2020-06-18 ENCOUNTER — Ambulatory Visit
Admission: RE | Admit: 2020-06-18 | Discharge: 2020-06-18 | Disposition: A | Discharge status: Home / Self Care | Payer: Medicare HMO | Source: Ambulatory Visit | Attending: Gastroenterology | Admitting: Gastroenterology

## 2020-06-18 ENCOUNTER — Other Ambulatory Visit: Payer: Self-pay

## 2020-06-18 DIAGNOSIS — R1030 Lower abdominal pain, unspecified: Secondary | ICD-10-CM | POA: Insufficient documentation

## 2020-06-18 LAB — POCT I-STAT CREATININE: Creatinine, Ser: 0.6 mg/dL (ref 0.44–1.00)

## 2020-06-18 IMAGING — CT CT ABD-PELV W/ CM
2 of 5 series · 16 of 46 positions shown, 18 images · IV contrast (APPLIED)
Comparison: 05/10/2020

CLINICAL DATA: Follow-up diverticulitis

EXAM:
CT ABDOMEN AND PELVIS WITH CONTRAST
TECHNIQUE: Multidetector CT imaging of the abdomen and pelvis was performed
using the standard protocol following bolus administration of
intravenous contrast.
CONTRAST:  100mL OMNIPAQUE IOHEXOL 300 MG/ML  SOLN

[Series 2: routine abd/pel with · axial · 0.96mm/px · z∈[-460,-65]mm · 13 of 89 slices shown, 15 images]
[im 5/89  soft-tissue]
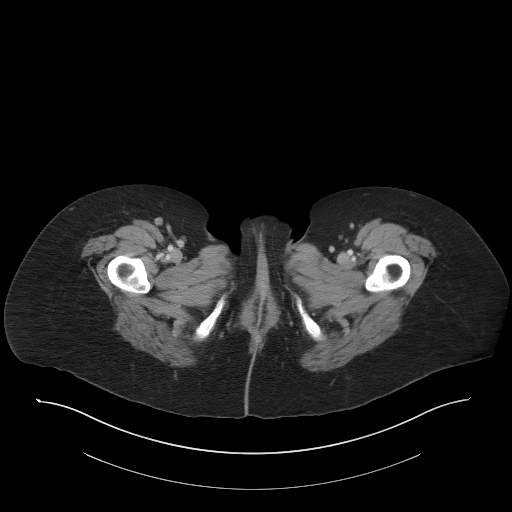
[im 5/89  bone]
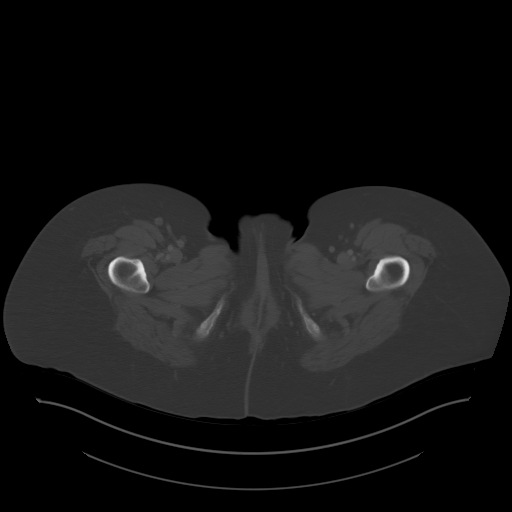
[im 10/89  soft-tissue]
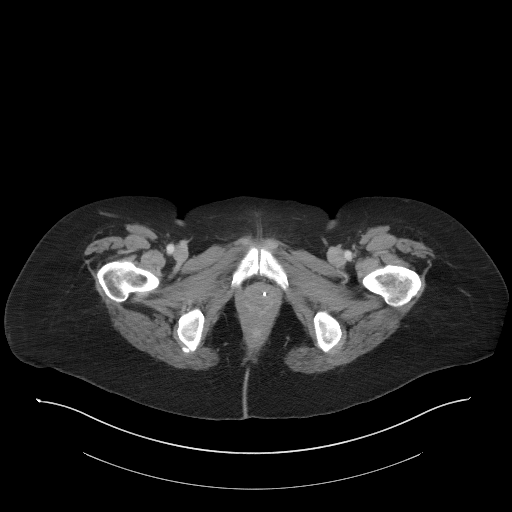
[im 20/89  soft-tissue]
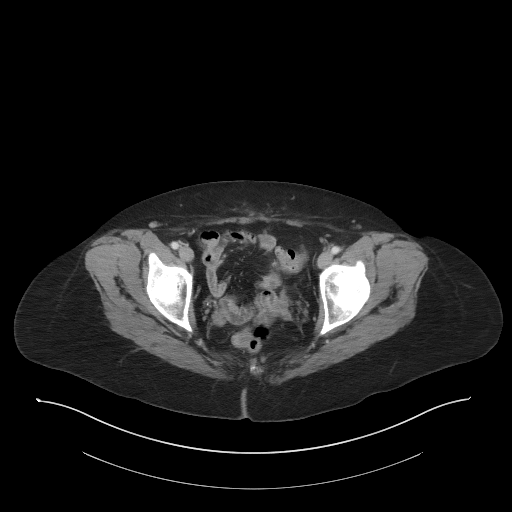
[im 25/89  soft-tissue]
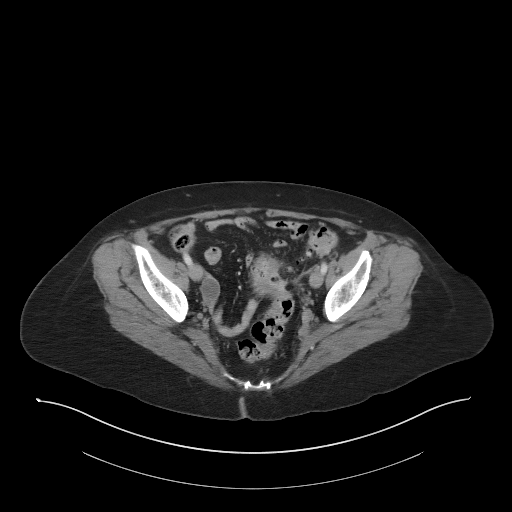
[im 30/89  soft-tissue]
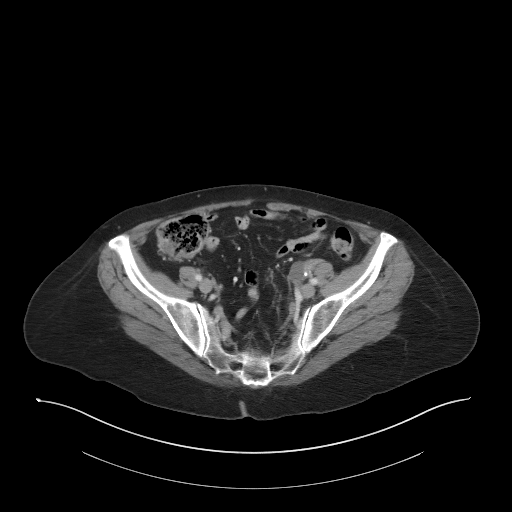
[im 40/89  soft-tissue]
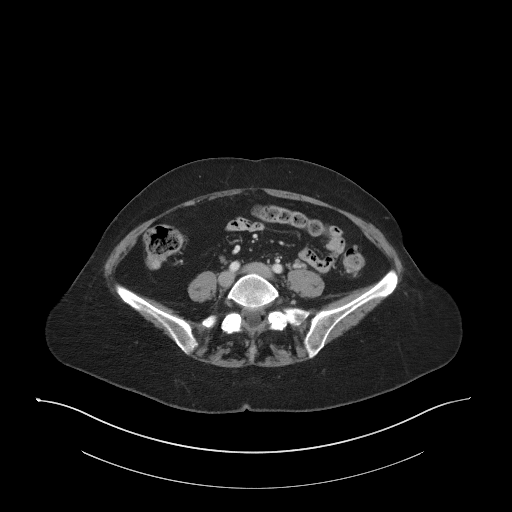
[im 45/89  soft-tissue]
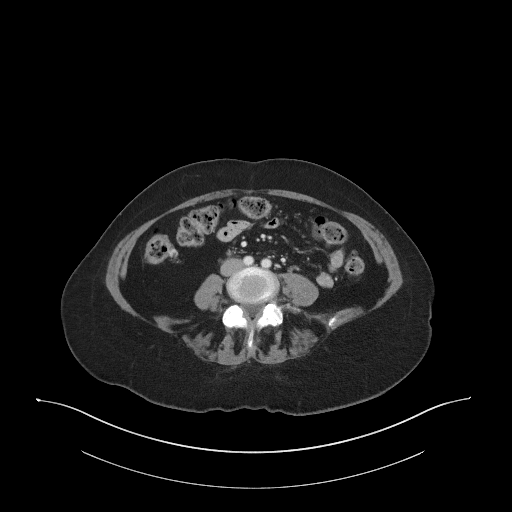
[im 49/89  soft-tissue]
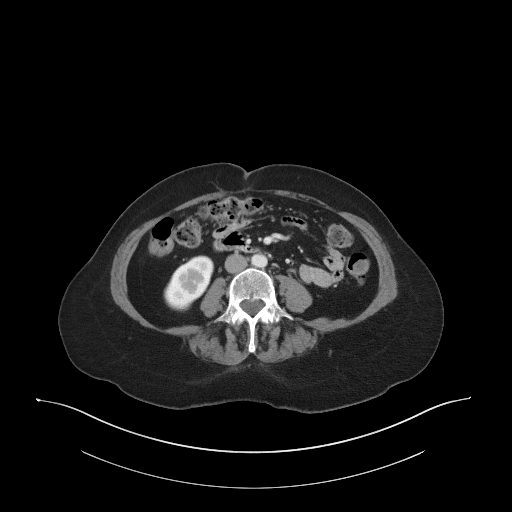
[im 59/89  soft-tissue]
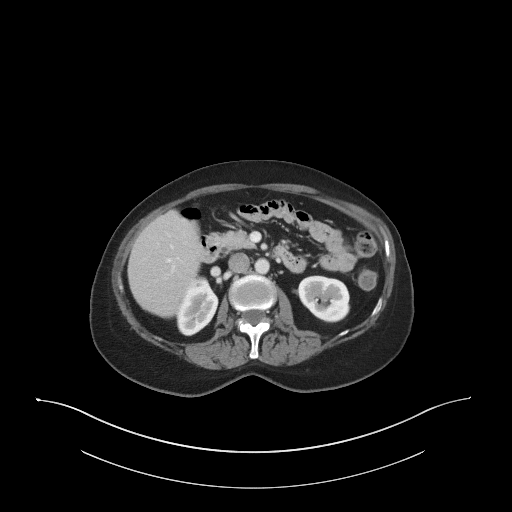
[im 59/89  bone]
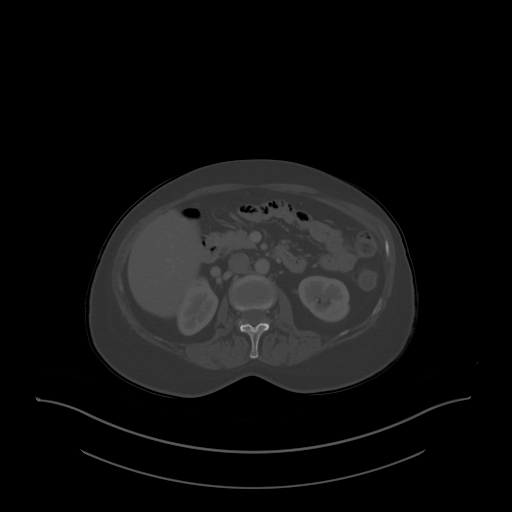
[im 64/89  soft-tissue]
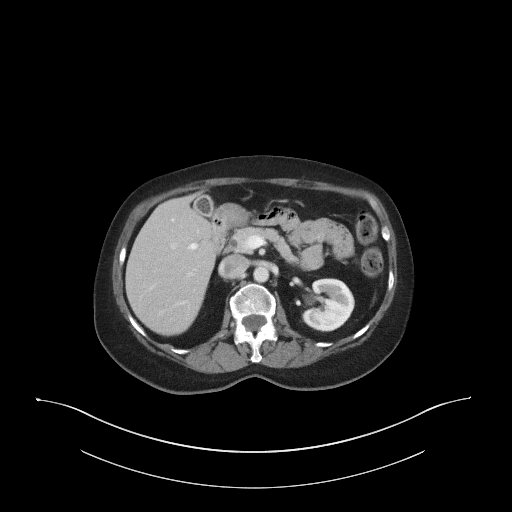
[im 69/89  soft-tissue]
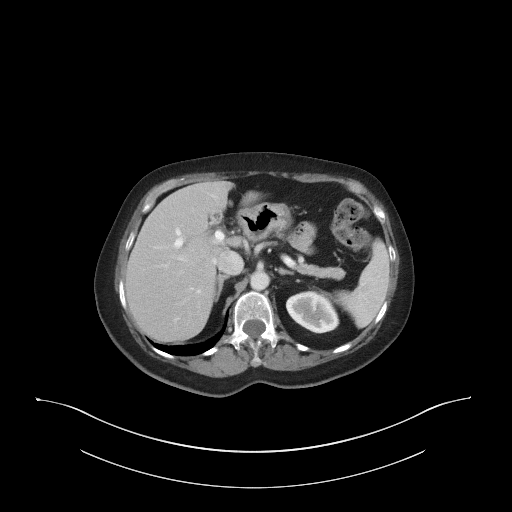
[im 79/89  soft-tissue]
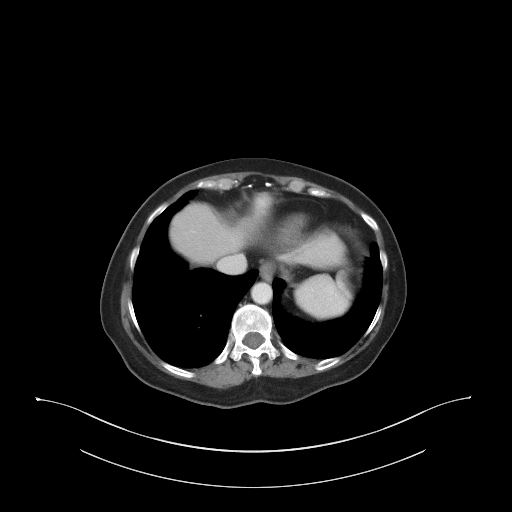
[im 84/89  soft-tissue]
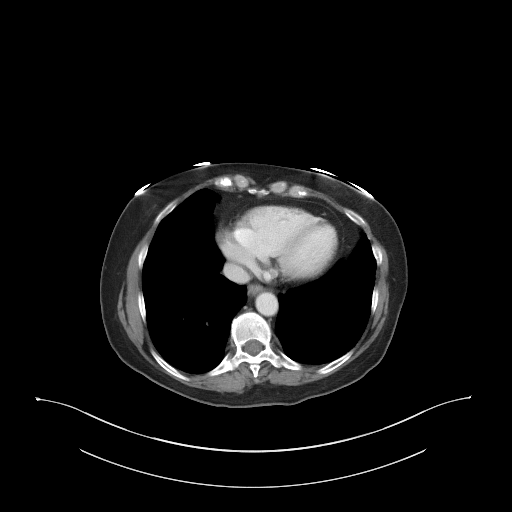

[Series 5: coronal st · coronal · 0.71mm/px · 3 of 81 slices shown]
[im 27/81  soft-tissue]
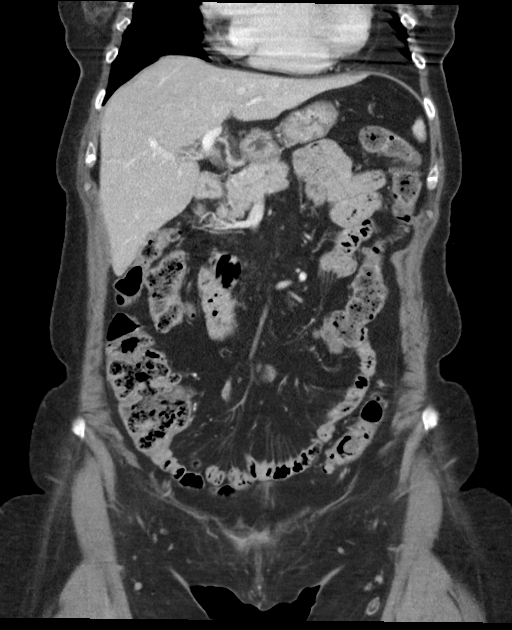
[im 36/81  soft-tissue]
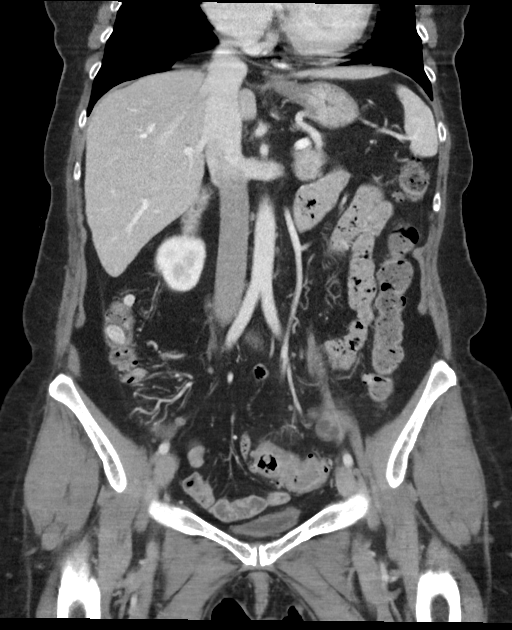
[im 45/81  soft-tissue]
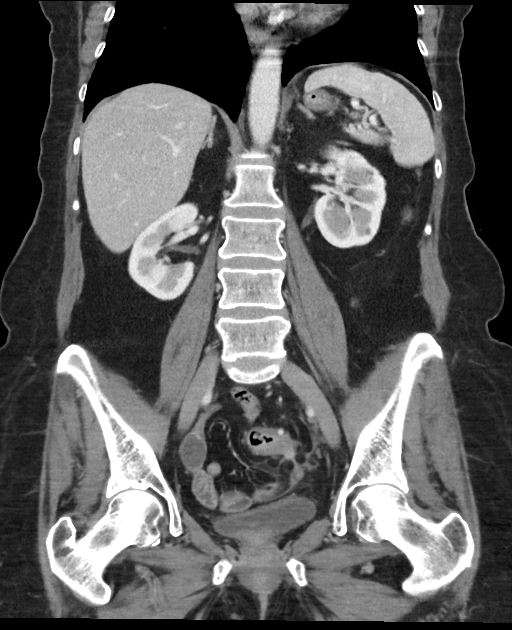

[16 of 46 positions shown; findings below may reference images not displayed]

FINDINGS: Lower chest: No acute abnormality.

Hepatobiliary: Small cyst in the right hepatic lobe, stable.
Gallbladder contracted, grossly unremarkable.

Pancreas: No focal abnormality or ductal dilatation.

Spleen: No focal abnormality.  Normal size.

Adrenals/Urinary Tract: No adrenal abnormality. No focal renal
abnormality. No stones or hydronephrosis. Urinary bladder is
unremarkable.

Stomach/Bowel: Diverticular disease throughout the colon, most
pronounced in the sigmoid colon. Inflammation and small extraluminal
collection of gas again noted but improved since prior study. The
extraluminal gas now measures up to 7 mm compared to 2.5 cm
previously. No bowel obstruction.

Vascular/Lymphatic: No evidence of aneurysm or adenopathy.

Reproductive: Prior hysterectomy. Small bilateral ovarian cysts,
stable.

Other: No free fluid.

Musculoskeletal: No acute bony abnormality.
IMPRESSION: Extraluminal gas collection and inflammation adjacent to the sigmoid
colon is again noted but improved since prior study. Gas collection
now 7 mm compared to 2.5 cm previously.

Colonic diverticulosis.

Stable bilateral ovarian cysts.

## 2020-06-18 MED ORDER — IOHEXOL 300 MG/ML  SOLN
100.0000 mL | Freq: Once | INTRAMUSCULAR | Status: AC | PRN
  Administered 2020-06-18: 100 mL via INTRAVENOUS

## 2020-06-23 ENCOUNTER — Telehealth: Payer: Self-pay

## 2020-06-23 NOTE — Telephone Encounter (Signed)
Referral was faxed to [facility] surgery so the patient could have a consult. They will contact the patient.

## 2020-06-24 ENCOUNTER — Telehealth: Payer: Self-pay | Admitting: Gastroenterology

## 2020-06-24 NOTE — Telephone Encounter (Signed)
Patient wants Dr T to know she was able to get an appointment with Dr Marcello Moores @ [place]  on 07/20/20 for upcoming surgery.

## 2020-06-29 ENCOUNTER — Encounter: Payer: Self-pay | Admitting: Internal Medicine

## 2020-06-29 ENCOUNTER — Ambulatory Visit (INDEPENDENT_AMBULATORY_CARE_PROVIDER_SITE_OTHER): Payer: Medicare HMO | Admitting: Internal Medicine

## 2020-06-29 VITALS — BP 124/67 | HR 57 | Ht 64.5 in | Wt 164.3 lb

## 2020-06-29 DIAGNOSIS — K51311 Ulcerative (chronic) rectosigmoiditis with rectal bleeding: Secondary | ICD-10-CM | POA: Diagnosis not present

## 2020-06-29 DIAGNOSIS — E663 Overweight: Secondary | ICD-10-CM

## 2020-06-29 DIAGNOSIS — R7989 Other specified abnormal findings of blood chemistry: Secondary | ICD-10-CM | POA: Diagnosis not present

## 2020-06-29 DIAGNOSIS — M810 Age-related osteoporosis without current pathological fracture: Secondary | ICD-10-CM | POA: Diagnosis not present

## 2020-06-29 DIAGNOSIS — Z1239 Encounter for other screening for malignant neoplasm of breast: Secondary | ICD-10-CM

## 2020-06-29 NOTE — Assessment & Plan Note (Signed)
Patient was advised to have a continued follow-up with a GI and: Surgeon in [place].  She is approved for the surgery if needed

## 2020-06-29 NOTE — Assessment & Plan Note (Signed)
Advised to continue taking vitamin D.

## 2020-06-29 NOTE — Assessment & Plan Note (Signed)
On vitamin D supplementation. She was advised to continue taking her vitamin calcium and Calciferol as directed.

## 2020-06-29 NOTE — Assessment & Plan Note (Signed)
-   I encouraged the patient to lose weight.  - I educated them on making healthy dietary choices including eating more fruits and vegetables and less fried foods. - I encouraged the patient to exercise more, and educated on the benefits of exercise including weight loss, diabetes prevention, and hypertension prevention.

## 2020-06-29 NOTE — Progress Notes (Signed)
New Patient Office Visit  Subjective:  Patient ID: Shirley Lowe, female    DOB: 06-25-1950  Age: 70 y.o. MRN: [id number]  CC:  Chief Complaint  Patient presents with  . New Patient (Initial Visit)    HPI Patient presents for   new office visit. Patient is known to have diverticulitis has recurrent abdominal pain secondary to rectosigmoid colitis. At the present time her symptoms are under control. He denies any history of chest pain shortness of breath paroxysmal nocturnal dyspnea or orthopnea. She can walk a block can walk up a flight of stairs. Patient is going to see: Surgeon at [facility] she will need a partial colectomy  Past Medical History:  Diagnosis Date  . Diverticulitis   . Diverticulitis   . Osteoporosis   . Ulcerative colitis      Current Outpatient Medications:  .  ascorbic acid (VITAMIN C) 1000 MG tablet, Take 1 tablet by mouth daily., Disp: , Rfl:  .  B Complex Vitamins (VITAMIN B-COMPLEX) TABS, Take 1 tablet by mouth daily., Disp: , Rfl:  .  b complex vitamins tablet, Take 1 tablet by mouth daily., Disp: , Rfl:  .  calcium carbonate (OSCAL) 1500 (600 Ca) MG TABS tablet, Take 1 tablet by mouth daily., Disp: , Rfl:  .  Cholecalciferol (VITAMIN D) 50 MCG (2000 UT) CAPS, Take 1 capsule (2,000 Units total) by mouth daily., Disp: 30 capsule, Rfl: 0 .  Lactobacillus Rhamnosus, GG, (RA PROBIOTIC DIGESTIVE CARE) CAPS, Take 1 capsule by mouth daily., Disp: , Rfl:  .  mesalamine (LIALDA) 1.2 g EC tablet, TAKE 1 TABLET (1.2 G TOTAL) BY MOUTH IN THE MORNING AND AT BEDTIME. (Patient taking differently: Take 2.4 g by mouth daily with breakfast. Takes both together), Disp: 180 tablet, Rfl: 1 .  Omega-3 Fatty Acids (FISH OIL) 1000 MG CAPS, Take 1 capsule by mouth daily., Disp: , Rfl:  .  vitamin E 100 UNIT capsule, Take 100 Units by mouth daily., Disp: , Rfl:  .  zinc gluconate 50 MG tablet, Take 1 tablet by mouth daily., Disp: , Rfl:    Past Surgical History:   Procedure Laterality Date  . ABDOMINAL HYSTERECTOMY    . COLONOSCOPY WITH PROPOFOL N/A 11/12/2019   Procedure: COLONOSCOPY WITH PROPOFOL;  Surgeon: Virgel Manifold, MD;  Location: [facility] ENDOSCOPY;  Service: Endoscopy;  Laterality: N/A;  . INCONTINENCE SURGERY    . TONSILLECTOMY    . TONSILLECTOMY      Family History  Problem Relation Age of Onset  . Cancer Mother        breast cancer  . Breast cancer Mother 67       metastic  . Cancer Father        pancreatic  . Hepatitis C Sister   . Thyroid disease Sister   . Irritable bowel syndrome Sister   . Allergies Sister     Social History   Socioeconomic History  . Marital status: Married    Spouse name: [name]  . Number of children: 3  . Years of education: Not on file  . Highest education level: Bachelor's degree (e.g., BA, AB, BS)  Occupational History  . Occupation: Retired  Tobacco Use  . Smoking status: Never Smoker  . Smokeless tobacco: Never Used  . Tobacco comment: smoking cessation materials not required  Vaping Use  . Vaping Use: Never used  Substance and Sexual Activity  . Alcohol use: No  . Drug use: No  . Sexual activity:  Not Currently  Other Topics Concern  . Not on file  Social History Narrative  . Not on file   Social Determinants of Health   Financial Resource Strain: Not on file  Food Insecurity: Not on file  Transportation Needs: Not on file  Physical Activity: Not on file  Stress: Not on file  Social Connections: Not on file  Intimate Partner Violence: Not on file    ROS Review of Systems  Constitutional: Negative.   HENT: Negative.  Negative for congestion, ear pain, nosebleeds, postnasal drip, sinus pressure, sore throat, tinnitus and trouble swallowing.   Eyes: Negative.   Respiratory: Negative.   Cardiovascular: Negative.   Gastrointestinal: Positive for abdominal pain. Negative for abdominal distention, anal bleeding, blood in stool, constipation, diarrhea and nausea.  Endocrine:  Negative.   Genitourinary: Negative.  Negative for dysuria.       History of hysterectomy  Musculoskeletal: Negative.  Negative for arthralgias, back pain and gait problem.  Skin: Negative.   Allergic/Immunologic: Negative.   Neurological: Negative.   Hematological: Negative.   Psychiatric/Behavioral: Negative.   All other systems reviewed and are negative.   Objective:   Today's Vitals: BP 124/67   Pulse (!) 57   Ht 5' 4.5" (1.638 m)   Wt 164 lb 4.8 oz (74.5 kg)   BMI 27.77 kg/m   Physical Exam Constitutional:      General: She is not in acute distress.    Appearance: Normal appearance.  HENT:     Nose: Nose normal.     Mouth/Throat:     Mouth: Mucous membranes are moist.     Pharynx: Oropharyngeal exudate present.  Cardiovascular:     Rate and Rhythm: Normal rate and regular rhythm.     Heart sounds: Normal heart sounds. No friction rub.  Pulmonary:     Breath sounds: No stridor.  Abdominal:     General: There is no distension.     Tenderness: There is no abdominal tenderness. There is no guarding or rebound.  Musculoskeletal:        General: Normal range of motion.     Cervical back: No rigidity.  Skin:    General: Skin is warm.  Neurological:     General: No focal deficit present.     Assessment & Plan:   Problem List Items Addressed This Visit      Digestive   Ulcerative rectosigmoiditis with rectal bleeding [facility]) - Primary    Patient was advised to have a continued follow-up with a GI and: Surgeon in [place].  She is approved for the surgery if needed        Musculoskeletal and Integument   Osteoporosis    Advised to continue taking vitamin D.        Other   Overweight (BMI 25.0-29.9) (Chronic)    - I encouraged the patient to lose weight.  - I educated them on making healthy dietary choices including eating more fruits and vegetables and less fried foods. - I encouraged the patient to exercise more, and educated on the benefits of exercise  including weight loss, diabetes prevention, and hypertension prevention.        Low vitamin D level    On vitamin D supplementation. She was advised to continue taking her vitamin calcium and Calciferol as directed.         Outpatient Encounter Medications as of 06/29/2020  Medication Sig  . ascorbic acid (VITAMIN C) 1000 MG tablet Take 1 tablet by mouth daily.  Marland Kitchen  B Complex Vitamins (VITAMIN B-COMPLEX) TABS Take 1 tablet by mouth daily.  Marland Kitchen b complex vitamins tablet Take 1 tablet by mouth daily.  . calcium carbonate (OSCAL) 1500 (600 Ca) MG TABS tablet Take 1 tablet by mouth daily.  . Cholecalciferol (VITAMIN D) 50 MCG (2000 UT) CAPS Take 1 capsule (2,000 Units total) by mouth daily.  . Lactobacillus Rhamnosus, GG, (RA PROBIOTIC DIGESTIVE CARE) CAPS Take 1 capsule by mouth daily.  . mesalamine (LIALDA) 1.2 g EC tablet TAKE 1 TABLET (1.2 G TOTAL) BY MOUTH IN THE MORNING AND AT BEDTIME. (Patient taking differently: Take 2.4 g by mouth daily with breakfast. Takes both together)  . Omega-3 Fatty Acids (FISH OIL) 1000 MG CAPS Take 1 capsule by mouth daily.  . vitamin E 100 UNIT capsule Take 100 Units by mouth daily.  Marland Kitchen zinc gluconate 50 MG tablet Take 1 tablet by mouth daily.   No facility-administered encounter medications on file as of 06/29/2020.    Follow-up: No follow-ups on file.   Cletis Athens, MD

## 2020-07-01 NOTE — Addendum Note (Signed)
Addended by: Alois Cliche on: 07/01/2020 10:24 AM   Modules accepted: Orders

## 2020-07-20 ENCOUNTER — Ambulatory Visit: Payer: Self-pay | Admitting: General Surgery

## 2020-07-20 NOTE — H&P (Signed)
The patient is a 70 year old female who presents with diverticulitis. 70 year old female with a remote history of ulcerative colitis requiring Asacol only presents to the office today to be evaluated for diverticular disease. Over the past year she has had several episodes of complicated diverticulitis. These have resolved with antibiotics. She underwent a colonoscopy late last year, which showed a torturous colon with some narrowing in the sigmoid colon. There was no sign of active inflammation or malignancy. At that time, she was on a non-therapeutic dose of mesalamine and she was told that she could stop this. Since the episode of diverticulitis in January. She has had some episodes of crampy abdominal pain that resolved rather quickly. She is having regular bowel movements. Past surgical history consist of hysterectomy and bladder sling. Patient has no major medical problems.   Past Surgical History Mammie Lorenzo, LPN; 12/09/9145 8:29 AM) Colon Polyp Removal - Colonoscopy Hysterectomy (not due to cancer) - Partial Tonsillectomy  Diagnostic Studies History Mammie Lorenzo, LPN; 5/62/1308 6:57 AM) Colonoscopy within last year Mammogram 1-3 years ago Pap Smear 1-5 years ago  Allergies Mammie Lorenzo, LPN; 8/46/9629 5:28 AM) Penicillins yeast infections Allergies Reconciled  Medication History Mammie Lorenzo, LPN; 08/12/2438 1:02 AM) Mesalamine (1.2GM Tablet DR, Oral) Active. Vitamin B Complex (Oral) Active. Vitamin C (500MG Capsule, Oral) Active. Vitamin E (400UNIT Tablet, Oral) Active. Fish Oil (1000MG Capsule, Oral) Active. Oscal 500/200 D-3 (500-200MG-UNIT Tablet, Oral) Active. Zinc Gluconate (50MG Tablet, Oral) Active. Probiotic (Oral) Active. Medications Reconciled  Social History Mammie Lorenzo, LPN; 11/23/3662 4:03 AM) No alcohol use No caffeine use No drug use Tobacco use Never smoker.  Family History Mammie Lorenzo, LPN; 4/74/2595 6:38  AM) Breast Cancer Mother. Ischemic Bowel Disease Sister. Malignant Neoplasm Of Pancreas Father. Seizure disorder Sister. Thyroid problems Sister.  Pregnancy / Birth History Mammie Lorenzo, LPN; 7/56/4332 9:51 AM) Age at menarche 80 years. Age of menopause 20-55 Gravida 3 Length (months) of breastfeeding 7-12 Maternal age 71-30 Para 3  Other Problems Mammie Lorenzo, LPN; 8/84/1660 6:30 AM) Bladder Problems Diverticulosis Other disease, cancer, significant illness Ulcerative Colitis     Review of Systems Mammie Lorenzo LPN; 1/60/1093 2:35 AM) General Present- Weight Loss. Not Present- Appetite Loss, Chills, Fatigue, Fever, Night Sweats and Weight Gain. Skin Not Present- Change in Wart/Mole, Dryness, Hives, Jaundice, New Lesions, Non-Healing Wounds, Rash and Ulcer. HEENT Present- Wears glasses/contact lenses. Not Present- Earache, Hearing Loss, Hoarseness, Nose Bleed, Oral Ulcers, Ringing in the Ears, Seasonal Allergies, Sinus Pain, Sore Throat, Visual Disturbances and Yellow Eyes. Respiratory Not Present- Bloody sputum, Chronic Cough, Difficulty Breathing, Snoring and Wheezing. Breast Not Present- Breast Mass, Breast Pain, Nipple Discharge and Skin Changes. Cardiovascular Not Present- Chest Pain, Difficulty Breathing Lying Down, Leg Cramps, Palpitations, Rapid Heart Rate, Shortness of Breath and Swelling of Extremities. Gastrointestinal Not Present- Abdominal Pain, Bloating, Bloody Stool, Change in Bowel Habits, Chronic diarrhea, Constipation, Difficulty Swallowing, Excessive gas, Gets full quickly at meals, Hemorrhoids, Indigestion, Nausea, Rectal Pain and Vomiting. Female Genitourinary Present- Frequency and Urgency. Not Present- Nocturia, Painful Urination and Pelvic Pain. Musculoskeletal Present- Back Pain. Not Present- Joint Pain, Joint Stiffness, Muscle Pain, Muscle Weakness and Swelling of Extremities. Neurological Not Present- Decreased Memory, Fainting,  Headaches, Numbness, Seizures, Tingling, Tremor, Trouble walking and Weakness. Psychiatric Not Present- Anxiety, Bipolar, Change in Sleep Pattern, Depression, Fearful and Frequent crying. Endocrine Not Present- Cold Intolerance, Excessive Hunger, Hair Changes, Heat Intolerance, Hot flashes and New Diabetes. Hematology Not Present- Blood Thinners, Easy Bruising, Excessive bleeding, Gland problems, HIV  and Persistent Infections.  Vitals Claiborne Billings Dockery LPN; 1/63/8453 6:46 AM) 07/20/2020 8:56 AM Weight: 161.6 lb Height: 64.5in Body Surface Area: 1.8 m Body Mass Index: 27.31 kg/m  Pulse: 83 (Regular)  BP: 120/68(Sitting, Left Arm, Standard)        Physical Exam Leighton Ruff MD; 12/01/2120 9:33 AM)  General Mental Status-Alert. General Appearance-Cooperative.  Abdomen Palpation/Percussion Palpation and Percussion of the abdomen reveal - Soft and Non Tender.    Assessment & Plan Leighton Ruff MD; 4/82/5003 9:30 AM)  DIVERTICULITIS, COLON (K57.32) Impression: 70 year old female who presents to the office for evaluation of complicated diverticulitis. Her last episode was in late January 2022. We will plan on performing a resection in late April or beginning of May. She does have a history of ulcerative colitis but this appears to be in remission off any therapeutic medications with a normal endoscopic evaluation of mucosa. We discussed that if she does indeed have active ulcerative colitis, she is at increased risk for anastomotic breakdown and leak. I think this risk is small but slightly higher than the average population. We discussed the alternative, which would be to do a total abdominal colectomy and ileorectal anastomosis. In my opinion, this would carry more risk and she agrees to proceed with sigmoidectomy. Last CT scan was reviewed and inflammation does appear to be near the left ureter. We will plan on getting a cystoscopy and firefighter injection prior to surgery.  We will then plan on performing a robotic partial colectomy. The surgery and anatomy were described to the patient as well as the risks of surgery and the possible complications. These include: Bleeding, deep abdominal infections and possible wound complications such as hernia and infection, damage to adjacent structures, leak of surgical connections, which can lead to other surgeries and possibly an ostomy, possible need for other procedures, such as abscess drains in radiology, possible prolonged hospital stay, possible diarrhea from removal of part of the colon, possible constipation from narcotics, possible bowel, bladder or sexual dysfunction if having rectal surgery, prolonged fatigue/weakness or appetite loss, possible early recurrence of of disease, possible complications of their medical problems such as heart disease or arrhythmias or lung problems, death (less than 1%). I believe the patient understands and wishes to proceed with the surgery.

## 2020-07-23 ENCOUNTER — Other Ambulatory Visit: Payer: Self-pay

## 2020-07-23 ENCOUNTER — Ambulatory Visit
Admission: RE | Admit: 2020-07-23 | Discharge: 2020-07-23 | Disposition: A | Discharge status: Home / Self Care | Payer: Medicare HMO | Source: Ambulatory Visit | Attending: Internal Medicine | Admitting: Internal Medicine

## 2020-07-23 DIAGNOSIS — Z1239 Encounter for other screening for malignant neoplasm of breast: Secondary | ICD-10-CM

## 2020-07-23 DIAGNOSIS — Z1231 Encounter for screening mammogram for malignant neoplasm of breast: Secondary | ICD-10-CM | POA: Diagnosis present

## 2020-07-23 IMAGING — MG MM DIGITAL SCREENING BILAT W/ TOMO AND CAD
8 series · 8 of 24 positions shown · non-contrast
Comparison: Previous exam(s).

CLINICAL DATA: Screening.

EXAM:
DIGITAL SCREENING BILATERAL MAMMOGRAM WITH TOMOSYNTHESIS AND CAD
TECHNIQUE: Bilateral screening digital craniocaudal and mediolateral oblique
mammograms were obtained. Bilateral screening digital breast
tomosynthesis was performed. The images were evaluated with
computer-aided detection.

[L MLO synth-2D]
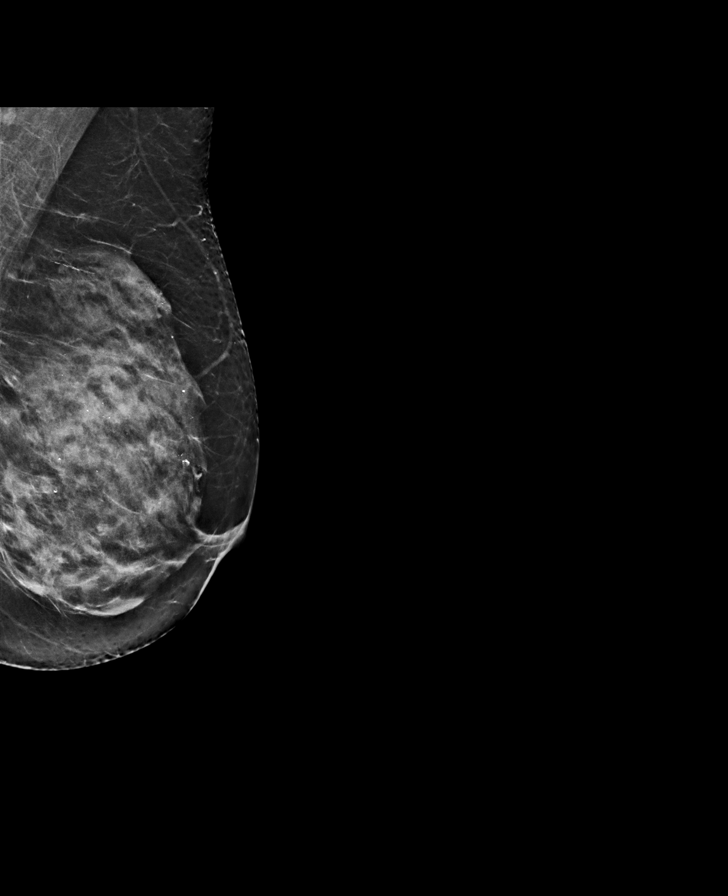

[L CC synth-2D]
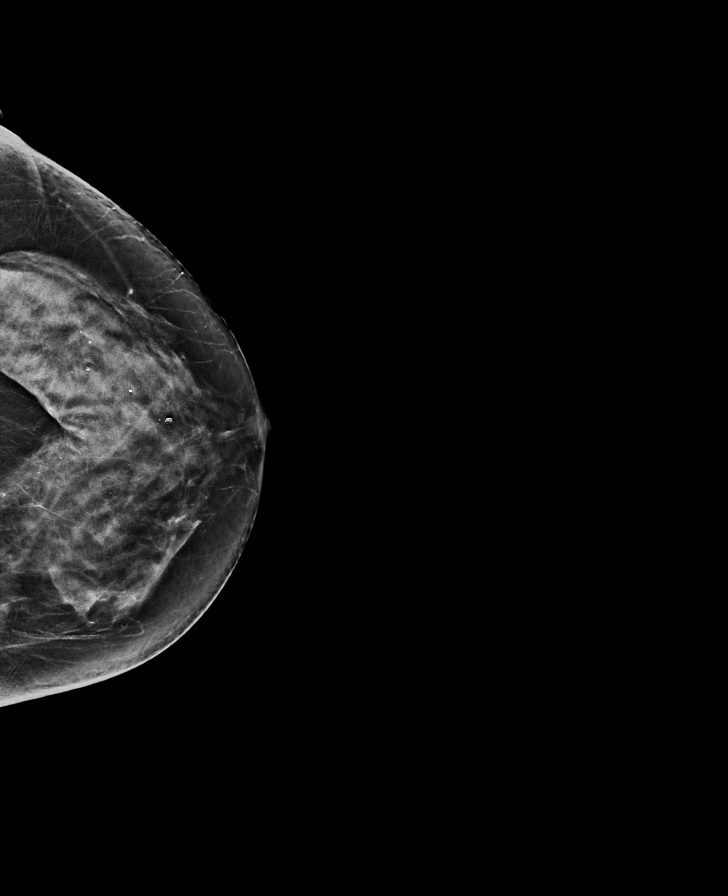

[R MLO synth-2D]
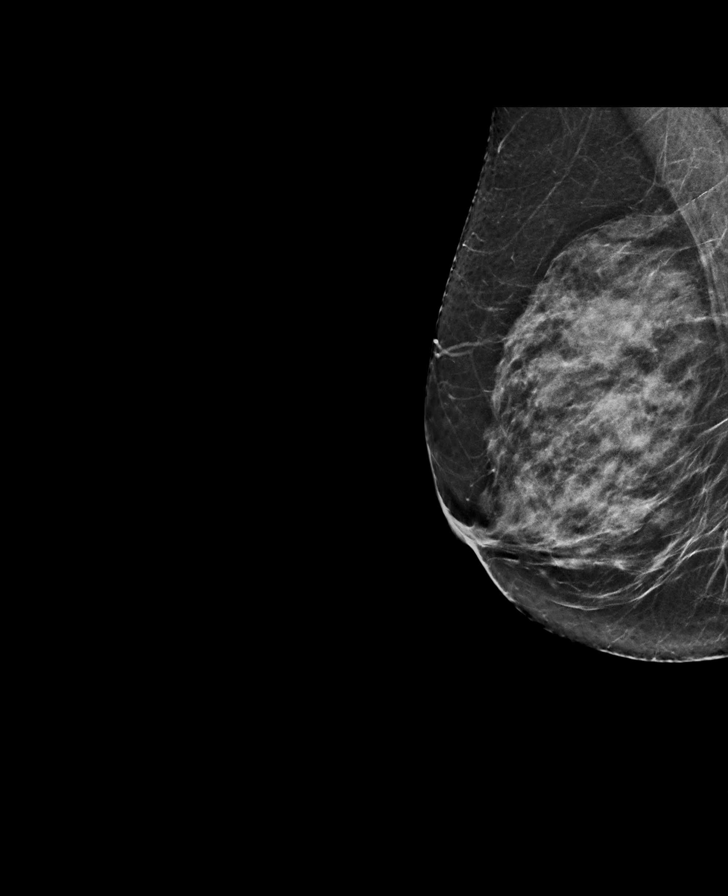

[R CC synth-2D]
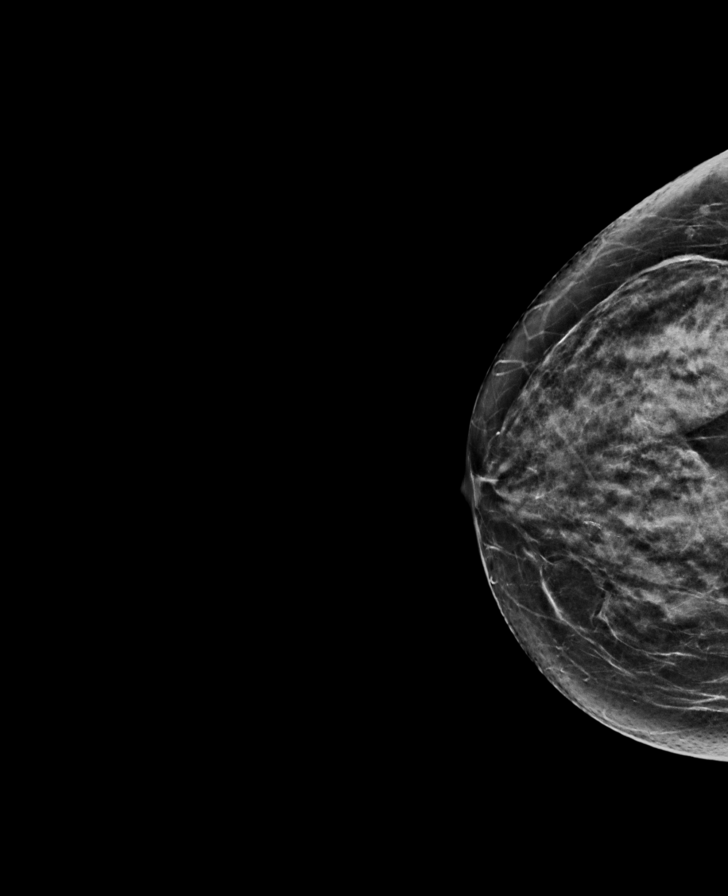

[R MLO tomo · tomo slice 35/68.0]
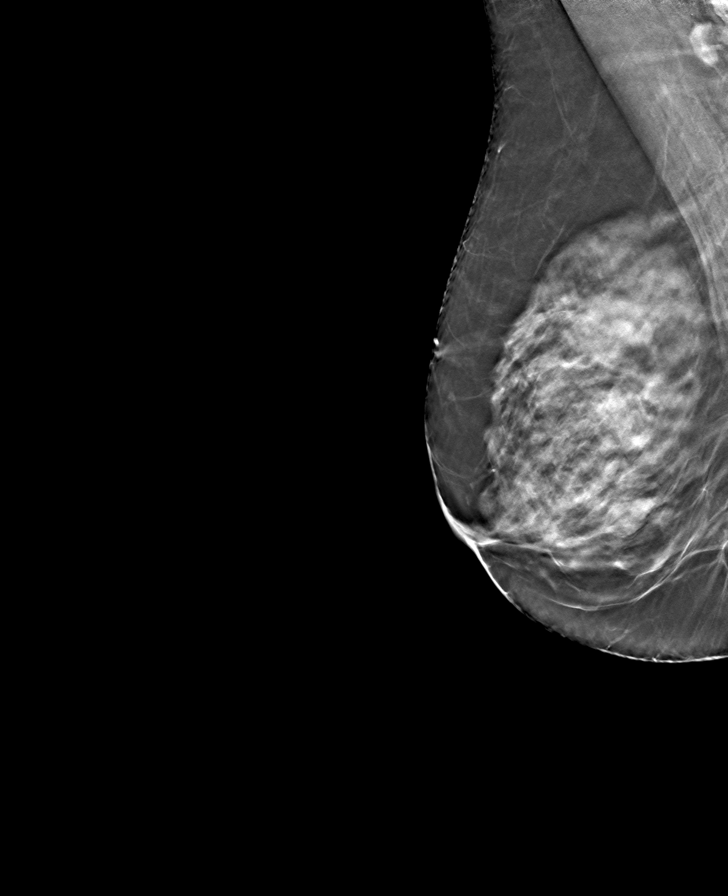

[L CC tomo · tomo slice 34/67.0]
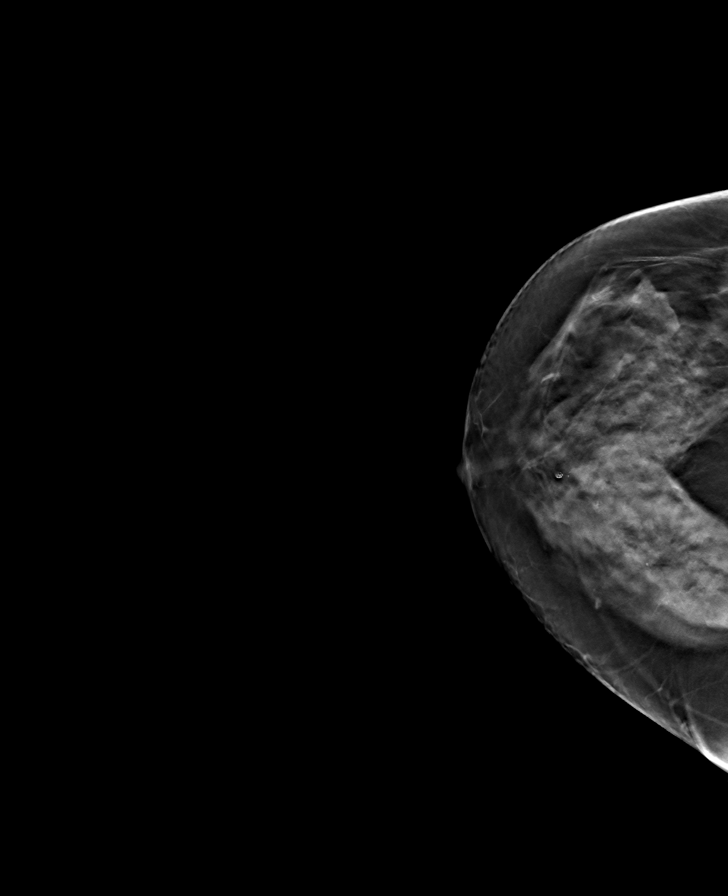

[L MLO tomo · tomo slice 35/68.0]
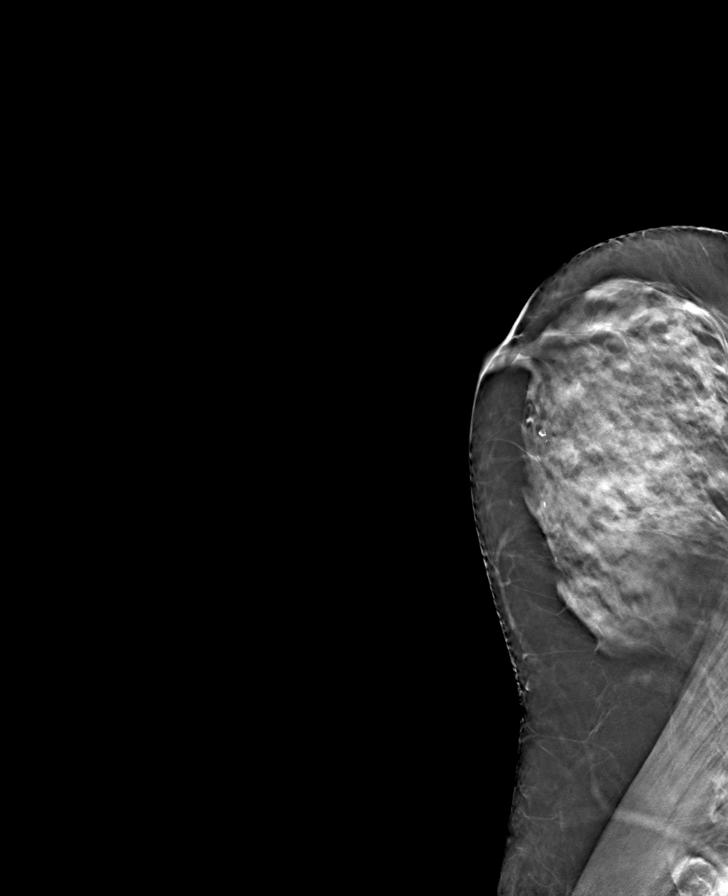

[R CC tomo · tomo slice 31/60.0]
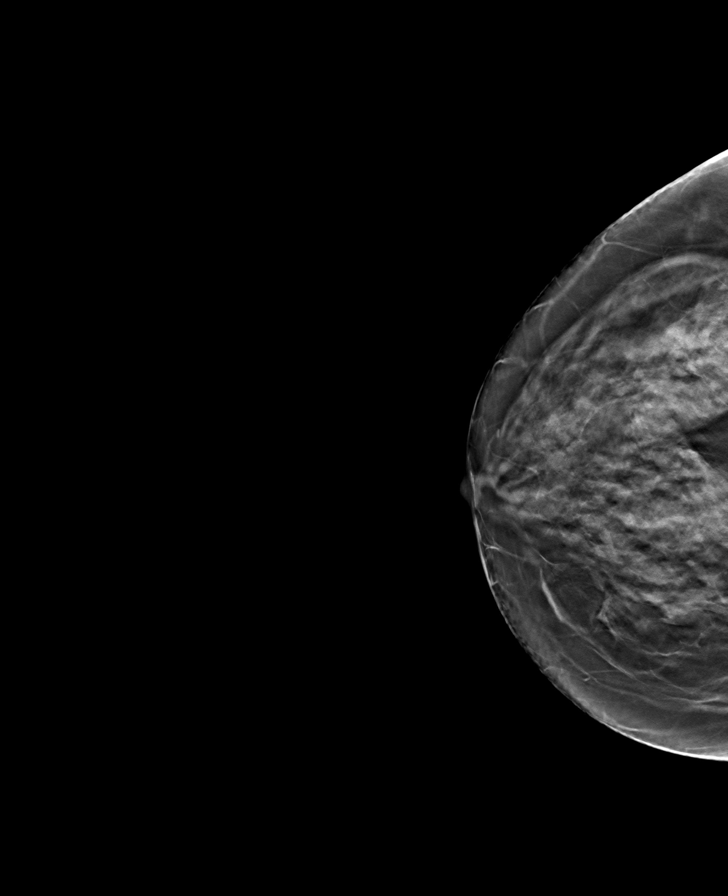

[8 of 24 positions shown; findings below may reference images not displayed]

ACR Breast Density Category c: The breast tissue is heterogeneously
dense, which may obscure small masses.
FINDINGS: There are no findings suspicious for malignancy. The images were
evaluated with computer-aided detection.
IMPRESSION: No mammographic evidence of malignancy. A result letter of this
screening mammogram will be mailed directly to the patient.

RECOMMENDATION:
Screening mammogram in one year. (Code:T4-5-GWO)

BI-RADS CATEGORY  1: Negative.

## 2020-09-09 ENCOUNTER — Other Ambulatory Visit (HOSPITAL_COMMUNITY): Payer: Self-pay

## 2020-09-09 NOTE — Progress Notes (Signed)
DUE TO COVID-19 ONLY ONE VISITOR IS ALLOWED TO COME WITH YOU AND STAY IN THE WAITING ROOM ONLY DURING PRE OP AND PROCEDURE DAY OF SURGERY. THE 1 VISITOR  MAY VISIT WITH YOU AFTER SURGERY IN YOUR PRIVATE ROOM DURING VISITING HOURS ONLY!  YOU NEED TO HAVE A COVID 19 TEST ON__5/23/2022 _____ @_______ , THIS TEST MUST BE DONE BEFORE SURGERY,  COVID TESTING SITE [address] [address], IT IS ON THE RIGHT GOING OUT [address] APPROXIMATELY  2 MINUTES PAST [employer] ON THE RIGHT. ONCE YOUR COVID TEST IS COMPLETED,  PLEASE BEGIN THE QUARANTINE INSTRUCTIONS AS OUTLINED IN YOUR HANDOUT.                Shirley Lowe  09/09/2020   Your procedure is scheduled on:  09/22/2020   Report to [facility] Main  Entrance   Report to admitting at     0630am      Call this number if you have problems the morning of surgery [phone]           REMEMBER: FOLLOW ALL YOUR BOWEL PREP INSTRUCTIONS WITH CLEAR LIQUIDS FROM YOUR SURGEON'S INSTRUCTIONS. DRINK 2 PRESURGERY ENSURE DRINKS THE NIGHT BEFORE SURGERY AT 1000 PM AND 1 PRESURGERY DRINK THE DAY OF THE PROCEDURE 3 HOURS PRIOR TO SCHEDULED SURGERY.  NOTHING BY MOUTH EXCEPT CLEAR LIQUIDS UNTIL THREE HOURS PRIOR TO SCHEDULED SURGERY. PLEASE FINISH PRESURGERY 3RD  ENSURE  DRINK PER SURGEON ORDER 3 HOURS PRIOR TO SCHEDULED SURGERY TIME WHICH NEEDS TO BE COMPLETED AT __0530am _______.     CLEAR LIQUID DIET   Foods Allowed                                                                      Coffee and tea, regular and decaf                           Plain Jell-O any favor except red or purple                                         Fruit ices (not with fruit pulp)                                      Iced Popsicles                                     Carbonated beverages, regular and diet                                    Cranberry, grape and apple juices Sports drinks like Gatorade Lightly seasoned clear broth or  consume(fat free) Sugar, honey syrup  _____________________________________________________________________      BRUSH YOUR TEETH MORNING OF SURGERY AND RINSE YOUR MOUTH OUT, NO CHEWING GUM CANDY OR MINTS.     Take  these medicines the morning of surgery with A SIP OF WATER: nonen   DO NOT TAKE ANY DIABETIC MEDICATIONS DAY OF YOUR SURGERY                               You may not have any metal on your body including hair pins and              piercings  Do not wear jewelry, make-up, lotions, powders or perfumes, deodorant             Do not wear nail polish on your fingernails.  Do not shave  48 hours prior to surgery.              Men may shave face and neck.   Do not bring valuables to the hospital. [place].  Contacts, dentures or bridgework may not be worn into surgery.  Leave suitcase in the car. After surgery it may be brought to your room.                 Please read over the following fact sheets you were given: _____________________________________________________________________  [facility] - Preparing for Surgery Before surgery, you can play an important role.  Because skin is not sterile, your skin needs to be as free of germs as possible.  You can reduce the number of germs on your skin by washing with CHG (chlorahexidine gluconate) soap before surgery.  CHG is an antiseptic cleaner which kills germs and bonds with the skin to continue killing germs even after washing. Please DO NOT use if you have an allergy to CHG or antibacterial soaps.  If your skin becomes reddened/irritated stop using the CHG and inform your nurse when you arrive at Short Stay. Do not shave (including legs and underarms) for at least 48 hours prior to the first CHG shower.  You may shave your face/neck. Please follow these instructions carefully:  1.  Shower with CHG Soap the night before surgery and the  morning of Surgery.  2.  If you choose to  wash your hair, wash your hair first as usual with your  normal  shampoo.  3.  After you shampoo, rinse your hair and body thoroughly to remove the  shampoo.                           4.  Use CHG as you would any other liquid soap.  You can apply chg directly  to the skin and wash                       Gently with a scrungie or clean washcloth.  5.  Apply the CHG Soap to your body ONLY FROM THE NECK DOWN.   Do not use on face/ open                           Wound or open sores. Avoid contact with eyes, ears mouth and genitals (private parts).                       Wash face,  Genitals (private parts) with your normal soap.  6.  Wash thoroughly, paying special attention to the area where your surgery  will be performed.  7.  Thoroughly rinse your body with warm water from the neck down.  8.  DO NOT shower/wash with your normal soap after using and rinsing off  the CHG Soap.                9.  Pat yourself dry with a clean towel.            10.  Wear clean pajamas.            11.  Place clean sheets on your bed the night of your first shower and do not  sleep with pets. Day of Surgery : Do not apply any lotions/deodorants the morning of surgery.  Please wear clean clothes to the hospital/surgery center.  FAILURE TO FOLLOW THESE INSTRUCTIONS MAY RESULT IN THE CANCELLATION OF YOUR SURGERY PATIENT SIGNATURE_________________________________  NURSE SIGNATURE__________________________________  ________________________________________________________________________

## 2020-09-13 ENCOUNTER — Encounter (HOSPITAL_COMMUNITY): Payer: Self-pay

## 2020-09-13 ENCOUNTER — Other Ambulatory Visit: Payer: Self-pay

## 2020-09-13 ENCOUNTER — Encounter (HOSPITAL_COMMUNITY)
Admission: RE | Admit: 2020-09-13 | Discharge: 2020-09-13 | Disposition: A | Discharge status: Home / Self Care | Payer: Medicare HMO | Source: Ambulatory Visit | Attending: General Surgery | Admitting: General Surgery

## 2020-09-13 HISTORY — DX: Dyspnea, unspecified: R06.00

## 2020-09-13 NOTE — Progress Notes (Addendum)
Anesthesia Review:  PCP: DR  Lavera Guise in [place] 06/29/20  Clearance for surgery in note.  Cardiologist : Chest x-ray : EKG : 11/27/19-epic Echo : Stress test: Cardiac Cath :  Activity level:  Pt reports being out of shape and gets short of breath with exertion  Sleep Study/ CPAP : none  Fasting Blood Sugar :      / Checks Blood Sugar -- times a day:   Blood Thinner/ Instructions /Last Dose: ASA / Instructions/ Last Dose :  PT forgot preop appt on 09/13/2020.  Medical history along with preop instructons completed via phone.  PT to come in on 09/14/20 at 1000 for labs.  PReop instructions along with ensure preop drinks and hibiclens placed in lab.  Pt reports at preop that Urologist is supposed to be helping with surgery  Spoke with Abigail Butts at [place] and she will speak with DR Marcello Moores in am of 09/14/2020 and be back in touch since there are no orders in epic for urology procedure and nothing listed on posting.

## 2020-09-14 ENCOUNTER — Encounter (HOSPITAL_COMMUNITY): Payer: Self-pay

## 2020-09-14 ENCOUNTER — Encounter (HOSPITAL_COMMUNITY)
Admission: RE | Admit: 2020-09-14 | Discharge: 2020-09-14 | Disposition: A | Discharge status: Home / Self Care | Payer: Medicare HMO | Source: Ambulatory Visit | Attending: General Surgery | Admitting: General Surgery

## 2020-09-14 DIAGNOSIS — Z01812 Encounter for preprocedural laboratory examination: Secondary | ICD-10-CM | POA: Insufficient documentation

## 2020-09-14 LAB — CBC
HCT: 43.7 % (ref 36.0–46.0)
Hemoglobin: 14 g/dL (ref 12.0–15.0)
MCH: 29.4 pg (ref 26.0–34.0)
MCHC: 32 g/dL (ref 30.0–36.0)
MCV: 91.8 fL (ref 80.0–100.0)
Platelets: 232 10*3/uL (ref 150–400)
RBC: 4.76 MIL/uL (ref 3.87–5.11)
RDW: 13.3 % (ref 11.5–15.5)
WBC: 5 10*3/uL (ref 4.0–10.5)
nRBC: 0 % (ref 0.0–0.2)

## 2020-09-14 NOTE — Progress Notes (Signed)
EKG-11/27/19-epic

## 2020-09-16 ENCOUNTER — Other Ambulatory Visit: Payer: Self-pay | Admitting: Urology

## 2020-09-20 ENCOUNTER — Other Ambulatory Visit (HOSPITAL_COMMUNITY)
Admission: RE | Admit: 2020-09-20 | Discharge: 2020-09-20 | Disposition: A | Discharge status: Home / Self Care | Payer: Medicare HMO | Source: Ambulatory Visit | Attending: General Surgery | Admitting: General Surgery

## 2020-09-20 DIAGNOSIS — Z01812 Encounter for preprocedural laboratory examination: Secondary | ICD-10-CM | POA: Insufficient documentation

## 2020-09-20 DIAGNOSIS — Z20822 Contact with and (suspected) exposure to covid-19: Secondary | ICD-10-CM | POA: Insufficient documentation

## 2020-09-20 LAB — SARS CORONAVIRUS 2 (TAT 6-24 HRS): SARS Coronavirus 2: NEGATIVE

## 2020-09-21 MED ORDER — BUPIVACAINE LIPOSOME 1.3 % IJ SUSP
20.0000 mL | INTRAMUSCULAR | Status: DC
  Filled 2020-09-21: qty 20

## 2020-09-21 NOTE — Anesthesia Preprocedure Evaluation (Addendum)
Anesthesia Evaluation  Patient identified by MRN, date of birth, ID band Patient awake    Reviewed: Allergy & Precautions, NPO status , Patient's Chart, lab work & pertinent test results  History of Anesthesia Complications Negative for: history of anesthetic complications  Airway Mallampati: I  TM Distance: >3 FB Neck ROM: Full    Dental  (+) Dental Advisory Given, Teeth Intact   Pulmonary neg pulmonary ROS,  09/20/2020 SARS coronavirus NEG   breath sounds clear to auscultation       Cardiovascular negative cardio ROS   Rhythm:Regular Rate:Normal     Neuro/Psych negative neurological ROS     GI/Hepatic Neg liver ROS, Ulcerative colitis Diverticular disease   Endo/Other  negative endocrine ROS  Renal/GU negative Renal ROS     Musculoskeletal   Abdominal   Peds  Hematology negative hematology ROS (+)   Anesthesia Other Findings   Reproductive/Obstetrics                            Anesthesia Physical Anesthesia Plan  ASA: II  Anesthesia Plan: General   Post-op Pain Management:    Induction: Intravenous  PONV Risk Score and Plan: 3 and Ondansetron, Dexamethasone and Treatment may vary due to age or medical condition  Airway Management Planned: Oral ETT  Additional Equipment: None  Intra-op Plan:   Post-operative Plan: Extubation in OR  Informed Consent: I have reviewed the patients History and Physical, chart, labs and discussed the procedure including the risks, benefits and alternatives for the proposed anesthesia with the patient or authorized representative who has indicated his/her understanding and acceptance.     Dental advisory given  Plan Discussed with: CRNA and Surgeon  Anesthesia Plan Comments:        Anesthesia Quick Evaluation

## 2020-09-22 ENCOUNTER — Inpatient Hospital Stay (HOSPITAL_COMMUNITY): Payer: Medicare HMO | Admitting: Certified Registered Nurse Anesthetist

## 2020-09-22 ENCOUNTER — Inpatient Hospital Stay (HOSPITAL_COMMUNITY): Payer: Medicare HMO | Admitting: Physician Assistant

## 2020-09-22 ENCOUNTER — Inpatient Hospital Stay (HOSPITAL_COMMUNITY)
Admission: RE | Admit: 2020-09-22 | Discharge: 2020-09-24 | DRG: 331 | Disposition: A | Discharge status: Home / Self Care | Payer: Medicare HMO | Source: Ambulatory Visit | Attending: General Surgery | Admitting: General Surgery

## 2020-09-22 ENCOUNTER — Encounter (HOSPITAL_COMMUNITY): Payer: Self-pay | Admitting: General Surgery

## 2020-09-22 ENCOUNTER — Encounter (HOSPITAL_COMMUNITY)
Admission: RE | Disposition: A | Discharge status: Home / Self Care | Payer: Self-pay | Source: Ambulatory Visit | Attending: General Surgery

## 2020-09-22 ENCOUNTER — Other Ambulatory Visit: Payer: Self-pay

## 2020-09-22 DIAGNOSIS — Z800 Family history of malignant neoplasm of digestive organs: Secondary | ICD-10-CM | POA: Diagnosis not present

## 2020-09-22 DIAGNOSIS — Z820 Family history of epilepsy and other diseases of the nervous system: Secondary | ICD-10-CM | POA: Diagnosis not present

## 2020-09-22 DIAGNOSIS — Z20822 Contact with and (suspected) exposure to covid-19: Secondary | ICD-10-CM | POA: Diagnosis present

## 2020-09-22 DIAGNOSIS — K5780 Diverticulitis of intestine, part unspecified, with perforation and abscess without bleeding: Secondary | ICD-10-CM | POA: Diagnosis present

## 2020-09-22 DIAGNOSIS — Z803 Family history of malignant neoplasm of breast: Secondary | ICD-10-CM

## 2020-09-22 DIAGNOSIS — K5790 Diverticulosis of intestine, part unspecified, without perforation or abscess without bleeding: Secondary | ICD-10-CM | POA: Diagnosis present

## 2020-09-22 DIAGNOSIS — K5730 Diverticulosis of large intestine without perforation or abscess without bleeding: Principal | ICD-10-CM | POA: Diagnosis present

## 2020-09-22 LAB — TYPE AND SCREEN
ABO/RH(D): B POS
Antibody Screen: NEGATIVE

## 2020-09-22 LAB — ABO/RH: ABO/RH(D): B POS

## 2020-09-22 SURGERY — COLECTOMY, PARTIAL, ROBOT-ASSISTED, LAPAROSCOPIC
Anesthesia: General | Site: Ureter

## 2020-09-22 MED ORDER — KETAMINE HCL 10 MG/ML IJ SOLN
INTRAMUSCULAR | Status: AC
  Filled 2020-09-22: qty 1

## 2020-09-22 MED ORDER — EPHEDRINE SULFATE-NACL 50-0.9 MG/10ML-% IV SOSY
PREFILLED_SYRINGE | INTRAVENOUS | Status: DC | PRN
  Administered 2020-09-22: 10 mg via INTRAVENOUS
  Administered 2020-09-22: 5 mg via INTRAVENOUS
  Administered 2020-09-22: 10 mg via INTRAVENOUS

## 2020-09-22 MED ORDER — LACTATED RINGERS IV SOLN: INTRAVENOUS | Status: DC

## 2020-09-22 MED ORDER — LACTATED RINGERS IR SOLN
Status: DC | PRN
  Administered 2020-09-22: 1000 mL

## 2020-09-22 MED ORDER — MIDAZOLAM HCL 5 MG/5ML IJ SOLN
INTRAMUSCULAR | Status: DC | PRN
  Administered 2020-09-22: 2 mg via INTRAVENOUS

## 2020-09-22 MED ORDER — PHENYLEPHRINE HCL-NACL 10-0.9 MG/250ML-% IV SOLN
INTRAVENOUS | Status: AC
  Filled 2020-09-22: qty 250

## 2020-09-22 MED ORDER — ROCURONIUM BROMIDE 10 MG/ML (PF) SYRINGE
PREFILLED_SYRINGE | INTRAVENOUS | Status: AC
  Filled 2020-09-22: qty 10

## 2020-09-22 MED ORDER — DEXAMETHASONE SODIUM PHOSPHATE 10 MG/ML IJ SOLN
INTRAMUSCULAR | Status: AC
  Filled 2020-09-22: qty 1

## 2020-09-22 MED ORDER — ALUM & MAG HYDROXIDE-SIMETH 200-200-20 MG/5ML PO SUSP: 30.0000 mL | Freq: Four times a day (QID) | ORAL | Status: DC | PRN

## 2020-09-22 MED ORDER — DIPHENHYDRAMINE HCL 12.5 MG/5ML PO ELIX: 12.5000 mg | ORAL_SOLUTION | Freq: Four times a day (QID) | ORAL | Status: DC | PRN

## 2020-09-22 MED ORDER — INDOCYANINE GREEN 25 MG IV SOLR
INTRAVENOUS | Status: DC | PRN
  Administered 2020-09-22: 7.5 mg via INTRAVENOUS

## 2020-09-22 MED ORDER — OXYCODONE HCL 5 MG PO TABS: 5.0000 mg | ORAL_TABLET | Freq: Once | ORAL | Status: DC | PRN

## 2020-09-22 MED ORDER — PROPOFOL 10 MG/ML IV BOLUS
INTRAVENOUS | Status: AC
  Filled 2020-09-22: qty 20

## 2020-09-22 MED ORDER — OXYCODONE HCL 5 MG/5ML PO SOLN
5.0000 mg | Freq: Once | ORAL | Status: DC | PRN
Start: 2020-09-22 — End: 2020-09-22

## 2020-09-22 MED ORDER — SODIUM CHLORIDE 0.9 % IR SOLN
Status: DC | PRN
  Administered 2020-09-22: 1000 mL

## 2020-09-22 MED ORDER — ACETAMINOPHEN 500 MG PO TABS
1000.0000 mg | ORAL_TABLET | ORAL | Status: AC
  Administered 2020-09-22: 1000 mg via ORAL
  Filled 2020-09-22: qty 2

## 2020-09-22 MED ORDER — STERILE WATER FOR INJECTION IJ SOLN
INTRAMUSCULAR | Status: AC
  Filled 2020-09-22: qty 10

## 2020-09-22 MED ORDER — SACCHAROMYCES BOULARDII 250 MG PO CAPS
250.0000 mg | ORAL_CAPSULE | Freq: Two times a day (BID) | ORAL | Status: DC
  Administered 2020-09-22 – 2020-09-23 (×3): 250 mg via ORAL
  Filled 2020-09-22 (×3): qty 1

## 2020-09-22 MED ORDER — ONDANSETRON HCL 4 MG/2ML IJ SOLN
INTRAMUSCULAR | Status: AC
  Filled 2020-09-22: qty 2

## 2020-09-22 MED ORDER — ENOXAPARIN SODIUM 40 MG/0.4ML IJ SOSY
40.0000 mg | PREFILLED_SYRINGE | INTRAMUSCULAR | Status: DC
  Administered 2020-09-23: 40 mg via SUBCUTANEOUS
  Filled 2020-09-22: qty 0.4

## 2020-09-22 MED ORDER — LACTATED RINGERS IV SOLN: INTRAVENOUS | Status: DC | PRN

## 2020-09-22 MED ORDER — ONDANSETRON HCL 4 MG/2ML IJ SOLN: 4.0000 mg | Freq: Four times a day (QID) | INTRAMUSCULAR | Status: DC | PRN

## 2020-09-22 MED ORDER — ACETAMINOPHEN 500 MG PO TABS: 1000.0000 mg | ORAL_TABLET | Freq: Once | ORAL | Status: DC

## 2020-09-22 MED ORDER — GLYCOPYRROLATE PF 0.2 MG/ML IJ SOSY
PREFILLED_SYRINGE | INTRAMUSCULAR | Status: DC | PRN
  Administered 2020-09-22: .2 mg via INTRAVENOUS

## 2020-09-22 MED ORDER — ROCURONIUM BROMIDE 10 MG/ML (PF) SYRINGE
PREFILLED_SYRINGE | INTRAVENOUS | Status: DC | PRN
  Administered 2020-09-22: 60 mg via INTRAVENOUS
  Administered 2020-09-22: 30 mg via INTRAVENOUS

## 2020-09-22 MED ORDER — FENTANYL CITRATE (PF) 250 MCG/5ML IJ SOLN
INTRAMUSCULAR | Status: DC | PRN
  Administered 2020-09-22: 150 ug via INTRAVENOUS
  Administered 2020-09-22 (×2): 50 ug via INTRAVENOUS

## 2020-09-22 MED ORDER — 0.9 % SODIUM CHLORIDE (POUR BTL) OPTIME
TOPICAL | Status: DC | PRN
  Administered 2020-09-22: 2000 mL

## 2020-09-22 MED ORDER — MIDAZOLAM HCL 2 MG/2ML IJ SOLN: 0.5000 mg | Freq: Once | INTRAMUSCULAR | Status: DC | PRN

## 2020-09-22 MED ORDER — PROMETHAZINE HCL 25 MG/ML IJ SOLN: 6.2500 mg | INTRAMUSCULAR | Status: DC | PRN

## 2020-09-22 MED ORDER — ENSURE SURGERY PO LIQD: 237.0000 mL | Freq: Two times a day (BID) | ORAL | Status: DC

## 2020-09-22 MED ORDER — KCL IN DEXTROSE-NACL 20-5-0.45 MEQ/L-%-% IV SOLN
INTRAVENOUS | Status: DC
  Filled 2020-09-22: qty 1000

## 2020-09-22 MED ORDER — LIDOCAINE HCL (PF) 2 % IJ SOLN
INTRAMUSCULAR | Status: DC | PRN
  Administered 2020-09-22: 1.5 mg/kg/h via INTRADERMAL

## 2020-09-22 MED ORDER — ACETAMINOPHEN 500 MG PO TABS
1000.0000 mg | ORAL_TABLET | Freq: Four times a day (QID) | ORAL | Status: DC
  Administered 2020-09-22 – 2020-09-24 (×3): 1000 mg via ORAL
  Filled 2020-09-22 (×5): qty 2

## 2020-09-22 MED ORDER — BUPIVACAINE LIPOSOME 1.3 % IJ SUSP
INTRAMUSCULAR | Status: DC | PRN
  Administered 2020-09-22: 20 mL

## 2020-09-22 MED ORDER — ALVIMOPAN 12 MG PO CAPS: 12.0000 mg | ORAL_CAPSULE | Freq: Two times a day (BID) | ORAL | Status: DC

## 2020-09-22 MED ORDER — ALVIMOPAN 12 MG PO CAPS
12.0000 mg | ORAL_CAPSULE | ORAL | Status: AC
  Administered 2020-09-22: 12 mg via ORAL
  Filled 2020-09-22: qty 1

## 2020-09-22 MED ORDER — BUPIVACAINE-EPINEPHRINE 0.25% -1:200000 IJ SOLN
INTRAMUSCULAR | Status: DC | PRN
  Administered 2020-09-22: 30 mL

## 2020-09-22 MED ORDER — ONDANSETRON HCL 4 MG PO TABS: 4.0000 mg | ORAL_TABLET | Freq: Four times a day (QID) | ORAL | Status: DC | PRN

## 2020-09-22 MED ORDER — DEXAMETHASONE SODIUM PHOSPHATE 10 MG/ML IJ SOLN
INTRAMUSCULAR | Status: DC | PRN
  Administered 2020-09-22: 8 mg via INTRAVENOUS

## 2020-09-22 MED ORDER — SUGAMMADEX SODIUM 200 MG/2ML IV SOLN
INTRAVENOUS | Status: DC | PRN
  Administered 2020-09-22: 200 mg via INTRAVENOUS

## 2020-09-22 MED ORDER — SIMETHICONE 80 MG PO CHEW: 40.0000 mg | CHEWABLE_TABLET | Freq: Four times a day (QID) | ORAL | Status: DC | PRN

## 2020-09-22 MED ORDER — SODIUM CHLORIDE 0.9 % IV SOLN
2.0000 g | INTRAVENOUS | Status: AC
  Administered 2020-09-22: 2 g via INTRAVENOUS
  Filled 2020-09-22: qty 2

## 2020-09-22 MED ORDER — MEPERIDINE HCL 50 MG/ML IJ SOLN: 6.2500 mg | INTRAMUSCULAR | Status: DC | PRN

## 2020-09-22 MED ORDER — LIDOCAINE 2% (20 MG/ML) 5 ML SYRINGE
INTRAMUSCULAR | Status: AC
  Filled 2020-09-22: qty 5

## 2020-09-22 MED ORDER — PROPOFOL 10 MG/ML IV BOLUS
INTRAVENOUS | Status: DC | PRN
  Administered 2020-09-22: 100 mg via INTRAVENOUS

## 2020-09-22 MED ORDER — HYDROMORPHONE HCL 2 MG/ML IJ SOLN
INTRAMUSCULAR | Status: AC
  Filled 2020-09-22: qty 1

## 2020-09-22 MED ORDER — DIPHENHYDRAMINE HCL 50 MG/ML IJ SOLN
INTRAMUSCULAR | Status: DC | PRN
  Administered 2020-09-22: 12.5 mg via INTRAVENOUS

## 2020-09-22 MED ORDER — INDOCYANINE GREEN 25 MG IV SOLR
INTRAVENOUS | Status: DC | PRN
  Administered 2020-09-22: 20 mg

## 2020-09-22 MED ORDER — HYDROMORPHONE HCL 1 MG/ML IJ SOLN
INTRAMUSCULAR | Status: DC | PRN
  Administered 2020-09-22: .2 mg via INTRAVENOUS
  Administered 2020-09-22: .4 mg via INTRAVENOUS
  Administered 2020-09-22: .2 mg via INTRAVENOUS

## 2020-09-22 MED ORDER — MIDAZOLAM HCL 2 MG/2ML IJ SOLN
INTRAMUSCULAR | Status: AC
  Filled 2020-09-22: qty 2

## 2020-09-22 MED ORDER — KETAMINE HCL 10 MG/ML IJ SOLN
INTRAMUSCULAR | Status: DC | PRN
  Administered 2020-09-22: 30 mg via INTRAVENOUS

## 2020-09-22 MED ORDER — HYDROMORPHONE HCL 1 MG/ML IJ SOLN: 0.5000 mg | INTRAMUSCULAR | Status: DC | PRN

## 2020-09-22 MED ORDER — FENTANYL CITRATE (PF) 250 MCG/5ML IJ SOLN
INTRAMUSCULAR | Status: AC
  Filled 2020-09-22: qty 5

## 2020-09-22 MED ORDER — DIPHENHYDRAMINE HCL 50 MG/ML IJ SOLN
12.5000 mg | Freq: Four times a day (QID) | INTRAMUSCULAR | Status: DC | PRN
Start: 2020-09-22 — End: 2020-09-24

## 2020-09-22 MED ORDER — BUPIVACAINE-EPINEPHRINE (PF) 0.25% -1:200000 IJ SOLN
INTRAMUSCULAR | Status: AC
  Filled 2020-09-22: qty 30

## 2020-09-22 MED ORDER — PHENYLEPHRINE HCL-NACL 10-0.9 MG/250ML-% IV SOLN
INTRAVENOUS | Status: DC | PRN
  Administered 2020-09-22: 50 ug/min via INTRAVENOUS

## 2020-09-22 MED ORDER — ENSURE PRE-SURGERY PO LIQD
296.0000 mL | Freq: Once | ORAL | Status: DC
  Filled 2020-09-22: qty 296

## 2020-09-22 MED ORDER — HYDROMORPHONE HCL 1 MG/ML IJ SOLN: 0.2500 mg | INTRAMUSCULAR | Status: DC | PRN

## 2020-09-22 MED ORDER — ENSURE PRE-SURGERY PO LIQD
592.0000 mL | Freq: Once | ORAL | Status: DC
  Filled 2020-09-22: qty 592

## 2020-09-22 MED ORDER — CHLORHEXIDINE GLUCONATE 0.12 % MT SOLN
15.0000 mL | Freq: Once | OROMUCOSAL | Status: AC
  Administered 2020-09-22: 15 mL via OROMUCOSAL

## 2020-09-22 MED ORDER — EPHEDRINE 5 MG/ML INJ
INTRAVENOUS | Status: AC
  Filled 2020-09-22: qty 10

## 2020-09-22 MED ORDER — ORAL CARE MOUTH RINSE: 15.0000 mL | Freq: Once | OROMUCOSAL | Status: AC

## 2020-09-22 MED ORDER — GABAPENTIN 300 MG PO CAPS
300.0000 mg | ORAL_CAPSULE | ORAL | Status: AC
  Administered 2020-09-22: 300 mg via ORAL
  Filled 2020-09-22: qty 1

## 2020-09-22 MED ORDER — PHENYLEPHRINE 40 MCG/ML (10ML) SYRINGE FOR IV PUSH (FOR BLOOD PRESSURE SUPPORT)
PREFILLED_SYRINGE | INTRAVENOUS | Status: AC
  Filled 2020-09-22: qty 10

## 2020-09-22 SURGICAL SUPPLY — 92 items
BLADE EXTENDED COATED 6.5IN (ELECTRODE) IMPLANT
CANNULA REDUC XI 12-8 STAPL (CANNULA)
CANNULA REDUCER 12-8 DVNC XI (CANNULA) IMPLANT
CATH INTERMIT  6FR 70CM (CATHETERS) ×1 IMPLANT
CELLS DAT CNTRL 66122 CELL SVR (MISCELLANEOUS) IMPLANT
COVER SURGICAL LIGHT HANDLE (MISCELLANEOUS) ×6 IMPLANT
COVER TIP SHEARS 8 DVNC (MISCELLANEOUS) ×2 IMPLANT
COVER TIP SHEARS 8MM DA VINCI (MISCELLANEOUS) ×1
COVER WAND RF STERILE (DRAPES) ×2 IMPLANT
DECANTER SPIKE VIAL GLASS SM (MISCELLANEOUS) IMPLANT
DERMABOND ADVANCED (GAUZE/BANDAGES/DRESSINGS) ×1
DERMABOND ADVANCED .7 DNX12 (GAUZE/BANDAGES/DRESSINGS) IMPLANT
DRAIN CHANNEL 19F RND (DRAIN) IMPLANT
DRAPE ARM DVNC X/XI (DISPOSABLE) ×8 IMPLANT
DRAPE COLUMN DVNC XI (DISPOSABLE) ×2 IMPLANT
DRAPE DA VINCI XI ARM (DISPOSABLE) ×4
DRAPE DA VINCI XI COLUMN (DISPOSABLE) ×1
DRAPE SURG IRRIG POUCH 19X23 (DRAPES) ×3 IMPLANT
DRSG OPSITE POSTOP 4X10 (GAUZE/BANDAGES/DRESSINGS) IMPLANT
DRSG OPSITE POSTOP 4X6 (GAUZE/BANDAGES/DRESSINGS) ×1 IMPLANT
DRSG OPSITE POSTOP 4X8 (GAUZE/BANDAGES/DRESSINGS) IMPLANT
ELECT PENCIL ROCKER SW 15FT (MISCELLANEOUS) ×3 IMPLANT
ELECT REM PT RETURN 15FT ADLT (MISCELLANEOUS) ×3 IMPLANT
ENDOLOOP SUT PDS II  0 18 (SUTURE)
ENDOLOOP SUT PDS II 0 18 (SUTURE) IMPLANT
EVACUATOR SILICONE 100CC (DRAIN) IMPLANT
GLOVE SURG ENC MOIS LTX SZ6.5 (GLOVE) ×9 IMPLANT
GLOVE SURG UNDER POLY LF SZ7 (GLOVE) ×6 IMPLANT
GOWN STRL REUS W/TWL XL LVL3 (GOWN DISPOSABLE) ×9 IMPLANT
GRASPER SUT TROCAR 14GX15 (MISCELLANEOUS) IMPLANT
HOLDER FOLEY CATH W/STRAP (MISCELLANEOUS) ×3 IMPLANT
IRRIG SUCT STRYKERFLOW 2 WTIP (MISCELLANEOUS) ×3
IRRIGATION SUCT STRKRFLW 2 WTP (MISCELLANEOUS) ×2 IMPLANT
KIT PROCEDURE DA VINCI SI (MISCELLANEOUS)
KIT PROCEDURE DVNC SI (MISCELLANEOUS) IMPLANT
KIT TURNOVER KIT A (KITS) ×3 IMPLANT
NDL INSUFFLATION 14GA 120MM (NEEDLE) ×2 IMPLANT
NEEDLE INSUFFLATION 14GA 120MM (NEEDLE) ×3 IMPLANT
PACK CARDIOVASCULAR III (CUSTOM PROCEDURE TRAY) ×3 IMPLANT
PACK COLON (CUSTOM PROCEDURE TRAY) ×3 IMPLANT
PACK CYSTO (CUSTOM PROCEDURE TRAY) ×1 IMPLANT
PAD POSITIONING PINK XL (MISCELLANEOUS) ×3 IMPLANT
PORT LAP GEL ALEXIS MED 5-9CM (MISCELLANEOUS) IMPLANT
RELOAD STAPLE 60 3.5 BLU DVNC (STAPLE) IMPLANT
RELOAD STAPLE 60 4.3 GRN DVNC (STAPLE) IMPLANT
RELOAD STAPLER 3.5X60 BLU DVNC (STAPLE) IMPLANT
RELOAD STAPLER 4.3X60 GRN DVNC (STAPLE) ×2 IMPLANT
RETRACTOR WND ALEXIS 18 MED (MISCELLANEOUS) IMPLANT
RTRCTR WOUND ALEXIS 18CM MED (MISCELLANEOUS)
SCISSORS LAP 5X35 DISP (ENDOMECHANICALS) ×1 IMPLANT
SEAL CANN UNIV 5-8 DVNC XI (MISCELLANEOUS) ×6 IMPLANT
SEAL XI 5MM-8MM UNIVERSAL (MISCELLANEOUS) ×4
SEALER VESSEL DA VINCI XI (MISCELLANEOUS) ×1
SEALER VESSEL EXT DVNC XI (MISCELLANEOUS) ×2 IMPLANT
SOLUTION ELECTROLUBE (MISCELLANEOUS) ×3 IMPLANT
STAPLER 60 DA VINCI SURE FORM (STAPLE)
STAPLER 60 SUREFORM DVNC (STAPLE) IMPLANT
STAPLER CANNULA SEAL DVNC XI (STAPLE) IMPLANT
STAPLER CANNULA SEAL XI (STAPLE)
STAPLER ECHELON POWER CIR 29 (STAPLE) IMPLANT
STAPLER ECHELON POWER CIR 31 (STAPLE) IMPLANT
STAPLER RELOAD 3.5X60 BLU DVNC (STAPLE)
STAPLER RELOAD 3.5X60 BLUE (STAPLE)
STAPLER RELOAD 4.3X60 GREEN (STAPLE) ×1
STAPLER RELOAD 4.3X60 GRN DVNC (STAPLE) ×2
STOPCOCK 4 WAY LG BORE MALE ST (IV SETS) ×6 IMPLANT
SUT ETHILON 2 0 PS N (SUTURE) IMPLANT
SUT NOVA NAB DX-16 0-1 5-0 T12 (SUTURE) ×6 IMPLANT
SUT PROLENE 2 0 KS (SUTURE) ×1 IMPLANT
SUT SILK 2 0 (SUTURE) ×1
SUT SILK 2 0 SH CR/8 (SUTURE) IMPLANT
SUT SILK 2-0 18XBRD TIE 12 (SUTURE) ×2 IMPLANT
SUT SILK 3 0 (SUTURE)
SUT SILK 3 0 SH CR/8 (SUTURE) ×3 IMPLANT
SUT SILK 3-0 18XBRD TIE 12 (SUTURE) IMPLANT
SUT V-LOC BARB 180 2/0GR6 GS22 (SUTURE)
SUT VIC AB 2-0 SH 18 (SUTURE) IMPLANT
SUT VIC AB 2-0 SH 27 (SUTURE)
SUT VIC AB 2-0 SH 27X BRD (SUTURE) IMPLANT
SUT VIC AB 3-0 SH 18 (SUTURE) IMPLANT
SUT VIC AB 4-0 PS2 27 (SUTURE) ×6 IMPLANT
SUT VICRYL 0 UR6 27IN ABS (SUTURE) ×3 IMPLANT
SUTURE V-LC BRB 180 2/0GR6GS22 (SUTURE) IMPLANT
SYR 10ML ECCENTRIC (SYRINGE) ×3 IMPLANT
SYS LAPSCP GELPORT 120MM (MISCELLANEOUS)
SYSTEM LAPSCP GELPORT 120MM (MISCELLANEOUS) IMPLANT
TOWEL OR 17X26 10 PK STRL BLUE (TOWEL DISPOSABLE) IMPLANT
TOWEL OR NON WOVEN STRL DISP B (DISPOSABLE) ×3 IMPLANT
TRAY FOLEY MTR SLVR 14FR STAT (SET/KITS/TRAYS/PACK) ×1 IMPLANT
TROCAR ADV FIXATION 5X100MM (TROCAR) ×3 IMPLANT
TUBING CONNECTING 10 (TUBING) ×6 IMPLANT
TUBING INSUFFLATION 10FT LAP (TUBING) ×3 IMPLANT

## 2020-09-22 NOTE — Op Note (Signed)
09/22/2020  11:33 AM  PATIENT:  Shirley Lowe  70 y.o. female  Patient Care Team: Cletis Athens, MD as PCP - General (Internal Medicine) [name], MD as Consulting Physician (Gastroenterology)  PRE-OPERATIVE DIAGNOSIS:  DIVERTICULAR DISEASE  POST-OPERATIVE DIAGNOSIS:  DIVERTICULAR DISEASE  PROCEDURE:  ROBOTIC ASSITED SIGMOIDECTOMY CYSTOSCOPY with FIREFLY INJECTION, INTRAOPERATIVE ASSESSMENT OF VASCULAR PERFUSION    Surgeon(s): Leighton Ruff, MD Ileana Roup, MD Lucas Mallow, MD  ASSISTANT: Dr Dema Severin   ANESTHESIA:   local and general  EBL: 67m Total I/O In: 1000 [I.V.:1000] Out: 250 [Urine:200; Blood:50]  Delay start of Pharmacological VTE agent (>24hrs) due to surgical blood loss or risk of bleeding:  no  DRAINS: none   SPECIMEN:  Source of Specimen:  Sigmoid colon  DISPOSITION OF SPECIMEN:  PATHOLOGY  COUNTS:  YES  PLAN OF CARE: Admit to inpatient   PATIENT DISPOSITION:  PACU - hemodynamically stable.  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  I recommended segmental resection:  The anatomy & physiology of the digestive tract was discussed.  The pathophysiology was discussed.  Natural history risks without surgery was discussed.   I worked to give an overview of the disease and the frequent need to have multispecialty involvement.  I feel the risks of no intervention will lead to serious problems that outweigh the operative risks; therefore, I recommended a partial colectomy to remove the pathology.  Laparoscopic & open techniques were discussed.   Risks such as bleeding, infection, abscess, leak, reoperation, possible ostomy, hernia, heart attack, death, and other risks were discussed.  I noted a good likelihood this will help address the problem.   Goals of post-operative recovery were discussed as well.    The patient expressed understanding & wished to proceed with surgery.  OR FINDINGS:    Patient had significant rectosigmoid disease  The anastomosis rests 11 cm from the anal verge by rigid proctoscopy.  DESCRIPTION:   Informed consent was confirmed.  The patient underwent general anaesthesia without difficulty.  The patient was positioned appropriately.  VTE prevention in place.  The patient's abdomen was clipped, prepped, & draped in a sterile fashion.  Surgical timeout confirmed our plan.  The patient was positioned in reverse Trendelenburg.  Abdominal entry was gained using a varies needle in the LUQ.  Entry was clean.  I induced carbon dioxide insufflation.  An 84mrobotic port was placed in the RUQ.  Camera inspection revealed no injury.  Extra ports were carefully placed under direct laparoscopic visualization.  I laparoscopically reflected the greater omentum and the upper abdomen the small bowel in the upper abdomen. The patient was appropriately positioned and the robot was docked to the patient's left side.  Instruments were placed under direct visualization.    I mobilized the sigmoid colon off of the pelvic sidewall. There were dense adhesions to the left pelvic floor.  The left ovary and tube was behind the sigmoid colon. Once this was freed, I scored the base of peritoneum of the right side of the mesentery of the left colon from the ligament of Treitz to the peritoneal reflection of the mid rectum.  The patient had dense left pelvic sidewall adhesions.  I elevated the sigmoid mesentery and enetered into the retro-mesenteric plane. We were able to identify the left ureter and gonadal vessels. We kept those posterior within the retroperitoneum and elevated the left colon mesentery off that. I did isolated IMA pedicle but did  not ligate it yet.  I continued distally and got into the avascular plane posterior to the mesorectum. This allowed me to help mobilize the rectum as well by freeing the mesorectum off the sacrum.  I mobilized the peritoneal coverings towards the  peritoneal reflection on both the right and left sides of the rectum.  I could see the right and left ureters and stayed away from them.    I skeletonized the inferior mesenteric artery pedicle. After confirming the left ureter was out of the way, I went ahead and ligated the inferior mesenteric artery pedicle with bipolar robotic vessel sealer well above its takeoff from the aorta.   We ensured hemostasis. I skeletonized the mesorectum at the junction at the proximal rectum using blunt dissection & bipolar robotic vessel sealer.  I mobilized the left colon in a lateral to medial fashion off the line of Toldt up towards the splenic flexure to ensure good mobilization of the left colon to reach into the pelvis.  I divided the mesentery to the sigmoid descending colon junction using the vessel sealer.  I then divided the rectosigmoid junction using a green load 60 mm robotic stapler.  We then injected 3 mL of firefly intravenously to assess intraoperatively for perfusion.  The dye filled the colon and a normal pattern consistent with good perfusion to the level of dissection.  The rectum was also well perfused.  At this point the robot was undocked and the abdomen was desufflated.  The 12 mm suprapubic port was enlarged into a Pfannenstiel incision.  An [place] wound protector was placed.  The colon was brought out of the wound and divided at the previously identified junction over a pursestring device.  A 2-0 Prolene pursestring was placed and secured with interrupted 3-0 silk sutures.  A 29 mm EEA anvil was placed into the colon and the pursestring was tied tightly around this.  This was then placed back into the abdomen and the cap was placed on the wound protector.  The abdomen was reinsufflated.  The EEA stapler was inserted into the distal rectum after gentle dilation with the rectal dilators.  An anastomosis was created without difficulty.  There was no tension on the anastomosis.  There was no leak when  tested with insufflation under irrigation.  Hemostasis was good within the rectum.  The anastomosis rest approximately 11 cm from the anal verge.  At this point we switched to clean gowns, gloves, instruments and drapes.  The Pfannenstiel peritoneum was closed using a running 0 Vicryl suture.  The fascia was reapproximated using interrupted #1 Novafil sutures.  The subcutaneous tissue was reapproximated using a running 2-0 Vicryl suture and the skin was closed using a running 4-0 Vicryl suture.  A sterile dressing was applied.  The remaining port sites were closed using interrupted 4-0 Vicryl sutures and Dermabond.  The patient was then awakened from anesthesia and sent to the postanesthesia care unit in stable condition.  All counts were correct per operating room staff.  An MD assistant was necessary for tissue manipulation, retraction and positioning due to the complexity of the case and hospital policies

## 2020-09-22 NOTE — H&P (Signed)
H&P Physician requesting consult: Leighton Ruff  Chief Complaint: Diverticulitis  History of Present Illness: 70 year old female with a history of ulcerative colitis with recurrent episodes of complicated diverticulitis presents for sigmoidectomy.  Intraoperative firefly instillation into the ureters is requested.  Past Medical History:  Diagnosis Date  . Diverticulitis   . Diverticulitis   . Dyspnea    with exerton due to out of shape per pt   . Osteoporosis   . Ulcerative colitis    Past Surgical History:  Procedure Laterality Date  . ABDOMINAL HYSTERECTOMY    . COLONOSCOPY WITH PROPOFOL N/A 11/12/2019   Procedure: COLONOSCOPY WITH PROPOFOL;  Surgeon: Virgel Manifold, MD;  Location: [facility] ENDOSCOPY;  Service: Endoscopy;  Laterality: N/A;  . INCONTINENCE SURGERY    . TONSILLECTOMY    . TONSILLECTOMY      Home Medications:  Medications Prior to Admission  Medication Sig Dispense Refill Last Dose  . ascorbic acid (VITAMIN C) 1000 MG tablet Take 1,000 mg by mouth daily.   Past Week at Unknown time  . B Complex Vitamins (VITAMIN B-COMPLEX) TABS Take 1 tablet by mouth daily.   Past Week at Unknown time  . calcium carbonate (OSCAL) 1500 (600 Ca) MG TABS tablet Take 600 mg of elemental calcium by mouth daily.   Past Week at Unknown time  . Cholecalciferol (VITAMIN D) 50 MCG (2000 UT) CAPS Take 1 capsule (2,000 Units total) by mouth daily. 30 capsule 0 Past Week at Unknown time  . Lactobacillus Rhamnosus, GG, (RA PROBIOTIC DIGESTIVE CARE) CAPS Take 1 capsule by mouth daily.   Past Week at Unknown time  . Omega-3 Fatty Acids (FISH OIL) 1000 MG CAPS Take 1,000 mg by mouth daily.   Past Week at Unknown time  . vitamin E 100 UNIT capsule Take 100 Units by mouth daily.   Past Week at Unknown time  . zinc gluconate 50 MG tablet Take 50 mg by mouth daily.   Past Week at Unknown time  . mesalamine (LIALDA) 1.2 g EC tablet TAKE 1 TABLET (1.2 G TOTAL) BY MOUTH IN THE MORNING AND AT BEDTIME.  (Patient not taking: Reported on 09/08/2020) 180 tablet 1 Not Taking at Unknown time   Allergies:  Allergies  Allergen Reactions  . Penicillins     Has yeast infection    Family History  Problem Relation Age of Onset  . Cancer Mother        breast cancer  . Breast cancer Mother 92       metastic  . Cancer Father        pancreatic  . Hepatitis C Sister   . Thyroid disease Sister   . Irritable bowel syndrome Sister   . Allergies Sister    Social History:  reports that she has never smoked. She has never used smokeless tobacco. She reports that she does not drink alcohol and does not use drugs.  ROS: A complete review of systems was performed.  All systems are negative except for pertinent findings as noted. ROS   Physical Exam:  Vital signs in last 24 hours: Temp:  [97.7 F (36.5 C)] 97.7 F (36.5 C) (05/25 0712) Pulse Rate:  [67] 67 (05/25 0712) Resp:  [18] 18 (05/25 0712) BP: (114)/(61) 114/61 (05/25 0712) SpO2:  [100 %] 100 % (05/25 0712) Weight:  [70.8 kg] 70.8 kg (05/25 0703) General:  Alert and oriented, No acute distress HEENT: Normocephalic, atraumatic Neck: No JVD or lymphadenopathy Cardiovascular: Regular rate and rhythm Lungs: Regular  rate and effort Abdomen: Soft, nontender, nondistended, no abdominal masses Back: No CVA tenderness Extremities: No edema Neurologic: Grossly intact  Laboratory Data:  Results for orders placed or performed during the hospital encounter of 09/22/20 (from the past 24 hour(s))  ABO/Rh     Status: None   Collection Time: 09/22/20  7:00 AM  Result Value Ref Range   ABO/RH(D)      B POS Performed at [facility], [place] [address] [place], [place] [address]    Recent Results (from the past 240 hour(s))  SARS CORONAVIRUS 2 (TAT 6-24 HRS) Nasopharyngeal Nasopharyngeal Swab     Status: None   Collection Time: 09/20/20  1:22 PM   Specimen: Nasopharyngeal Swab  Result Value Ref Range Status   SARS  Coronavirus 2 NEGATIVE NEGATIVE Final    Comment: (NOTE) SARS-CoV-2 target nucleic acids are NOT DETECTED.  The SARS-CoV-2 RNA is generally detectable in upper and lower respiratory specimens during the acute phase of infection. Negative results do not preclude SARS-CoV-2 infection, do not rule out co-infections with other pathogens, and should not be used as the sole basis for treatment or other patient management decisions. Negative results must be combined with clinical observations, patient history, and epidemiological information. The expected result is Negative.  Fact Sheet for Patients: SugarRoll.be  Fact Sheet for Healthcare Providers: https://www.woods-mathews.com/  This test is not yet approved or cleared by the [place] FDA and  has been authorized for detection and/or diagnosis of SARS-CoV-2 by FDA under an Emergency Use Authorization (EUA). This EUA will remain  in effect (meaning this test can be used) for the duration of the COVID-19 declaration under Se ction 564(b)(1) of the Act, 21 U.S.C. section 360bbb-3(b)(1), unless the authorization is terminated or revoked sooner.  Performed at [facility], [place] [address], [place], [place] [address]    Creatinine: No results for input(s): CREATININE in the last 168 hours.  Impression/Assessment:  Diverticulitis  Plan:  Proceed with cystoscopy with bilateral firefly instillation into the ureters.  Marton Redwood, III 09/22/2020, 8:35 AM

## 2020-09-22 NOTE — Transfer of Care (Signed)
Immediate Anesthesia Transfer of Care Note  Patient: Shirley Lowe  Procedure(s) Performed: ROBOTIC ASSITED SIGMOIDECTOMY (N/A Abdomen) CYSTOSCOPY with FIREFLY INJECTION (N/A Ureter)  Patient Location: PACU  Anesthesia Type:General  Level of Consciousness: awake, alert  and oriented  Airway & Oxygen Therapy: Patient Spontanous Breathing and Patient connected to face mask  Post-op Assessment: Report given to RN and Post -op Vital signs reviewed and stable  Post vital signs: Reviewed and stable  Last Vitals:  Vitals Value Taken Time  BP 118/59 09/22/20 1146  Temp    Pulse 63 09/22/20 1155  Resp 10 09/22/20 1155  SpO2 100 % 09/22/20 1155  Vitals shown include unvalidated device data.  Last Pain:  Vitals:   09/22/20 6438  TempSrc: Oral  PainSc:       Patients Stated Pain Goal: 4 (38/18/40 3754)  Complications: No complications documented.

## 2020-09-22 NOTE — Anesthesia Procedure Notes (Signed)
Procedure Name: Intubation Performed by: Rosaland Lao, CRNA Pre-anesthesia Checklist: Patient identified, Emergency Drugs available, Suction available and Patient being monitored Patient Re-evaluated:Patient Re-evaluated prior to induction Oxygen Delivery Method: Circle system utilized Preoxygenation: Pre-oxygenation with 100% oxygen Induction Type: IV induction and Cricoid Pressure applied Ventilation: Mask ventilation without difficulty Laryngoscope Size: Miller and 2 Grade View: Grade I Tube type: Oral Tube size: 7.0 mm Number of attempts: 1 Airway Equipment and Method: Stylet and Oral airway Placement Confirmation: ETT inserted through vocal cords under direct vision,  positive ETCO2 and breath sounds checked- equal and bilateral Secured at: 21 cm Tube secured with: Tape Dental Injury: Teeth and Oropharynx as per pre-operative assessment

## 2020-09-22 NOTE — Anesthesia Postprocedure Evaluation (Signed)
Anesthesia Post Note  Patient: Shirley Lowe  Procedure(s) Performed: ROBOTIC ASSITED SIGMOIDECTOMY (N/A Abdomen) CYSTOSCOPY with FIREFLY INJECTION (N/A Ureter)     Patient location during evaluation: PACU Anesthesia Type: General Level of consciousness: awake and alert, patient cooperative and oriented Pain management: pain level controlled Vital Signs Assessment: post-procedure vital signs reviewed and stable Respiratory status: spontaneous breathing, nonlabored ventilation and respiratory function stable Cardiovascular status: blood pressure returned to baseline and stable Postop Assessment: no apparent nausea or vomiting Anesthetic complications: no   No complications documented.  Last Vitals:  Vitals:   09/22/20 1445 09/22/20 1500  BP: (!) 127/59 (!) 118/56  Pulse: 61 63  Resp: 13 10  Temp:    SpO2: 100% 100%    Last Pain:  Vitals:   09/22/20 1500  TempSrc:   PainSc: 0-No pain                 Akelia Husted,E. Lupe Handley

## 2020-09-22 NOTE — H&P (Signed)
The patient is a 70 year old Lowe who presents with diverticulitis. 70 year old Lowe with a remote history of ulcerative colitis requiring Asacol only presents to the office today to be evaluated for diverticular disease. Over the past year she has had several episodes of complicated diverticulitis. These have resolved with antibiotics. She underwent a colonoscopy late last year, which showed a torturous colon with some narrowing in the sigmoid colon. There was no sign of active inflammation or malignancy. At that time, she was on a non-therapeutic dose of mesalamine and she was told that she could stop this. Since the episode of diverticulitis in January. She has had some episodes of crampy abdominal pain that resolved rather quickly. She is having regular bowel movements. Past surgical history consist of hysterectomy and bladder sling. Patient has no major medical problems.   Past Surgical History Mammie Lorenzo, LPN; 8/32/5498 2:64 AM) Colon Polyp Removal - Colonoscopy Hysterectomy (not due to cancer) - Partial Tonsillectomy  Diagnostic Studies History Mammie Lorenzo, LPN; 1/Shirley/3094 0:76 AM) Colonoscopy within last year Mammogram 1-3 years ago Pap Smear 1-5 years ago  Allergies Mammie Lorenzo, LPN; 12/06/8108 3:15 AM) Penicillins yeast infections Allergies Reconciled  Medication History Mammie Lorenzo, LPN; 9/45/8592 9:24 AM) Mesalamine (1.2GM Tablet DR, Oral) Active. Vitamin B Complex (Oral) Active. Vitamin C (500MG Capsule, Oral) Active. Vitamin E (400UNIT Tablet, Oral) Active. Fish Oil (1000MG Capsule, Oral) Active. Oscal 500/200 D-3 (500-200MG-UNIT Tablet, Oral) Active. Zinc Gluconate (50MG Tablet, Oral) Active. Probiotic (Oral) Active. Medications Reconciled  Social History Mammie Lorenzo, LPN; 4/62/8638 1:77 AM) No alcohol use No caffeine use No drug use Tobacco use Never smoker.  Family History Mammie Lorenzo, LPN; 05/16/5788  3:83 AM) Breast Cancer Mother. Ischemic Bowel Disease Sister. Malignant Neoplasm Of Pancreas Father. Seizure disorder Sister. Thyroid problems Sister.  Pregnancy / Birth History Mammie Lorenzo, LPN; 3/38/3291 9:16 AM) Age at menarche 67 years. Age of menopause 69-55 Gravida 3 Length (months) of breastfeeding 7-12 Maternal age 1-30 Para 3  Other Problems Mammie Lorenzo, LPN; 10/05/43 9:97 AM) Bladder Problems Diverticulosis Other disease, cancer, significant illness Ulcerative Colitis     Review of Systems  General Present- Weight Loss. Not Present- Appetite Loss, Chills, Fatigue, Fever, Night Sweats and Weight Gain. Skin Not Present- Change in Wart/Mole, Dryness, Hives, Jaundice, New Lesions, Non-Healing Wounds, Rash and Ulcer. HEENT Present- Wears glasses/contact lenses. Not Present- Earache, Hearing Loss, Hoarseness, Nose Bleed, Oral Ulcers, Ringing in the Ears, Seasonal Allergies, Sinus Pain, Sore Throat, Visual Disturbances and Yellow Eyes. Respiratory Not Present- Bloody sputum, Chronic Cough, Difficulty Breathing, Snoring and Wheezing. Breast Not Present- Breast Mass, Breast Pain, Nipple Discharge and Skin Changes. Cardiovascular Not Present- Chest Pain, Difficulty Breathing Lying Down, Leg Cramps, Palpitations, Rapid Heart Rate, Shortness of Breath and Swelling of Extremities. Gastrointestinal Not Present- Abdominal Pain, Bloating, Bloody Stool, Change in Bowel Habits, Chronic diarrhea, Constipation, Difficulty Swallowing, Excessive gas, Gets full quickly at meals, Hemorrhoids, Indigestion, Nausea, Rectal Pain and Vomiting. Lowe Genitourinary Present- Frequency and Urgency. Not Present- Nocturia, Painful Urination and Pelvic Pain. Musculoskeletal Present- Back Pain. Not Present- Joint Pain, Joint Stiffness, Muscle Pain, Muscle Weakness and Swelling of Extremities. Neurological Not Present- Decreased Memory, Fainting, Headaches, Numbness, Seizures,  Tingling, Tremor, Trouble walking and Weakness. Psychiatric Not Present- Anxiety, Bipolar, Change in Sleep Pattern, Depression, Fearful and Frequent crying. Endocrine Not Present- Cold Intolerance, Excessive Hunger, Hair Changes, Heat Intolerance, Hot flashes and New Diabetes. Hematology Not Present- Blood Thinners, Easy Bruising, Excessive bleeding, Gland problems, HIV and Persistent Infections.  BP 114/61   Pulse 67   Temp 97.7 F (36.5 C) (Oral)   Resp 18   Ht 5' 4.5" (1.638 m)   Wt 70.8 kg   SpO2 100%   BMI 26.36 kg/m    Physical Exam   General Mental Status-Alert. General Appearance-Cooperative. CV: RRR Lungs: cta Abdomen Palpation/Percussion Palpation and Percussion of the abdomen reveal - Soft and Non Tender.    Assessment & Plan   DIVERTICULITIS, COLON (K57.32) Impression: 70 year old Lowe who presents to the office for evaluation of complicated diverticulitis. Her last episode was in late January 2022. We will plan on performing a resection in late April or beginning of May. She does have a history of ulcerative colitis but this appears to be in remission off any therapeutic medications with a normal endoscopic evaluation of mucosa. We discussed that if she does indeed have active ulcerative colitis, she is at increased risk for anastomotic breakdown and leak. I think this risk is small but slightly higher than the average population. We discussed the alternative, which would be to do a total abdominal colectomy and ileorectal anastomosis. In my opinion, this would carry more risk and she agrees to proceed with sigmoidectomy. Last CT scan was reviewed and inflammation does appear to be near the left ureter. We will plan on getting a cystoscopy and firefighter injection prior to surgery. We will then plan on performing a robotic partial colectomy. The surgery and anatomy were described to the patient as well as the risks of surgery and the possible  complications. These include: Bleeding, deep abdominal infections and possible wound complications such as hernia and infection, damage to adjacent structures, leak of surgical connections, which can lead to other surgeries and possibly an ostomy, possible need for other procedures, such as abscess drains in radiology, possible prolonged hospital stay, possible diarrhea from removal of part of the colon, possible constipation from narcotics, possible bowel, bladder or sexual dysfunction if having rectal surgery, prolonged fatigue/weakness or appetite loss, possible early recurrence of of disease, possible complications of their medical problems such as heart disease or arrhythmias or lung problems, death (less than 1%). I believe the patient understands and wishes to proceed with the surgery.

## 2020-09-22 NOTE — Op Note (Signed)
Operative Note  Preoperative diagnosis:  1.  Recurrent diverticulitis  Postoperative diagnosis: 1.  Recurrent diverticulitis  Procedure(s): 1.  Cystoscopy with bilateral ureteral catheterization and instillation of firefly  Surgeon: Link Snuffer, MD  Assistants: None  Anesthesia: General  Complications: None immediate  EBL: Minimal  Specimens: 1.  None  Drains/Catheters: 1.  Foley catheter  Intraoperative findings: Normal urethra and bladder.  Indication: 70 year old female with history of ulcerative colitis and recurrent diverticulitis presents for sigmoid colectomy.  Intraoperative firefly instillation was requested.  Description of procedure:  The patient was identified and consent was obtained.  The patient was taken to the operating room and placed in the supine position.  The patient was placed under general anesthesia.  Perioperative antibiotics were administered.  The patient was placed in dorsal lithotomy.  Patient was prepped and draped in a standard sterile fashion and a timeout was performed.  A 21 French rigid cystoscope was advanced into the urethra and into the bladder.  Complete cystoscopy was performed with no abnormal findings.  The left ureter was cannulated with an open-ended ureteral catheter and 7.5 cc of firefly was instilled.  The right ureter was then cannulated with an open-ended ureteral catheter and another 7.5 cc of firefly was instilled.  I withdrew the scope and placed a Foley catheter.  This cleared the operation.  Patient tolerated the procedure well and was stable postoperative.  Plan: As per general surgery.

## 2020-09-23 ENCOUNTER — Other Ambulatory Visit (HOSPITAL_COMMUNITY): Payer: Self-pay

## 2020-09-23 LAB — BASIC METABOLIC PANEL
Anion gap: 6 (ref 5–15)
BUN: 7 mg/dL — ABNORMAL LOW (ref 8–23)
CO2: 26 mmol/L (ref 22–32)
Calcium: 8.6 mg/dL — ABNORMAL LOW (ref 8.9–10.3)
Chloride: 107 mmol/L (ref 98–111)
Creatinine, Ser: 0.49 mg/dL (ref 0.44–1.00)
GFR, Estimated: >60 mL/min (ref >60)
Glucose, Bld: 134 mg/dL — ABNORMAL HIGH (ref 70–99)
Potassium: 3.9 mmol/L (ref 3.5–5.1)
Sodium: 139 mmol/L (ref 135–145)

## 2020-09-23 LAB — CBC
HCT: 36 % (ref 36.0–46.0)
Hemoglobin: 11.7 g/dL — ABNORMAL LOW (ref 12.0–15.0)
MCH: 29.1 pg (ref 26.0–34.0)
MCHC: 32.5 g/dL (ref 30.0–36.0)
MCV: 89.6 fL (ref 80.0–100.0)
Platelets: 194 10*3/uL (ref 150–400)
RBC: 4.02 MIL/uL (ref 3.87–5.11)
RDW: 13.4 % (ref 11.5–15.5)
WBC: 11.2 10*3/uL — ABNORMAL HIGH (ref 4.0–10.5)
nRBC: 0 % (ref 0.0–0.2)

## 2020-09-23 NOTE — Progress Notes (Signed)
1 Day Post-Op robotic sigmoidectomy Subjective: Doing well.  Pain controlled, tolerating liquids.  Having bowel function  Objective: Vital signs in last 24 hours: Temp:  [96.7 F (35.9 C)-98.2 F (36.8 C)] 97.5 F (36.4 C) (05/26 2263) Pulse Rate:  [54-71] 61 (05/26 0608) Resp:  [10-24] 18 (05/26 0608) BP: (101-139)/(46-88) 107/54 (05/26 0608) SpO2:  [99 %-100 %] 99 % (05/26 0608)   Intake/Output from previous day: 05/25 0701 - 05/26 0700 In: 5172.5 [P.O.:1060; I.V.:4112.5] Out: 5251 [Urine:5200; Stool:1; Blood:50] Intake/Output this shift: Total I/O In: -  Out: 2 [Emesis/NG output:1; Stool:1]   General appearance: alert and cooperative GI: normal findings: soft, non-tender, non-distended  Incision: no significant drainage  Lab Results:  Recent Labs    09/23/20 0421  WBC 11.2*  HGB 11.7*  HCT 36.0  PLT 194   BMET Recent Labs    09/23/20 0421  NA 139  K 3.9  CL 107  CO2 26  GLUCOSE 134*  BUN 7*  CREATININE 0.49  CALCIUM 8.6*   PT/INR No results for input(s): LABPROT, INR in the last 72 hours. ABG No results for input(s): PHART, HCO3 in the last 72 hours.  Invalid input(s): PCO2, PO2  MEDS, Scheduled . acetaminophen  1,000 mg Oral Q6H  . alvimopan  12 mg Oral BID  . enoxaparin (LOVENOX) injection  40 mg Subcutaneous Q24H  . feeding supplement  237 mL Oral BID BM  . saccharomyces boulardii  250 mg Oral BID    Studies/Results: No results found.  Assessment: s/p Procedure(s): ROBOTIC ASSITED SIGMOIDECTOMY CYSTOSCOPY with FIREFLY INJECTION Patient Active Problem List   Diagnosis Date Noted  . Diverticular disease 09/22/2020  . Overweight (BMI 25.0-29.9) 05/11/2020  . Acute diverticulitis 05/10/2020  . History of ulcerative colitis   . Low vitamin D level 03/10/2019  . Osteoporosis 07/17/2017  . Lipodermatosclerosis 07/17/2017  . Urinary incontinence in female 07/17/2017  . 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

## 2020-09-23 NOTE — Progress Notes (Signed)
[place] visited pt. briefly while rounding on 3E; pt. sitting in chair at bedside; she shared that she is recovering from surgery to remove part of her colon and is grateful that medical team did not have to remove as much as was originally projected.  Pt. shares she has not experienced much pain in recovery and that she is optimistic about her prognosis.  Pt. credits 'God's grace' in reflecting on these blessings in the midst of her hopsitalization.  No further needs at this time, but pt. grateful for visit.

## 2020-09-24 ENCOUNTER — Other Ambulatory Visit (HOSPITAL_COMMUNITY): Payer: Self-pay

## 2020-09-24 LAB — BASIC METABOLIC PANEL
Anion gap: 8 (ref 5–15)
BUN: 14 mg/dL (ref 8–23)
CO2: 25 mmol/L (ref 22–32)
Calcium: 8.5 mg/dL — ABNORMAL LOW (ref 8.9–10.3)
Chloride: 107 mmol/L (ref 98–111)
Creatinine, Ser: 0.57 mg/dL (ref 0.44–1.00)
GFR, Estimated: >60 mL/min (ref >60)
Glucose, Bld: 80 mg/dL (ref 70–99)
Potassium: 3.9 mmol/L (ref 3.5–5.1)
Sodium: 140 mmol/L (ref 135–145)

## 2020-09-24 LAB — CBC
HCT: 36.1 % (ref 36.0–46.0)
Hemoglobin: 11.7 g/dL — ABNORMAL LOW (ref 12.0–15.0)
MCH: 29.7 pg (ref 26.0–34.0)
MCHC: 32.4 g/dL (ref 30.0–36.0)
MCV: 91.6 fL (ref 80.0–100.0)
Platelets: 189 10*3/uL (ref 150–400)
RBC: 3.94 MIL/uL (ref 3.87–5.11)
RDW: 13.7 % (ref 11.5–15.5)
WBC: 9.4 10*3/uL (ref 4.0–10.5)
nRBC: 0 % (ref 0.0–0.2)

## 2020-09-24 LAB — SURGICAL PATHOLOGY

## 2020-09-24 MED ORDER — ACETAMINOPHEN 500 MG PO TABS: 1000.0000 mg | ORAL_TABLET | Freq: Four times a day (QID) | ORAL | 0 refills | Status: AC

## 2020-09-24 NOTE — Discharge Instructions (Signed)

## 2020-09-24 NOTE — Progress Notes (Signed)
Pt discharging this morning - alerted by RN of need for 3n1 commode.  Have placed referral with [employer] who will deliver to pt's room.  No further TOC needs.  Maikol Grassia, LCSW

## 2020-09-24 NOTE — Discharge Summary (Signed)
Physician Discharge Summary  Patient ID: Shirley Lowe MRN: [id number] DOB/AGE: 71-Apr-1952 71 y.o.  Admit date: 09/22/2020 Discharge date: 09/24/2020  Admission Diagnoses: Diverticular disease  Discharge Diagnoses:  Active Problems:   Diverticular disease   Discharged Condition: good  Hospital Course: Pt admitted after surgery.  Diet advanced as tolerated.  By POD 2 she was tolerating a soft diet and having bowel function.  She was ambulating without assistance and pain was controlled with Tylenol.  Consults: None  Significant Diagnostic Studies: labs: cbc, bmet  Treatments: IV hydration, analgesia: acetaminophen and surgery: robotic sigmoidectomy  Discharge Exam: Blood pressure (!) 106/51, pulse 74, temperature 98 F (36.7 C), temperature source Oral, resp. rate 16, height 5' 4.5" (1.638 m), weight 72.5 kg, SpO2 94 %. General appearance: alert and cooperative GI: normal findings: soft, non-tender Incision/Wound: clean, dry, intact  Disposition: Discharge disposition: 01-Home or Self Care        Allergies as of 09/24/2020      Reactions   Penicillins    Has yeast infection   Cefotetan Rash      Medication List    TAKE these medications   acetaminophen 500 MG tablet Commonly known as: TYLENOL Take 2 tablets (1,000 mg total) by mouth every 6 (six) hours.   ascorbic acid 1000 MG tablet Commonly known as: VITAMIN C Take 1,000 mg by mouth daily.   calcium carbonate 1500 (600 Ca) MG Tabs tablet Commonly known as: OSCAL Take 600 mg of elemental calcium by mouth daily.   Fish Oil 1000 MG Caps Take 1,000 mg by mouth daily.   mesalamine 1.2 g EC tablet Commonly known as: LIALDA TAKE 1 TABLET (1.2 G TOTAL) BY MOUTH IN THE MORNING AND AT BEDTIME.   RA Probiotic Digestive Care Caps Take 1 capsule by mouth daily.   Vitamin B-Complex Tabs Take 1 tablet by mouth daily.   Vitamin D 50 MCG (2000 UT) Caps Take 1 capsule (2,000 Units total) by mouth daily.    vitamin E 45 MG (100 UNITS) capsule Take 100 Units by mouth daily.   zinc gluconate 50 MG tablet Take 50 mg by mouth daily.            Durable Medical Equipment  (From admission, onward)         Start     Ordered   09/24/20 0743  For home use only DME Bedside commode  Once       Question:  Patient needs a bedside commode to treat with the following condition  Answer:  Diverticular disease   09/24/20 0743          Follow-up Information    Leighton Ruff, MD. Schedule an appointment as soon as possible for a visit in 2 week(s).   Specialties: General Surgery, Colon and Rectal Surgery Contact information: [address] [place] [address] [phone]               Signed: Rosario Adie 0/17/4944, 8:12 AM

## 2021-01-06 ENCOUNTER — Other Ambulatory Visit: Payer: Self-pay

## 2021-01-06 ENCOUNTER — Telehealth: Payer: Self-pay

## 2021-01-06 MED ORDER — PAXLOVID (300/100) 20 X 150 MG & 10 X 100MG PO TBPK
3.0000 | ORAL_TABLET | Freq: Two times a day (BID) | ORAL | 0 refills | Status: DC
  Filled 2021-01-06: qty 30, 5d supply, fill #0

## 2021-01-06 NOTE — Telephone Encounter (Signed)
Outpatient Pharmacy Oral COVID Treatment Note  I connected with Shirley Lowe on 01/06/2021/4:48 PM by telephone and verified that I am speaking with the correct person using two identifiers.  I discussed the limitations, risks, security, and privacy concerns of performing an evaluation and management service by telephone and the availability of in person appointments via referral to a physician. The patient expressed understanding and agreed to proceed.  Pharmacy location: [facility]  Diagnosis: COVID-19 infection  Purpose of visit: Discussion of potential use of Paxlovid, a new treatment for mild to moderate COVID-19 viral infection in non-hospitalized patients.  Subjective/Objective: Patient is a 70 y.o. female who is presenting with COVID 19 viral infection.  COVID 19 viral infection. Their symptoms began on 01/04/21 with cold symptoms.  The patient has confirmed COVID-19 via a home test on 01/05/21.   Past Medical History:  Diagnosis Date   Diverticulitis    Diverticulitis    Dyspnea    with exerton due to out of shape per pt    Osteoporosis    Ulcerative colitis      Allergies  Allergen Reactions   Penicillins     Has yeast infection   Cefotetan Rash     Current Outpatient Medications:    acetaminophen (TYLENOL) 500 MG tablet, Take 2 tablets (1,000 mg total) by mouth every 6 (six) hours., Disp: 30 tablet, Rfl: 0   ascorbic acid (VITAMIN C) 1000 MG tablet, Take 1,000 mg by mouth daily., Disp: , Rfl:    B Complex Vitamins (VITAMIN B-COMPLEX) TABS, Take 1 tablet by mouth daily., Disp: , Rfl:    calcium carbonate (OSCAL) 1500 (600 Ca) MG TABS tablet, Take 600 mg of elemental calcium by mouth daily., Disp: , Rfl:    Cholecalciferol (VITAMIN D) 50 MCG (2000 UT) CAPS, Take 1 capsule (2,000 Units total) by mouth daily., Disp: 30 capsule, Rfl: 0   Lactobacillus Rhamnosus, GG, (RA PROBIOTIC DIGESTIVE CARE) CAPS, Take 1 capsule by mouth daily., Disp: , Rfl:     mesalamine (LIALDA) 1.2 g EC tablet, TAKE 1 TABLET (1.2 G TOTAL) BY MOUTH IN THE MORNING AND AT BEDTIME. (Patient not taking: Reported on 09/08/2020), Disp: 180 tablet, Rfl: 1   nirmatrelvir & ritonavir (PAXLOVID, 300/100,) 20 x 150 MG & 10 x 100MG TBPK, Take 3 tablets (2 nirmatrelvir and 1 ritonavir) by mouth 2 (two) times daily for 5 days., Disp: 30 tablet, Rfl: 0   Omega-3 Fatty Acids (FISH OIL) 1000 MG CAPS, Take 1,000 mg by mouth daily., Disp: , Rfl:    vitamin E 100 UNIT capsule, Take 100 Units by mouth daily., Disp: , Rfl:    zinc gluconate 50 MG tablet, Take 50 mg by mouth daily., Disp: , Rfl:   Lab Monitoring: eGFR 09/24/20 >60  Drug Interactions Noted: none  Plan:  This patient is a 70 y.o. female that meets the criteria for Emergency Use Authorization of Paxlovid. After reviewing the emergency use authorization with the patient, the patient agrees to receive Paxlovid.  Through FDA guidance and current  standing order Paxlovid will be prescribed to the patient.   Patient contacted for counseling on 01/05/21 and verbalized understanding.   Delivery or Pick-Up Date: 01/05/21  Follow up instructions:    Take prescription BID x 5 days as directed Counseling was provided by pharmacist. Reach out to pharmacist with follow up questions For concerns regarding further COVID symptoms please follow up with your PCP or urgent care For urgent or life-threatening issues,   seek care at your local emergency department   [place] 01/06/2021, 4:48 PM [place] Pharmacist Phone# [phone]

## 2021-02-21 ENCOUNTER — Ambulatory Visit: Payer: Medicare HMO | Admitting: Adult Health

## 2021-03-15 ENCOUNTER — Ambulatory Visit: Payer: Medicare HMO | Admitting: Adult Health

## 2021-04-06 ENCOUNTER — Ambulatory Visit: Payer: Medicare HMO | Admitting: Internal Medicine

## 2021-04-28 ENCOUNTER — Other Ambulatory Visit: Payer: Self-pay | Admitting: Gastroenterology

## 2021-04-28 DIAGNOSIS — K51311 Ulcerative (chronic) rectosigmoiditis with rectal bleeding: Secondary | ICD-10-CM

## 2021-05-05 ENCOUNTER — Ambulatory Visit (INDEPENDENT_AMBULATORY_CARE_PROVIDER_SITE_OTHER): Payer: Medicare HMO | Admitting: *Deleted

## 2021-05-05 DIAGNOSIS — Z0000 Encounter for general adult medical examination without abnormal findings: Secondary | ICD-10-CM

## 2021-05-05 NOTE — Progress Notes (Signed)
Subjective:   Shirley Lowe is a 71 y.o. female who presents for Medicare Annual (Subsequent) preventive examination. I discussed the limitations of evaluation and management by telemedicine and the availability of in person appointments. Patient expressed understanding and agreed to proceed.   Visit performed using audio  Patient:home Provider:home   Review of Systems    Defer to provider  Cardiac Risk Factors include: none     Objective:    There were no vitals filed for this visit. There is no height or weight on file to calculate BMI.  Advanced Directives 05/05/2021 09/22/2020 09/13/2020 05/10/2020 11/27/2019 11/26/2019 11/12/2019  Does Patient Have a Medical Advance Directive? No Yes Yes No Yes Yes Yes  Type of Advance Directive - [place];Living will [place];Living will - [place];Living will Living will;Healthcare Power of [place];Living will  Does patient want to make changes to medical advance directive? - No - Patient declined - - No - Patient declined No - Patient declined -  Copy of [place] in Chart? - - - - No - copy requested No - copy requested -  Would patient like information on creating a medical advance directive? No - Patient declined - - No - Patient declined No - Patient declined - -    Current Medications (verified) Outpatient Encounter Medications as of 05/05/2021  Medication Sig   acetaminophen (TYLENOL) 500 MG tablet Take 2 tablets (1,000 mg total) by mouth every 6 (six) hours.   ascorbic acid (VITAMIN C) 1000 MG tablet Take 1,000 mg by mouth daily.   B Complex Vitamins (VITAMIN B-COMPLEX) TABS Take 1 tablet by mouth daily.   calcium carbonate (OSCAL) 1500 (600 Ca) MG TABS tablet Take 600 mg of elemental calcium by mouth daily.   Cholecalciferol (VITAMIN D) 50 MCG (2000 UT) CAPS Take 1 capsule (2,000 Units total) by mouth daily.   Lactobacillus  Rhamnosus, GG, (RA PROBIOTIC DIGESTIVE CARE) CAPS Take 1 capsule by mouth daily.   mesalamine (LIALDA) 1.2 g EC tablet TAKE 1 TABLET (1.2 G TOTAL) BY MOUTH IN THE MORNING AND AT BEDTIME.   nirmatrelvir & ritonavir (PAXLOVID, 300/100,) 20 x 150 MG & 10 x 100MG  TBPK Take 3 tablets (2 nirmatrelvir and 1 ritonavir) by mouth 2 (two) times daily for 5 days.   Omega-3 Fatty Acids (FISH OIL) 1000 MG CAPS Take 1,000 mg by mouth daily.   vitamin E 100 UNIT capsule Take 100 Units by mouth daily.   zinc gluconate 50 MG tablet Take 50 mg by mouth daily.   No facility-administered encounter medications on file as of 05/05/2021.    Allergies (verified) Penicillins and Cefotetan   History: Past Medical History:  Diagnosis Date   Diverticulitis    Diverticulitis    Dyspnea    with exerton due to out of shape per pt    Osteoporosis    Ulcerative colitis    Past Surgical History:  Procedure Laterality Date   ABDOMINAL HYSTERECTOMY     COLONOSCOPY WITH PROPOFOL N/A 11/12/2019   Procedure: COLONOSCOPY WITH PROPOFOL;  Surgeon: Virgel Manifold, MD;  Location: [facility] ENDOSCOPY;  Service: Endoscopy;  Laterality: N/A;   INCONTINENCE SURGERY     TONSILLECTOMY     TONSILLECTOMY     Family History  Problem Relation Age of Onset   Cancer Mother        breast cancer   Breast cancer Mother 15  metastic   Cancer Father        pancreatic   Hepatitis C Sister    Thyroid disease Sister    Irritable bowel syndrome Sister    Allergies Sister    Social History   Socioeconomic History   Marital status: Married    Spouse name: [name]   Number of children: 3   Years of education: Not on file   Highest education level: Bachelor's degree (e.g., BA, AB, BS)  Occupational History   Occupation: Retired  Tobacco Use   Smoking status: Never   Smokeless tobacco: Never   Tobacco comments:    smoking cessation materials not required  Vaping Use   Vaping Use: Never used  Substance and Sexual Activity    Alcohol use: No   Drug use: No   Sexual activity: Not Currently  Other Topics Concern   Not on file  Social History Narrative   Not on file   Social Determinants of Health   Financial Resource Strain: Low Risk    Difficulty of Paying Living Expenses: Not very hard  Food Insecurity: No Food Insecurity   Worried About Charity fundraiser in the Last Year: Never true   Ran Out of Food in the Last Year: Never true  Transportation Needs: No Transportation Needs   Lack of Transportation (Medical): No   Lack of Transportation (Non-Medical): No  Physical Activity: Sufficiently Active   Days of Exercise per Week: 4 days   Minutes of Exercise per Session: 40 min  Stress: No Stress Concern Present   Feeling of Stress : Not at all  Social Connections: Moderately Integrated   Frequency of Communication with Friends and Family: More than three times a week   Frequency of Social Gatherings with Friends and Family: Twice a week   Attends Religious Services: More than 4 times per year   Active Member of [employer] or Organizations: No   Attends Music therapist: Never   Marital Status: Married    Tobacco Counseling Counseling given: Not Answered Tobacco comments: smoking cessation materials not required   Clinical Intake:  Pre-visit preparation completed: Yes  Pain : No/denies pain     Nutritional Risks: None Diabetes: No  How often do you need to have someone help you when you read instructions, pamphlets, or other written materials from your doctor or pharmacy?: 1 - Never What is the last grade level you completed in school?: BA  Diabetic?no  Interpreter Needed?: No  Information entered by :: Lacretia Nicks, [place]   Activities of Daily Living In your present state of health, do you have any difficulty performing the following activities: 05/05/2021 05/05/2021  Hearing? - N  Vision? Y N  Difficulty concentrating or making decisions? - N  Walking or climbing stairs? -  N  Dressing or bathing? - N  Doing errands, shopping? - N  Conservation officer, nature and eating ? N -  Using the Toilet? N -  In the past six months, have you accidently leaked urine? N -  Do you have problems with loss of bowel control? N -  Managing your Medications? N -  Managing your Finances? N -  Housekeeping or managing your Housekeeping? N -  Some recent data might be hidden    Patient Care Team: Cletis Athens, MD as PCP - General (Internal Medicine) [name], MD as Consulting Physician (Gastroenterology)  Indicate any recent Medical Services you may have received from other than [facility] providers in the past year (  date may be approximate).     Assessment:   This is a routine wellness examination for [place].  Hearing/Vision screen No results found.  Dietary issues and exercise activities discussed: Current Exercise Habits: Home exercise routine, Type of exercise: walking;Other - see comments (lots of chores around the home), Time (Minutes): 40, Frequency (Times/Week): 4, Weekly Exercise (Minutes/Week): 160, Intensity: Mild, Exercise limited by: None identified   Goals Addressed   None    Depression Screen PHQ 2/9 Scores 05/05/2021 08/28/2019 03/07/2019 09/19/2018 09/12/2018 09/13/2017 07/17/2017  PHQ - 2 Score 0 0 0 0 0 0 0  PHQ- 9 Score - 0 0 1 1 - -    Fall Risk Fall Risk  05/05/2021 05/27/2020 08/28/2019 03/07/2019 09/19/2018  Falls in the past year? 0 0 0 0 0  Number falls in past yr: 0 - 0 0 0  Injury with Fall? 0 - 0 0 0  Risk for fall due to : No Fall Risks - - - -  Risk for fall due to: Comment - - - - -  Follow up Falls evaluation completed - Falls evaluation completed Falls evaluation completed Falls prevention discussed    FALL RISK PREVENTION PERTAINING TO THE HOME:  Any stairs in or around the home? Yes  If so, are there any without handrails? No  Home free of loose throw rugs in walkways, pet beds, electrical cords, etc? Yes  Adequate lighting in your home to reduce  risk of falls? Yes   ASSISTIVE DEVICES UTILIZED TO PREVENT FALLS:  Life alert? No  Use of a cane, walker or w/c? No  Grab bars in the bathroom? No  Shower chair or bench in shower? No  Elevated toilet seat or a handicapped toilet? No   TIMED UP AND GO:  Was the test performed? No .  Length of time to ambulate- NA     Cognitive Function: MMSE - Mini Mental State Exam 05/05/2021  Not completed: Unable to complete     6CIT Screen 05/05/2021 09/19/2018 09/13/2017  What Year? 0 points 0 points 0 points  What month? 0 points 0 points 0 points  What time? 0 points 0 points 0 points  Count back from 20 0 points 0 points 0 points  Months in reverse 0 points 0 points 0 points  Repeat phrase 0 points 0 points 0 points  Total Score 0 0 0    Immunizations  There is no immunization history on file for this patient.  TDAP status: Due, Education has been provided regarding the importance of this vaccine. Advised may receive this vaccine at local pharmacy or Health Dept. Aware to provide a copy of the vaccination record if obtained from local pharmacy or Health Dept. Verbalized acceptance and understanding.  Flu Vaccine status: Declined, Education has been provided regarding the importance of this vaccine but patient still declined. Advised may receive this vaccine at local pharmacy or Health Dept. Aware to provide a copy of the vaccination record if obtained from local pharmacy or Health Dept. Verbalized acceptance and understanding.  Pneumococcal vaccine status: Declined,  Education has been provided regarding the importance of this vaccine but patient still declined. Advised may receive this vaccine at local pharmacy or Health Dept. Aware to provide a copy of the vaccination record if obtained from local pharmacy or Health Dept. Verbalized acceptance and understanding.   Covid-19 vaccine status: Declined, Education has been provided regarding the importance of this vaccine but patient still  declined. Advised may receive this  vaccine at local pharmacy or Health Dept.or vaccine clinic. Aware to provide a copy of the vaccination record if obtained from local pharmacy or Health Dept. Verbalized acceptance and understanding.  Qualifies for Shingles Vaccine? Yes   Zostavax completed No   Shingrix Completed?: No.    Education has been provided regarding the importance of this vaccine. Patient has been advised to call insurance company to determine out of pocket expense if they have not yet received this vaccine. Advised may also receive vaccine at local pharmacy or Health Dept. Verbalized acceptance and understanding.  Screening Tests Health Maintenance  Topic Date Due   DEXA SCAN  02/25/2021   COVID-19 Vaccine (1) 05/21/2021 (Originally 11/05/1950)   INFLUENZA VACCINE  07/29/2021 (Originally 11/29/2020)   Zoster Vaccines- Shingrix (1 of 2) 08/03/2021 (Originally 05/09/1999)   Pneumonia Vaccine 48+ Years old (1 - PCV) 05/05/2022 (Originally 05/08/2014)   TETANUS/TDAP  05/05/2022 (Originally [birth year]/08/1968)   MAMMOGRAM  07/23/2021   COLONOSCOPY (Pts 45-46yrs Insurance coverage will need to be confirmed)  11/11/2029   Hepatitis C Screening  Completed   HPV VACCINES  Aged Out    Health Maintenance  Health Maintenance Due  Topic Date Due   DEXA SCAN  02/25/2021    Colorectal cancer screening: Type of screening: Colonoscopy. Completed 11/12/2019. Repeat every 5 years  Mammogram status: Completed 07/23/2020. Repeat every year    Lung Cancer Screening: (Low Dose CT Chest recommended if Age 22-80 years, 30 pack-year currently smoking OR have quit w/in 15years.) does not qualify.   Lung Cancer Screening Referral: NA  Additional Screening:  Hepatitis C Screening: does not qualify; Completed 05/10/2020  Vision Screening: Recommended annual ophthalmology exams for early detection of glaucoma and other disorders of the eye. Is the patient up to date with their annual eye exam?  Yes  Who is  the provider or what is the name of the office in which the patient attends annual eye exams? [place] If pt is not established with a provider, would they like to be referred to a provider to establish care?  Patient already established and has upcoming apt. .   Dental Screening: Recommended annual dental exams for proper oral hygiene  Community Resource Referral / Chronic Care Management: CRR required this visit?  No   CCM required this visit?  No      Plan:     I have personally reviewed and noted the following in the patients chart:   Medical and social history Use of alcohol, tobacco or illicit drugs  Current medications and supplements including opioid prescriptions.  Functional ability and status Nutritional status Physical activity Advanced directives List of other physicians Hospitalizations, surgeries, and ER visits in previous 12 months Vitals Screenings to include cognitive, depression, and falls Referrals and appointments  In addition, I have reviewed and discussed with patient certain preventive protocols, quality metrics, and best practice recommendations. A written personalized care plan for preventive services as well as general preventive health recommendations were provided to patient.     Lacretia Nicks, [place]   05/05/2021   Nurse Notes:  Ms. Tegtmeyer , Thank you for taking time to come for your Medicare Wellness Visit. I appreciate your ongoing commitment to your health goals. Please review the following plan we discussed and let me know if I can assist you in the future.   These are the goals we discussed:  Goals      DIET - INCREASE WATER INTAKE     Recommend to drink at least  6-8 8oz glasses of water per day.     Increase physical activity     Recommend increasing physical activity to at least 150 minutes per week        This is a list of the screening recommended for you and due dates:  Health Maintenance  Topic Date Due   DEXA scan  (bone density measurement)  02/25/2021   COVID-19 Vaccine (1) 05/21/2021*   Flu Shot  07/29/2021*   Zoster (Shingles) Vaccine (1 of 2) 08/03/2021*   Pneumonia Vaccine (1 - PCV) 05/05/2022*   Tetanus Vaccine  05/05/2022*   Mammogram  07/23/2021   Colon Cancer Screening  11/11/2029   Hepatitis C Screening: USPSTF Recommendation to screen - Ages 18-79 yo.  Completed   HPV Vaccine  Aged Out  *Topic was postponed. The date shown is not the original due date.

## 2021-05-06 NOTE — Progress Notes (Signed)
I have reviewed this visit and agree with the documentation.   

## 2021-06-20 ENCOUNTER — Other Ambulatory Visit: Payer: Self-pay

## 2021-06-20 ENCOUNTER — Encounter: Payer: Self-pay | Admitting: Internal Medicine

## 2021-06-20 ENCOUNTER — Ambulatory Visit (INDEPENDENT_AMBULATORY_CARE_PROVIDER_SITE_OTHER): Payer: Medicare HMO | Admitting: Internal Medicine

## 2021-06-20 VITALS — BP 112/73 | HR 65 | Temp 97.6°F | Ht 64.49 in | Wt 153.8 lb

## 2021-06-20 DIAGNOSIS — K5790 Diverticulosis of intestine, part unspecified, without perforation or abscess without bleeding: Secondary | ICD-10-CM | POA: Diagnosis not present

## 2021-06-20 DIAGNOSIS — Z13820 Encounter for screening for osteoporosis: Secondary | ICD-10-CM | POA: Insufficient documentation

## 2021-06-20 DIAGNOSIS — K51819 Other ulcerative colitis with unspecified complications: Secondary | ICD-10-CM | POA: Diagnosis not present

## 2021-06-20 DIAGNOSIS — Z1231 Encounter for screening mammogram for malignant neoplasm of breast: Secondary | ICD-10-CM | POA: Diagnosis not present

## 2021-06-20 DIAGNOSIS — K5190 Ulcerative colitis, unspecified, without complications: Secondary | ICD-10-CM | POA: Insufficient documentation

## 2021-06-20 MED ORDER — VALACYCLOVIR HCL 500 MG PO TABS: 500.0000 mg | ORAL_TABLET | Freq: Two times a day (BID) | ORAL | 0 refills | Status: AC

## 2021-06-20 NOTE — Progress Notes (Signed)
BP 112/73    Pulse 65    Temp 97.6 F (36.4 C) (Oral)    Ht 5' 4.49" (1.638 m)    Wt 153 lb 12.8 oz (69.8 kg)    SpO2 99%    BMI 26.00 kg/m    Subjective:    Patient ID: Shirley Lowe, female    DOB: 09/28/1950, 71 y.o.   MRN: [id number]  Chief Complaint  Patient presents with   New Patient (Initial Visit)    HPI: Shirley Lowe is a 71 y.o. female  Pt is here to establish care, she has  a ho  Ulcerative rectosigmoiditis and diverticulosis and diverticulitis with cx and 2022 s/p  4 1/2 " of the sigmoid colon resected.  Pt sees Dr/ Bonna Gains for her UC which is in remission x 6 years ( last seen  by GI in 2022_ is on mesalamine   Chief Complaint  Patient presents with   New Patient (Initial Visit)    Relevant past medical, surgical, family and social history reviewed and updated as indicated. Interim medical history since our last visit reviewed. Allergies and medications reviewed and updated.  Review of Systems  Constitutional:  Negative for activity change, appetite change, chills, fatigue and fever.  HENT:  Negative for congestion, ear discharge, ear pain and facial swelling.   Eyes:  Negative for pain and itching.  Respiratory:  Negative for cough, chest tightness, shortness of breath and wheezing.   Cardiovascular:  Negative for chest pain, palpitations and leg swelling.  Gastrointestinal:  Negative for abdominal distention, abdominal pain, blood in stool, constipation, diarrhea, nausea and vomiting.  Endocrine: Negative for cold intolerance, heat intolerance, polydipsia, polyphagia and polyuria.  Genitourinary:  Negative for difficulty urinating, dysuria, flank pain, frequency, hematuria and urgency.  Musculoskeletal:  Negative for arthralgias, gait problem, joint swelling and myalgias.  Skin:  Negative for color change, rash and wound.  Neurological:  Negative for dizziness, tremors, speech difficulty, weakness, light-headedness, numbness and headaches.   Hematological:  Does not bruise/bleed easily.  Psychiatric/Behavioral:  Negative for agitation, confusion, decreased concentration, sleep disturbance and suicidal ideas.    Per HPI unless specifically indicated above  Past Medical History:  Diagnosis Date   Diverticulitis    Diverticulitis    Dyspnea    with exerton due to out of shape per pt    Osteoporosis    Ulcerative colitis    Past Surgical History:  Procedure Laterality Date   ABDOMINAL HYSTERECTOMY     COLON RESECTION SIGMOID N/A    COLONOSCOPY WITH PROPOFOL N/A 11/12/2019   Procedure: COLONOSCOPY WITH PROPOFOL;  Surgeon: Virgel Manifold, MD;  Location: [facility] ENDOSCOPY;  Service: Endoscopy;  Laterality: N/A;   INCONTINENCE SURGERY     TONSILLECTOMY     TONSILLECTOMY          Objective:    BP 112/73    Pulse 65    Temp 97.6 F (36.4 C) (Oral)    Ht 5' 4.49" (1.638 m)    Wt 153 lb 12.8 oz (69.8 kg)    SpO2 99%    BMI 26.00 kg/m   Wt Readings from Last 3 Encounters:  06/20/21 153 lb 12.8 oz (69.8 kg)  09/24/20 159 lb 13.3 oz (72.5 kg)  09/14/20 156 lb (70.8 kg)    Physical Exam  Results for orders placed or performed during the hospital encounter of 09/22/20  CBC  Result Value Ref Range   WBC 11.2 (H) 4.0 - 10.5 K/uL  RBC 4.02 3.87 - 5.11 MIL/uL   Hemoglobin 11.7 (L) 12.0 - 15.0 g/dL   HCT 36.0 36.0 - 46.0 %   MCV 89.6 80.0 - 100.0 fL   MCH 29.1 26.0 - 34.0 pg   MCHC 32.5 30.0 - 36.0 g/dL   RDW 13.4 11.5 - 15.5 %   Platelets 194 150 - 400 K/uL   nRBC 0.0 0.0 - 0.2 %  Basic metabolic panel  Result Value Ref Range   Sodium 139 135 - 145 mmol/L   Potassium 3.9 3.5 - 5.1 mmol/L   Chloride 107 98 - 111 mmol/L   CO2 26 22 - 32 mmol/L   Glucose, Bld 134 (H) 70 - 99 mg/dL   BUN 7 (L) 8 - 23 mg/dL   Creatinine, Ser 0.49 0.44 - 1.00 mg/dL   Calcium 8.6 (L) 8.9 - 10.3 mg/dL   GFR, Estimated >60 >60 mL/min   Anion gap 6 5 - 15  CBC  Result Value Ref Range   WBC 9.4 4.0 - 10.5 K/uL   RBC 3.94 3.87 -  5.11 MIL/uL   Hemoglobin 11.7 (L) 12.0 - 15.0 g/dL   HCT 36.1 36.0 - 46.0 %   MCV 91.6 80.0 - 100.0 fL   MCH 29.7 26.0 - 34.0 pg   MCHC 32.4 30.0 - 36.0 g/dL   RDW 13.7 11.5 - 15.5 %   Platelets 189 150 - 400 K/uL   nRBC 0.0 0.0 - 0.2 %  Basic metabolic panel  Result Value Ref Range   Sodium 140 135 - 145 mmol/L   Potassium 3.9 3.5 - 5.1 mmol/L   Chloride 107 98 - 111 mmol/L   CO2 25 22 - 32 mmol/L   Glucose, Bld 80 70 - 99 mg/dL   BUN 14 8 - 23 mg/dL   Creatinine, Ser 0.57 0.44 - 1.00 mg/dL   Calcium 8.5 (L) 8.9 - 10.3 mg/dL   GFR, Estimated >60 >60 mL/min   Anion gap 8 5 - 15  ABO/Rh  Result Value Ref Range   ABO/RH(D)      B POS Performed at [facility] [address], [place], [place] [address]   Surgical pathology  Result Value Ref Range   SURGICAL PATHOLOGY      SURGICAL PATHOLOGY CASE: [id number] PATIENT: [name] Surgical Pathology Report     Clinical History: Diverticular disease (crm)     FINAL MICROSCOPIC DIAGNOSIS:  A. COLON, SIGMOID, RESECTION: - Segment of colon (15 cm) showing diverticulosis, diverticulitis and associated microabscesses - Margins appear viable      GROSS DESCRIPTION:  Received fresh is a 15 cm segment of colon, clinically sigmoid colon. There are dense adhesions present on the serosal surface.  The attached mesentery is focally indurated and hemorrhagic.  One margin is patent and the opposite is stapled.  The mucosa is glistening, tan and there are numerous diverticula present.  In the midportion the wall shows fibrous thickening measuring up to 1 cm and the lumen is narrowed measuring 1 cm at the narrowest point.  A perforated diverticulum is not identified.  Sections are submitted in 5 cassettes. 1 = patent margin 2 = stapled margin 3-5 = representative sections (GRP 5/ 26/2022)    Final Diagnosis performed by Jaquita Folds, MD.   Electronically signed 09/24/2020 Technical and /  or Professional components performed at [facility], [place] [address] [place], [place] [address].  Immunohistochemistry Technical component (if applicable) was performed at [facility]. [address] [place], [place], [place] [address].   IMMUNOHISTOCHEMISTRY DISCLAIMER (if applicable): Some of these immunohistochemical stains may have been developed and the performance characteristics determine by [facility]. Some may not have been cleared or approved by the U.S. Food and Drug Administration. The FDA has determined that such clearance or approval is not necessary. This test is used for clinical purposes. It should not be regarded as investigational or for research. This laboratory is certified under the [place] (CLIA-88) as qualified to perform high complexity c linical laboratory testing.  The controls stained appropriately.         Current Outpatient Medications:    ascorbic acid (VITAMIN C) 1000 MG tablet, Take 1,000 mg by mouth daily., Disp: , Rfl:    B Complex Vitamins (VITAMIN B-COMPLEX) TABS, Take 1 tablet by mouth daily., Disp: , Rfl:    calcium carbonate (OSCAL) 1500 (600 Ca) MG TABS tablet, Take 600 mg of elemental calcium by mouth daily., Disp: , Rfl:    Cholecalciferol (VITAMIN D) 50 MCG (2000 UT) CAPS, Take 1 capsule (2,000 Units total) by mouth daily., Disp: 30 capsule, Rfl: 0   Lactobacillus Rhamnosus, GG, (RA PROBIOTIC DIGESTIVE CARE) CAPS, Take 1 capsule by mouth daily., Disp: , Rfl:    mesalamine (LIALDA) 1.2 g EC tablet, TAKE 1 TABLET (1.2 G TOTAL) BY MOUTH IN THE MORNING AND AT BEDTIME. (Patient taking differently: Take by mouth at bedtime.), Disp: 180 tablet, Rfl: 0   Omega-3 Fatty Acids (FISH OIL) 1000 MG CAPS, Take 1,000 mg by mouth daily., Disp: , Rfl:    vitamin E 100 UNIT capsule, Take 100 Units by mouth daily., Disp: , Rfl:    zinc gluconate 50 MG tablet, Take 50 mg  by mouth daily., Disp: , Rfl:    acetaminophen (TYLENOL) 500 MG tablet, Take 2 tablets (1,000 mg total) by mouth every 6 (six) hours., Disp: 30 tablet, Rfl: 0    Assessment & Plan:   Problem List Items Addressed This Visit   None Visit Diagnoses     Encounter for screening mammogram for malignant neoplasm of breast    -  Primary   Relevant Orders   MM DIAG BREAST TOMO BILATERAL   Screening for osteoporosis       Relevant Orders   DG Bone Density        Orders Placed This Encounter  Procedures   MM DIAG BREAST TOMO BILATERAL   DG Bone Density     No orders of the defined types were placed in this encounter.    Follow up plan: No follow-ups on file.

## 2021-06-20 NOTE — Patient Instructions (Signed)
Please call to schedule your mammogram and/or bone density: °[facility]  °Address: [address], [place], [place] [address]  °Phone: [phone] ° °

## 2021-07-04 ENCOUNTER — Other Ambulatory Visit: Payer: Self-pay

## 2021-07-04 ENCOUNTER — Other Ambulatory Visit: Payer: Medicare HMO

## 2021-07-04 ENCOUNTER — Telehealth: Payer: Self-pay

## 2021-07-04 DIAGNOSIS — K5790 Diverticulosis of intestine, part unspecified, without perforation or abscess without bleeding: Secondary | ICD-10-CM

## 2021-07-04 DIAGNOSIS — R82998 Other abnormal findings in urine: Secondary | ICD-10-CM

## 2021-07-04 DIAGNOSIS — K51819 Other ulcerative colitis with unspecified complications: Secondary | ICD-10-CM

## 2021-07-04 LAB — URINALYSIS, ROUTINE W REFLEX MICROSCOPIC
Bilirubin, UA: NEGATIVE
Glucose, UA: NEGATIVE
Ketones, UA: NEGATIVE
Nitrite, UA: POSITIVE — AB
Protein,UA: NEGATIVE
RBC, UA: NEGATIVE
Specific Gravity, UA: 1.025 (ref 1.005–1.030)
Urobilinogen, Ur: 0.2 mg/dL (ref 0.2–1.0)
pH, UA: 6.5 (ref 5.0–7.5)

## 2021-07-04 LAB — MICROSCOPIC EXAMINATION: RBC, Urine: NONE SEEN /hpf (ref 0–2)

## 2021-07-04 NOTE — Telephone Encounter (Signed)
Patient required urine culture ? ?

## 2021-07-04 NOTE — Progress Notes (Signed)
Has a UTI can u pl see if she is symptomatic thnx. Need a urine culture

## 2021-07-05 LAB — LIPID PANEL
Chol/HDL Ratio: 2.8 ratio (ref 0.0–4.4)
Cholesterol, Total: 219 mg/dL — ABNORMAL HIGH (ref 100–199)
HDL: 77 mg/dL (ref >39)
LDL Chol Calc (NIH): 122 mg/dL — ABNORMAL HIGH (ref 0–99)
Triglycerides: 113 mg/dL (ref 0–149)
VLDL Cholesterol Cal: 20 mg/dL (ref 5–40)

## 2021-07-05 LAB — COMPREHENSIVE METABOLIC PANEL
ALT: 12 IU/L (ref 0–32)
AST: 15 IU/L (ref 0–40)
Albumin/Globulin Ratio: 1.6 (ref 1.2–2.2)
Albumin: 4.2 g/dL (ref 3.7–4.7)
Alkaline Phosphatase: 57 IU/L (ref 44–121)
BUN/Creatinine Ratio: 25 (ref 12–28)
BUN: 18 mg/dL (ref 8–27)
Bilirubin Total: 0.4 mg/dL (ref 0.0–1.2)
CO2: 26 mmol/L (ref 20–29)
Calcium: 9.3 mg/dL (ref 8.7–10.3)
Chloride: 104 mmol/L (ref 96–106)
Creatinine, Ser: 0.71 mg/dL (ref 0.57–1.00)
Globulin, Total: 2.7 g/dL (ref 1.5–4.5)
Glucose: 81 mg/dL (ref 70–99)
Potassium: 4 mmol/L (ref 3.5–5.2)
Sodium: 143 mmol/L (ref 134–144)
Total Protein: 6.9 g/dL (ref 6.0–8.5)
eGFR: 90 mL/min/{1.73_m2} (ref >59)

## 2021-07-05 LAB — CBC WITH DIFFERENTIAL/PLATELET
Basophils Absolute: 0 10*3/uL (ref 0.0–0.2)
Basos: 1 %
EOS (ABSOLUTE): 0.1 10*3/uL (ref 0.0–0.4)
Eos: 2 %
Hematocrit: 42.1 % (ref 34.0–46.6)
Hemoglobin: 13.9 g/dL (ref 11.1–15.9)
Immature Grans (Abs): 0 10*3/uL (ref 0.0–0.1)
Immature Granulocytes: 0 %
Lymphocytes Absolute: 1.7 10*3/uL (ref 0.7–3.1)
Lymphs: 32 %
MCH: 29.3 pg (ref 26.6–33.0)
MCHC: 33 g/dL (ref 31.5–35.7)
MCV: 89 fL (ref 79–97)
Monocytes Absolute: 0.5 10*3/uL (ref 0.1–0.9)
Monocytes: 9 %
Neutrophils Absolute: 3 10*3/uL (ref 1.4–7.0)
Neutrophils: 56 %
Platelets: 226 10*3/uL (ref 150–450)
RBC: 4.74 x10E6/uL (ref 3.77–5.28)
RDW: 12.9 % (ref 11.7–15.4)
WBC: 5.4 10*3/uL (ref 3.4–10.8)

## 2021-07-05 LAB — TSH: TSH: 2.4 u[IU]/mL (ref 0.450–4.500)

## 2021-07-07 ENCOUNTER — Other Ambulatory Visit: Payer: Self-pay | Admitting: Internal Medicine

## 2021-07-07 MED ORDER — SULFAMETHOXAZOLE-TRIMETHOPRIM 800-160 MG PO TABS: 1.0000 | ORAL_TABLET | Freq: Two times a day (BID) | ORAL | 0 refills | Status: DC

## 2021-07-07 NOTE — Progress Notes (Signed)
Will start pt on abx for such pl see if she is symptomatic.

## 2021-07-08 LAB — URINE CULTURE

## 2021-07-11 ENCOUNTER — Ambulatory Visit (INDEPENDENT_AMBULATORY_CARE_PROVIDER_SITE_OTHER): Payer: Medicare HMO | Admitting: Internal Medicine

## 2021-07-11 ENCOUNTER — Encounter: Payer: Self-pay | Admitting: Internal Medicine

## 2021-07-11 ENCOUNTER — Other Ambulatory Visit: Payer: Self-pay

## 2021-07-11 VITALS — BP 114/67 | HR 55 | Temp 97.6°F | Ht 64.49 in | Wt 157.4 lb

## 2021-07-11 DIAGNOSIS — N390 Urinary tract infection, site not specified: Secondary | ICD-10-CM | POA: Diagnosis not present

## 2021-07-11 DIAGNOSIS — K51819 Other ulcerative colitis with unspecified complications: Secondary | ICD-10-CM | POA: Diagnosis not present

## 2021-07-11 DIAGNOSIS — K5790 Diverticulosis of intestine, part unspecified, without perforation or abscess without bleeding: Secondary | ICD-10-CM

## 2021-07-11 LAB — URINALYSIS, ROUTINE W REFLEX MICROSCOPIC
Bilirubin, UA: NEGATIVE
Glucose, UA: NEGATIVE
Ketones, UA: NEGATIVE
Nitrite, UA: NEGATIVE
Protein,UA: NEGATIVE
RBC, UA: NEGATIVE
Specific Gravity, UA: >1.03 — ABNORMAL HIGH (ref 1.005–1.030)
Urobilinogen, Ur: 0.2 mg/dL (ref 0.2–1.0)
pH, UA: 5.5 (ref 5.0–7.5)

## 2021-07-11 LAB — MICROSCOPIC EXAMINATION: RBC, Urine: NONE SEEN /hpf (ref 0–2)

## 2021-07-11 NOTE — Telephone Encounter (Signed)
Spoke with Dr. Neomia Dear and she stated that your urine is ok and that you do not need an antibiotic at this time. If you do start having symptoms please call us ?

## 2021-07-11 NOTE — Progress Notes (Signed)
? ?BP 114/67   Pulse (!) 55   Temp 97.6 ?F (36.4 ?C) (Oral)   Ht 5' 4.49" (1.638 m)   Wt 157 lb 6.4 oz (71.4 kg)   SpO2 100%   BMI 26.61 kg/m?   ? ?Subjective:  ? ? Patient ID: Shirley Lowe, female    DOB: [birth year], 71 y.o.   MRN: [id number] ? ?Chief Complaint  ?Patient presents with  ?? Diverticulosis  ?? labwork   ?  Review labwork  ? ? ?HPI: ?Shirley Lowe is a 71 y.o. female ? ?Pt has a ho UC - sees GI for such, pt is asymptomatic. Sees dr. Darene Lamer  ? ? ?Urinary Tract Infection  ?This is a new (UA +ve last checked. wnl now.) problem. She is Not sexually active. There is No history of pyelonephritis. Pertinent negatives include no chills, discharge, flank pain, frequency, hematuria, hesitancy, nausea, possible pregnancy, sweats, urgency or vomiting.  ? ?Chief Complaint  ?Patient presents with  ?? Diverticulosis  ?? labwork   ?  Review labwork  ? ? ?Relevant past medical, surgical, family and social history reviewed and updated as indicated. Interim medical history since our last visit reviewed. ?Allergies and medications reviewed and updated. ? ?Review of Systems  ?Constitutional:  Negative for activity change, appetite change, chills, fatigue and fever.  ?HENT:  Negative for congestion, ear discharge, ear pain and facial swelling.   ?Eyes:  Negative for pain and itching.  ?Respiratory:  Negative for cough, chest tightness, shortness of breath and wheezing.   ?Cardiovascular:  Negative for chest pain, palpitations and leg swelling.  ?Gastrointestinal:  Negative for abdominal distention, abdominal pain, blood in stool, constipation, diarrhea, nausea and vomiting.  ?Endocrine: Negative for cold intolerance, heat intolerance, polydipsia, polyphagia and polyuria.  ?Genitourinary:  Negative for difficulty urinating, dysuria, flank pain, frequency, hematuria, hesitancy and urgency.  ?Musculoskeletal:  Negative for arthralgias, gait problem, joint swelling and myalgias.  ?Skin:  Negative for color change,  rash and wound.  ?Neurological:  Negative for dizziness, tremors, speech difficulty, weakness, light-headedness, numbness and headaches.  ?Hematological:  Does not bruise/bleed easily.  ?Psychiatric/Behavioral:  Negative for agitation, confusion, decreased concentration, sleep disturbance and suicidal ideas.   ? ?Per HPI unless specifically indicated above ? ?   ?Objective:  ?  ?BP 114/67   Pulse (!) 55   Temp 97.6 ?F (36.4 ?C) (Oral)   Ht 5' 4.49" (1.638 m)   Wt 157 lb 6.4 oz (71.4 kg)   SpO2 100%   BMI 26.61 kg/m?   ?Wt Readings from Last 3 Encounters:  ?07/11/21 157 lb 6.4 oz (71.4 kg)  ?06/20/21 153 lb 12.8 oz (69.8 kg)  ?09/24/20 159 lb 13.3 oz (72.5 kg)  ?  ?Physical Exam ?Vitals and nursing note reviewed.  ?Constitutional:   ?   General: She is not in acute distress. ?   Appearance: Normal appearance. She is not ill-appearing or diaphoretic.  ?Eyes:  ?   Conjunctiva/sclera: Conjunctivae normal.  ?Pulmonary:  ?   Breath sounds: No rhonchi.  ?Abdominal:  ?   General: Abdomen is flat. Bowel sounds are normal. There is no distension.  ?   Palpations: Abdomen is soft. There is no mass.  ?   Tenderness: There is no abdominal tenderness. There is no guarding.  ?Skin: ?   General: Skin is warm and dry.  ?   Coloration: Skin is not jaundiced.  ?   Findings: No erythema.  ?Neurological:  ?   Mental Status: She  is alert.  ? ? ?Results for orders placed or performed in visit on 07/04/21  ?Urine Culture  ? Specimen: Urine  ? UR  ?Result Value Ref Range  ? Urine Culture, Routine Final report (A)   ? Organism ID, Bacteria Comment (A)   ? Antimicrobial Susceptibility Comment   ? ?   ? ? ?Current Outpatient Medications:  ??  acetaminophen (TYLENOL) 500 MG tablet, Take 2 tablets (1,000 mg total) by mouth every 6 (six) hours., Disp: 30 tablet, Rfl: 0 ??  ascorbic acid (VITAMIN C) 1000 MG tablet, Take 1,000 mg by mouth daily., Disp: , Rfl:  ??  B Complex Vitamins (VITAMIN B-COMPLEX) TABS, Take 1 tablet by mouth daily.,  Disp: , Rfl:  ??  calcium carbonate (OSCAL) 1500 (600 Ca) MG TABS tablet, Take 600 mg of elemental calcium by mouth daily., Disp: , Rfl:  ??  Cholecalciferol (VITAMIN D) 50 MCG (2000 UT) CAPS, Take 1 capsule (2,000 Units total) by mouth daily., Disp: 30 capsule, Rfl: 0 ??  mesalamine (LIALDA) 1.2 g EC tablet, TAKE 1 TABLET (1.2 G TOTAL) BY MOUTH IN THE MORNING AND AT BEDTIME. (Patient taking differently: Take by mouth at bedtime.), Disp: 180 tablet, Rfl: 0 ??  Omega-3 Fatty Acids (FISH OIL) 1000 MG CAPS, Take 1,000 mg by mouth daily., Disp: , Rfl:  ??  vitamin E 100 UNIT capsule, Take 100 Units by mouth daily., Disp: , Rfl:  ??  zinc gluconate 50 MG tablet, Take 50 mg by mouth daily., Disp: , Rfl:  ??  Lactobacillus Rhamnosus, GG, (RA PROBIOTIC DIGESTIVE CARE) CAPS, Take 1 capsule by mouth daily. (Patient not taking: Reported on 07/11/2021), Disp: , Rfl:   ? ? ?Assessment & Plan:  ?UC: Will need to fu with GI.  ?Ho sigmoid colectomy in may  ? ?2. UTI asymptomatic Recheck. check UA.   ?pt is currently symptomatic for an Urinary tract infection(abd pain, burning etc), will cover with emperic abx, see med module for details.  encouraged to increase water/fluid intake.Signs and symptoms of emergency were discussed with the patient. The risks, benefits and side effects of treatment were discussed with the patient. The patient verbalized an understanding of plan, and was told to call the clinic/go to the ED if symptoms worsen at any point of time. ? ?Problem List Items Addressed This Visit   ? ?  ? Digestive  ? Diverticulosis  ? Ulcerative colitis ([place])  ?  ? Genitourinary  ? Urinary tract infection without hematuria - Primary  ? Relevant Orders  ? Urinalysis  ?  ? ?Orders Placed This Encounter  ?Procedures  ?? Urinalysis  ?  ? ?No orders of the defined types were placed in this encounter. ?  ? ?Follow up plan: ?Return in about 6 months (around 01/11/2022). ? ? ?

## 2021-08-04 ENCOUNTER — Telehealth: Payer: Self-pay | Admitting: Gastroenterology

## 2021-08-04 DIAGNOSIS — K51311 Ulcerative (chronic) rectosigmoiditis with rectal bleeding: Secondary | ICD-10-CM

## 2021-08-04 MED ORDER — MESALAMINE 1.2 G PO TBEC: DELAYED_RELEASE_TABLET | ORAL | 0 refills | Status: DC

## 2021-08-04 NOTE — Telephone Encounter (Signed)
Yes

## 2021-08-04 NOTE — Telephone Encounter (Signed)
Last office visit 06/16/20  ?Last refill 04/28/21 0 refills  ?Has appointment on 11/28/21 ?Can this be refilled till appointment  ?

## 2021-08-04 NOTE — Telephone Encounter (Signed)
Pt has an appointment set for 07/31 with Dr. Vicente Males she was a pt of Dr. Bonna Gains pt is request ing a refill of Mesalamine her pharmacy is [address] ?

## 2021-08-04 NOTE — Telephone Encounter (Signed)
sENT MEDICATION TO THE PHARMACY  ?

## 2021-08-25 ENCOUNTER — Other Ambulatory Visit: Payer: Self-pay | Admitting: Internal Medicine

## 2021-08-25 DIAGNOSIS — Z1231 Encounter for screening mammogram for malignant neoplasm of breast: Secondary | ICD-10-CM

## 2021-08-31 ENCOUNTER — Encounter: Payer: Self-pay | Admitting: Ophthalmology

## 2021-09-08 NOTE — Discharge Instructions (Signed)

## 2021-09-13 ENCOUNTER — Encounter
Admission: RE | Disposition: A | Discharge status: Home / Self Care | Payer: Self-pay | Source: Home / Self Care | Attending: Ophthalmology

## 2021-09-13 ENCOUNTER — Encounter: Payer: Self-pay | Admitting: Ophthalmology

## 2021-09-13 ENCOUNTER — Ambulatory Visit: Payer: Medicare HMO | Admitting: Anesthesiology

## 2021-09-13 ENCOUNTER — Ambulatory Visit
Admission: RE | Admit: 2021-09-13 | Discharge: 2021-09-13 | Disposition: A | Discharge status: Home / Self Care | Payer: Medicare HMO | Attending: Ophthalmology | Admitting: Ophthalmology

## 2021-09-13 ENCOUNTER — Other Ambulatory Visit: Payer: Self-pay

## 2021-09-13 DIAGNOSIS — H2511 Age-related nuclear cataract, right eye: Secondary | ICD-10-CM | POA: Diagnosis present

## 2021-09-13 HISTORY — PX: CATARACT EXTRACTION W/PHACO: SHX586

## 2021-09-13 SURGERY — PHACOEMULSIFICATION, CATARACT, WITH IOL INSERTION
Anesthesia: Monitor Anesthesia Care | Site: Eye | Laterality: Right

## 2021-09-13 MED ORDER — CYCLOPENTOLATE HCL 2 % OP SOLN
1.0000 [drp] | OPHTHALMIC | Status: AC | PRN
  Administered 2021-09-13 (×3): 1 [drp] via OPHTHALMIC

## 2021-09-13 MED ORDER — MOXIFLOXACIN HCL 0.5 % OP SOLN
OPHTHALMIC | Status: DC | PRN
  Administered 2021-09-13: 0.2 mL via OPHTHALMIC

## 2021-09-13 MED ORDER — SIGHTPATH DOSE#1 BSS IO SOLN
INTRAOCULAR | Status: DC | PRN
  Administered 2021-09-13: 15 mL

## 2021-09-13 MED ORDER — BRIMONIDINE TARTRATE-TIMOLOL 0.2-0.5 % OP SOLN
OPHTHALMIC | Status: DC | PRN
  Administered 2021-09-13: 1 [drp] via OPHTHALMIC

## 2021-09-13 MED ORDER — ACETAMINOPHEN 160 MG/5ML PO SOLN: 325.0000 mg | ORAL | Status: DC | PRN

## 2021-09-13 MED ORDER — FENTANYL CITRATE (PF) 100 MCG/2ML IJ SOLN
INTRAMUSCULAR | Status: DC | PRN
  Administered 2021-09-13: 50 ug via INTRAVENOUS

## 2021-09-13 MED ORDER — SIGHTPATH DOSE#1 BSS IO SOLN
INTRAOCULAR | Status: DC | PRN
  Administered 2021-09-13: 1 mL via INTRAMUSCULAR

## 2021-09-13 MED ORDER — ONDANSETRON HCL 4 MG/2ML IJ SOLN: 4.0000 mg | Freq: Once | INTRAMUSCULAR | Status: DC | PRN

## 2021-09-13 MED ORDER — TETRACAINE HCL 0.5 % OP SOLN
1.0000 [drp] | OPHTHALMIC | Status: DC | PRN
  Administered 2021-09-13 (×3): 1 [drp] via OPHTHALMIC

## 2021-09-13 MED ORDER — ACETAMINOPHEN 325 MG PO TABS: 325.0000 mg | ORAL_TABLET | ORAL | Status: DC | PRN

## 2021-09-13 MED ORDER — SIGHTPATH DOSE#1 NA CHONDROIT SULF-NA HYALURON 40-17 MG/ML IO SOLN
INTRAOCULAR | Status: DC | PRN
  Administered 2021-09-13: 1 mL via INTRAOCULAR

## 2021-09-13 MED ORDER — MIDAZOLAM HCL 2 MG/2ML IJ SOLN
INTRAMUSCULAR | Status: DC | PRN
  Administered 2021-09-13: 1 mg via INTRAVENOUS

## 2021-09-13 MED ORDER — PHENYLEPHRINE HCL 10 % OP SOLN
1.0000 [drp] | OPHTHALMIC | Status: AC | PRN
Start: 2021-09-13 — End: 2021-09-13
  Administered 2021-09-13 (×3): 1 [drp] via OPHTHALMIC

## 2021-09-13 MED ORDER — SIGHTPATH DOSE#1 BSS IO SOLN
INTRAOCULAR | Status: DC | PRN
  Administered 2021-09-13: 53 mL via OPHTHALMIC

## 2021-09-13 SURGICAL SUPPLY — 11 items
CATARACT SUITE SIGHTPATH (MISCELLANEOUS) ×2 IMPLANT
FEE CATARACT SUITE SIGHTPATH (MISCELLANEOUS) ×1 IMPLANT
GLOVE SURG ENC TEXT LTX SZ8 (GLOVE) ×2 IMPLANT
GLOVE SURG TRIUMPH 8.0 PF LTX (GLOVE) ×2 IMPLANT
LENS IOL RAYNER 23.5 (Intraocular Lens) ×2 IMPLANT
LENS IOL RAYONE EMV 23.5 (Intraocular Lens) IMPLANT
NDL FILTER BLUNT 18X1 1/2 (NEEDLE) ×1 IMPLANT
NEEDLE FILTER BLUNT 18X 1/2SAF (NEEDLE) ×1
NEEDLE FILTER BLUNT 18X1 1/2 (NEEDLE) ×1 IMPLANT
SYR 3ML LL SCALE MARK (SYRINGE) ×2 IMPLANT
WATER STERILE IRR 250ML POUR (IV SOLUTION) ×2 IMPLANT

## 2021-09-13 NOTE — Op Note (Signed)
PREOPERATIVE DIAGNOSIS:  Nuclear sclerotic cataract of the right eye. ?  ?POSTOPERATIVE DIAGNOSIS:  H25.11 Cataract ?  ?OPERATIVE PROCEDURE:ORPROCALL@ ?  ?SURGEON:  Birder Robson, MD. ?  ?ANESTHESIA: ? ?Anesthesiologist: Veda Canning, MD ?CRNA: Silvana Newness, CRNA ? ?1.      Managed anesthesia care. ?2.      0.48m of Shugarcaine was instilled in the eye following the paracentesis. ?  ?COMPLICATIONS:  None. ?  ?TECHNIQUE:   Stop and chop ?  ?DESCRIPTION OF PROCEDURE:  The patient was examined and consented in the preoperative holding area where the aforementioned topical anesthesia was applied to the right eye and then brought back to the Operating Room where the right eye was prepped and draped in the usual sterile ophthalmic fashion and a lid speculum was placed. A paracentesis was created with the side port blade and the anterior chamber was filled with viscoelastic. A near clear corneal incision was performed with the steel keratome. A continuous curvilinear capsulorrhexis was performed with a cystotome followed by the capsulorrhexis forceps. Hydrodissection and hydrodelineation were carried out with BSS on a blunt cannula. The lens was removed in a stop and chop  technique and the remaining cortical material was removed with the irrigation-aspiration handpiece. The capsular bag was inflated with viscoelastic and the [id number]  lens was placed in the capsular bag without complication. The remaining viscoelastic was removed from the eye with the irrigation-aspiration handpiece. The wounds were hydrated. The anterior chamber was flushed with BSS and the eye was inflated to physiologic pressure. 0.123mof Vigamox was placed in the anterior chamber. The wounds were found to be water tight. The eye was dressed with Combigan. The patient was given protective glasses to wear throughout the day and a shield with which to sleep tonight. The patient was also given drops with which to begin a drop regimen today and will  follow-up with me in one day. ?Implant Name Type Inv. Item Serial No. Manufacturer Lot No. LRB No. Used Action  ?LENS IOL RAYNER 23.5 - [id number] Lens LENS IOL RAYNER 23.5  [employer] [id number] 1 Implanted  ? ?Procedure(s) with comments: ?CATARACT EXTRACTION PHACO AND INTRAOCULAR LENS PLACEMENT ([facility]) RIGHT RAYNOR LENS (Right) - 5.32 ?00:40.7 ? ?Electronically signed: WiBirder Robson/16/2023 8:21 AM ? ?

## 2021-09-13 NOTE — Anesthesia Postprocedure Evaluation (Signed)
Anesthesia Post Note ? ?Patient: Shirley Lowe ? ?Procedure(s) Performed: CATARACT EXTRACTION PHACO AND INTRAOCULAR LENS PLACEMENT ([facility]) RIGHT RAYNOR LENS (Right: Eye) ? ? ?  ?Patient location during evaluation: PACU ?Anesthesia Type: MAC ?Level of consciousness: awake ?Pain management: pain level controlled ?Vital Signs Assessment: post-procedure vital signs reviewed and stable ?Respiratory status: respiratory function stable ?Cardiovascular status: stable ?Postop Assessment: no apparent nausea or vomiting ?Anesthetic complications: no ? ? ?No notable events documented. ? ?Veda Canning ? ? ? ? ? ?

## 2021-09-13 NOTE — H&P (Signed)
[place]  ? ?Primary Care Physician:  Charlynne Cousins, MD ?Ophthalmologist: Dr. George Ina ? ?Pre-Procedure History & Physical: ?HPI:  Shirley Lowe is a 71 y.o. female here for cataract surgery. ?  ?Past Medical History:  ?Diagnosis Date  ? Diverticulitis   ? Diverticulitis   ? Dyspnea   ? with exerton due to out of shape per pt   ? Osteoporosis   ? Ulcerative colitis   ? ? ?Past Surgical History:  ?Procedure Laterality Date  ? ABDOMINAL HYSTERECTOMY    ? COLON RESECTION SIGMOID N/A   ? COLONOSCOPY WITH PROPOFOL N/A 11/12/2019  ? Procedure: COLONOSCOPY WITH PROPOFOL;  Surgeon: Virgel Manifold, MD;  Location: [facility] ENDOSCOPY;  Service: Endoscopy;  Laterality: N/A;  ? INCONTINENCE SURGERY    ? TONSILLECTOMY    ? TONSILLECTOMY    ? ? ?Prior to Admission medications   ?Medication Sig Start Date End Date Taking? Authorizing Provider  ?acetaminophen (TYLENOL) 500 MG tablet Take 2 tablets (1,000 mg total) by mouth every 6 (six) hours. 3/90/30  Yes Leighton Ruff, MD  ?ascorbic acid (VITAMIN C) 1000 MG tablet Take 1,000 mg by mouth daily.   Yes [provider]  ?B Complex Vitamins (VITAMIN B-COMPLEX) TABS Take 1 tablet by mouth daily.   Yes [provider]  ?calcium carbonate (OSCAL) 1500 (600 Ca) MG TABS tablet Take 600 mg of elemental calcium by mouth daily.   Yes [provider]  ?Cholecalciferol (VITAMIN D) 50 MCG (2000 UT) CAPS Take 1 capsule (2,000 Units total) by mouth daily. 08/28/19  Yes Sowles, Drue Stager, MD  ?mesalamine (LIALDA) 1.2 g EC tablet TAKE 1 TABLET (1.2 G TOTAL) BY MOUTH IN THE MORNING AND AT BEDTIME. ?Strength: 1.2 g 08/04/21  Yes Jonathon Bellows, MD  ?Omega-3 Fatty Acids (FISH OIL) 1000 MG CAPS Take 1,000 mg by mouth daily.   Yes [provider]  ?vitamin E 100 UNIT capsule Take 100 Units by mouth daily.   Yes [provider]  ?zinc gluconate 50 MG tablet Take 50 mg by mouth daily.   Yes [provider]  ?Lactobacillus Rhamnosus, GG, (RA  PROBIOTIC DIGESTIVE CARE) CAPS Take 1 capsule by mouth daily. ?Patient not taking: Reported on 07/11/2021    [provider]  ? ? ?Allergies as of 08/26/2021 - Review Complete 07/11/2021  ?Allergen Reaction Noted  ? Penicillins  05/01/2011  ? Cefotetan Rash 09/22/2020  ? ? ?Family History  ?Problem Relation Age of Onset  ? Cancer Mother   ?     breast cancer  ? Breast cancer Mother 37  ?     metastic  ? Cancer Father   ?     pancreatic  ? Hepatitis C Sister   ? Thyroid disease Sister   ? Irritable bowel syndrome Sister   ? Allergies Sister   ? ? ?Social History  ? ?Socioeconomic History  ? Marital status: Married  ?  Spouse name: [name]  ? Number of children: 3  ? Years of education: Not on file  ? Highest education level: Bachelor's degree (e.g., BA, AB, BS)  ?Occupational History  ? Occupation: Retired  ?Tobacco Use  ? Smoking status: Never  ? Smokeless tobacco: Never  ? Tobacco comments:  ?  smoking cessation materials not required  ?Vaping Use  ? Vaping Use: Never used  ?Substance and Sexual Activity  ? Alcohol use: No  ? Drug use: No  ? Sexual activity: Not Currently  ?Other Topics Concern  ? Not  on file  ?Social History Narrative  ? Not on file  ? ?Social Determinants of Health  ? ?Financial Resource Strain: Low Risk   ? Difficulty of Paying Living Expenses: Not very hard  ?Food Insecurity: No Food Insecurity  ? Worried About Charity fundraiser in the Last Year: Never true  ? Ran Out of Food in the Last Year: Never true  ?Transportation Needs: No Transportation Needs  ? Lack of Transportation (Medical): No  ? Lack of Transportation (Non-Medical): No  ?Physical Activity: Sufficiently Active  ? Days of Exercise per Week: 4 days  ? Minutes of Exercise per Session: 40 min  ?Stress: No Stress Concern Present  ? Feeling of Stress : Not at all  ?Social Connections: Moderately Integrated  ? Frequency of Communication with Friends and Family: More than three times a week  ? Frequency of Social Gatherings with  Friends and Family: Twice a week  ? Attends Religious Services: More than 4 times per year  ? Active Member of Clubs or Organizations: No  ? Attends Archivist Meetings: Never  ? Marital Status: Married  ?Intimate Partner Violence: Not At Risk  ? Fear of Current or Ex-Partner: No  ? Emotionally Abused: No  ? Physically Abused: No  ? Sexually Abused: No  ? ? ?Review of Systems: ?See HPI, otherwise negative ROS ? ?Physical Exam: ?BP 128/66   Pulse (!) 57   Temp 97.7 ?F (36.5 ?C) (Temporal)   Ht 5' 5.5" (1.664 m)   Wt 72.8 kg   SpO2 97%   BMI 26.32 kg/m?  ?General:   Alert, cooperative in NAD ?Head:  Normocephalic and atraumatic. ?Respiratory:  Normal work of breathing. ?Cardiovascular:  RRR ? ?Impression/Plan: ?Shirley Lowe is here for cataract surgery. ? ?Risks, benefits, limitations, and alternatives regarding cataract surgery have been reviewed with the patient.  Questions have been answered.  All parties agreeable. ? ? ?Birder Robson, MD  09/13/2021, 7:51 AM ? ? ?

## 2021-09-13 NOTE — Anesthesia Preprocedure Evaluation (Signed)
Anesthesia Evaluation  ?Patient identified by MRN, date of birth, ID band ?Patient awake ? ? ? ?Reviewed: ?Allergy & Precautions, NPO status  ? ?Airway ?Mallampati: II ? ?TM Distance: >3 FB ? ? ? ? Dental ?  ?Pulmonary ?neg pulmonary ROS,  ?  ?Pulmonary exam normal ? ? ? ? ? ? ? Cardiovascular ?negative cardio ROS ? ? ?Rhythm:Regular Rate:Normal ? ? ?  ?Neuro/Psych ?  ? GI/Hepatic ?Ulcerative colitis ?  ?Endo/Other  ? ? Renal/GU ?  ? ?  ?Musculoskeletal ? ? Abdominal ?  ?Peds ? Hematology ?  ?Anesthesia Other Findings ? ? Reproductive/Obstetrics ? ?  ? ? ? ? ? ? ? ? ? ? ? ? ? ?  ?  ? ? ? ? ? ? ? ? ?Anesthesia Physical ?Anesthesia Plan ? ?ASA: 2 ? ?Anesthesia Plan: MAC  ? ?Post-op Pain Management: Minimal or no pain anticipated  ? ?Induction: Intravenous ? ?PONV Risk Score and Plan: TIVA, Midazolam and Treatment may vary due to age or medical condition ? ?Airway Management Planned: Natural Airway and Nasal Cannula ? ?Additional Equipment:  ? ?Intra-op Plan:  ? ?Post-operative Plan:  ? ?Informed Consent: I have reviewed the patients History and Physical, chart, labs and discussed the procedure including the risks, benefits and alternatives for the proposed anesthesia with the patient or authorized representative who has indicated his/her understanding and acceptance.  ? ? ? ? ? ?Plan Discussed with: CRNA ? ?Anesthesia Plan Comments:   ? ? ? ? ? ? ?Anesthesia Quick Evaluation ? ?

## 2021-09-13 NOTE — Transfer of Care (Signed)
Immediate Anesthesia Transfer of Care Note ? ?Patient: Shirley Lowe ? ?Procedure(s) Performed: CATARACT EXTRACTION PHACO AND INTRAOCULAR LENS PLACEMENT ([facility]) RIGHT RAYNOR LENS (Right: Eye) ? ?Patient Location: PACU ? ?Anesthesia Type: MAC ? ?Level of Consciousness: awake, alert  and patient cooperative ? ?Airway and Oxygen Therapy: Patient Spontanous Breathing and Patient connected to supplemental oxygen ? ?Post-op Assessment: Post-op Vital signs reviewed, Patient's Cardiovascular Status Stable, Respiratory Function Stable, Patent Airway and No signs of Nausea or vomiting ? ?Post-op Vital Signs: Reviewed and stable ? ?Complications: No notable events documented. ? ?

## 2021-09-14 ENCOUNTER — Other Ambulatory Visit: Payer: Self-pay

## 2021-09-14 ENCOUNTER — Encounter: Payer: Self-pay | Admitting: Ophthalmology

## 2021-09-21 NOTE — Discharge Instructions (Signed)

## 2021-09-27 ENCOUNTER — Ambulatory Visit: Payer: Medicare HMO | Admitting: Anesthesiology

## 2021-09-27 ENCOUNTER — Ambulatory Visit
Admission: RE | Admit: 2021-09-27 | Discharge: 2021-09-27 | Disposition: A | Discharge status: Home / Self Care | Payer: Medicare HMO | Attending: Ophthalmology | Admitting: Ophthalmology

## 2021-09-27 ENCOUNTER — Encounter
Admission: RE | Disposition: A | Discharge status: Home / Self Care | Payer: Self-pay | Source: Home / Self Care | Attending: Ophthalmology

## 2021-09-27 ENCOUNTER — Other Ambulatory Visit: Payer: Self-pay

## 2021-09-27 ENCOUNTER — Encounter: Payer: Self-pay | Admitting: Ophthalmology

## 2021-09-27 DIAGNOSIS — H2512 Age-related nuclear cataract, left eye: Secondary | ICD-10-CM | POA: Insufficient documentation

## 2021-09-27 HISTORY — PX: CATARACT EXTRACTION W/PHACO: SHX586

## 2021-09-27 SURGERY — PHACOEMULSIFICATION, CATARACT, WITH IOL INSERTION
Anesthesia: Monitor Anesthesia Care | Site: Eye | Laterality: Left

## 2021-09-27 MED ORDER — SIGHTPATH DOSE#1 NA CHONDROIT SULF-NA HYALURON 40-17 MG/ML IO SOLN
INTRAOCULAR | Status: DC | PRN
  Administered 2021-09-27: 1 mL via INTRAOCULAR

## 2021-09-27 MED ORDER — MIDAZOLAM HCL 2 MG/2ML IJ SOLN
INTRAMUSCULAR | Status: DC | PRN
  Administered 2021-09-27: 2 mg via INTRAVENOUS

## 2021-09-27 MED ORDER — EPINEPHRINE PF 1 MG/ML IJ SOLN
INTRAMUSCULAR | Status: DC | PRN
  Administered 2021-09-27: 56 mL via OPHTHALMIC

## 2021-09-27 MED ORDER — NA CHONDROIT SULF-NA HYALURON 40-17 MG/ML IO SOLN
INTRAOCULAR | Status: DC | PRN
  Administered 2021-09-27: 1 mL via INTRAOCULAR

## 2021-09-27 MED ORDER — SIGHTPATH DOSE#1 BSS IO SOLN
INTRAOCULAR | Status: DC | PRN
  Administered 2021-09-27: 1 mL

## 2021-09-27 MED ORDER — FENTANYL CITRATE (PF) 100 MCG/2ML IJ SOLN
INTRAMUSCULAR | Status: DC | PRN
  Administered 2021-09-27: 50 ug via INTRAVENOUS

## 2021-09-27 MED ORDER — ONDANSETRON HCL 4 MG/2ML IJ SOLN: 4.0000 mg | Freq: Once | INTRAMUSCULAR | Status: DC | PRN

## 2021-09-27 MED ORDER — ARMC OPHTHALMIC DILATING DROPS
1.0000 "application " | OPHTHALMIC | Status: DC | PRN
  Administered 2021-09-27 (×3): 1 via OPHTHALMIC

## 2021-09-27 MED ORDER — SIGHTPATH DOSE#1 BSS IO SOLN
INTRAOCULAR | Status: DC | PRN
  Administered 2021-09-27: 15 mL

## 2021-09-27 MED ORDER — ACETAMINOPHEN 160 MG/5ML PO SOLN: 325.0000 mg | ORAL | Status: DC | PRN

## 2021-09-27 MED ORDER — ACETAMINOPHEN 325 MG PO TABS: 325.0000 mg | ORAL_TABLET | ORAL | Status: DC | PRN

## 2021-09-27 MED ORDER — MOXIFLOXACIN HCL 0.5 % OP SOLN
OPHTHALMIC | Status: DC | PRN
  Administered 2021-09-27: 0.2 mL via OPHTHALMIC

## 2021-09-27 MED ORDER — BRIMONIDINE TARTRATE-TIMOLOL 0.2-0.5 % OP SOLN
OPHTHALMIC | Status: DC | PRN
Start: 2021-09-27 — End: 2021-09-27
  Administered 2021-09-27: 1 [drp] via OPHTHALMIC

## 2021-09-27 MED ORDER — TETRACAINE HCL 0.5 % OP SOLN
1.0000 [drp] | OPHTHALMIC | Status: DC | PRN
  Administered 2021-09-27 (×3): 1 [drp] via OPHTHALMIC

## 2021-09-27 SURGICAL SUPPLY — 17 items
CANNULA ANT/CHMB 27G (MISCELLANEOUS) IMPLANT
CANNULA ANT/CHMB 27GA (MISCELLANEOUS) IMPLANT
CATARACT SUITE SIGHTPATH (MISCELLANEOUS) ×2 IMPLANT
FEE CATARACT SUITE SIGHTPATH (MISCELLANEOUS) ×1 IMPLANT
GLOVE SURG ENC TEXT LTX SZ8 (GLOVE) ×2 IMPLANT
GLOVE SURG TRIUMPH 8.0 PF LTX (GLOVE) ×2 IMPLANT
LENS IOL RAYNER 23.5 (Intraocular Lens) ×2 IMPLANT
LENS IOL RAYONE EMV 23.5 (Intraocular Lens) IMPLANT
NDL FILTER BLUNT 18X1 1/2 (NEEDLE) ×1 IMPLANT
NEEDLE FILTER BLUNT 18X 1/2SAF (NEEDLE) ×1
NEEDLE FILTER BLUNT 18X1 1/2 (NEEDLE) ×1 IMPLANT
PACK VIT ANT 23G (MISCELLANEOUS) IMPLANT
RING MALYGIN (MISCELLANEOUS) IMPLANT
SUT ETHILON 10-0 CS-B-6CS-B-6 (SUTURE)
SUTURE EHLN 10-0 CS-B-6CS-B-6 (SUTURE) IMPLANT
SYR 3ML LL SCALE MARK (SYRINGE) ×2 IMPLANT
WATER STERILE IRR 250ML POUR (IV SOLUTION) ×2 IMPLANT

## 2021-09-27 NOTE — H&P (Signed)
[place]   Primary Care Physician:  Charlynne Cousins, MD Ophthalmologist: Dr.Kila Godina  Pre-Procedure History & Physical: HPI:  Shirley Lowe is a 71 y.o. female here for cataract surgery.   Past Medical History:  Diagnosis Date   Diverticulitis    Diverticulitis    Dyspnea    with exerton due to out of shape per pt    Osteoporosis    Ulcerative colitis     Past Surgical History:  Procedure Laterality Date   ABDOMINAL HYSTERECTOMY     CATARACT EXTRACTION W/PHACO Right 09/13/2021   Procedure: CATARACT EXTRACTION PHACO AND INTRAOCULAR LENS PLACEMENT ([place]) RIGHT RAYNOR LENS;  Surgeon: Birder Robson, MD;  Location: [facility];  Service: Ophthalmology;  Laterality: Right;  5.32 00:40.7   COLON RESECTION SIGMOID N/A    COLONOSCOPY WITH PROPOFOL N/A 11/12/2019   Procedure: COLONOSCOPY WITH PROPOFOL;  Surgeon: Virgel Manifold, MD;  Location: [facility] ENDOSCOPY;  Service: Endoscopy;  Laterality: N/A;   INCONTINENCE SURGERY     TONSILLECTOMY     TONSILLECTOMY      Prior to Admission medications   Medication Sig Start Date End Date Taking? Authorizing Provider  acetaminophen (TYLENOL) 500 MG tablet Take 2 tablets (1,000 mg total) by mouth every 6 (six) hours. 8/46/96  Yes Leighton Ruff, MD  ascorbic acid (VITAMIN C) 1000 MG tablet Take 1,000 mg by mouth daily.   Yes [provider]  B Complex Vitamins (VITAMIN B-COMPLEX) TABS Take 1 tablet by mouth daily.   Yes [provider]  calcium carbonate (OSCAL) 1500 (600 Ca) MG TABS tablet Take 600 mg of elemental calcium by mouth daily.   Yes [provider]  Cholecalciferol (VITAMIN D) 50 MCG (2000 UT) CAPS Take 1 capsule (2,000 Units total) by mouth daily. 08/28/19  Yes Sowles, Drue Stager, MD  Lactobacillus Rhamnosus, GG, (RA PROBIOTIC DIGESTIVE CARE) CAPS Take 1 capsule by mouth daily.   Yes [provider]  mesalamine (LIALDA) 1.2 g EC tablet TAKE 1 TABLET (1.2 G TOTAL) BY MOUTH IN  THE MORNING AND AT BEDTIME. Strength: 1.2 g 08/04/21  Yes Jonathon Bellows, MD  Omega-3 Fatty Acids (FISH OIL) 1000 MG CAPS Take 1,000 mg by mouth daily.   Yes [provider]  vitamin E 100 UNIT capsule Take 100 Units by mouth daily.   Yes [provider]  zinc gluconate 50 MG tablet Take 50 mg by mouth daily.   Yes [provider]    Allergies as of 08/26/2021 - Review Complete 07/11/2021  Allergen Reaction Noted   Penicillins  05/01/2011   Cefotetan Rash 09/22/2020    Family History  Problem Relation Age of Onset   Cancer Mother        breast cancer   Breast cancer Mother 65       metastic   Cancer Father        pancreatic   Hepatitis C Sister    Thyroid disease Sister    Irritable bowel syndrome Sister    Allergies Sister     Social History   Socioeconomic History   Marital status: Married    Spouse name: [name]   Number of children: 3   Years of education: Not on file   Highest education level: Bachelor's degree (e.g., BA, AB, BS)  Occupational History   Occupation: Retired  Tobacco Use   Smoking status: Never   Smokeless tobacco: Never   Tobacco comments:    smoking cessation materials not required  Vaping Use  Vaping Use: Never used  Substance and Sexual Activity   Alcohol use: No   Drug use: No   Sexual activity: Not Currently  Other Topics Concern   Not on file  Social History Narrative   Not on file   Social Determinants of Health   Financial Resource Strain: Low Risk    Difficulty of Paying Living Expenses: Not very hard  Food Insecurity: No Food Insecurity   Worried About Running Out of Food in the Last Year: Never true   Ran Out of Food in the Last Year: Never true  Transportation Needs: No Transportation Needs   Lack of Transportation (Medical): No   Lack of Transportation (Non-Medical): No  Physical Activity: Sufficiently Active   Days of Exercise per Week: 4 days   Minutes of Exercise per Session: 40 min  Stress: No  Stress Concern Present   Feeling of Stress : Not at all  Social Connections: Moderately Integrated   Frequency of Communication with Friends and Family: More than three times a week   Frequency of Social Gatherings with Friends and Family: Twice a week   Attends Religious Services: More than 4 times per year   Active Member of [employer] or Organizations: No   Attends Music therapist: Never   Marital Status: Married  Human resources officer Violence: Not At Risk   Fear of Current or Ex-Partner: No   Emotionally Abused: No   Physically Abused: No   Sexually Abused: No    Review of Systems: See HPI, otherwise negative ROS  Physical Exam: BP (!) 130/44   Pulse (!) 51   Temp (!) 96.8 F (36 C) (Temporal)   Resp 16   Wt 72.6 kg   SpO2 98%   BMI 26.22 kg/m  General:   Alert, cooperative in NAD Head:  Normocephalic and atraumatic. Respiratory:  Normal work of breathing. Cardiovascular:  RRR  Impression/Plan: Shirley Lowe is here for cataract surgery.  Risks, benefits, limitations, and alternatives regarding cataract surgery have been reviewed with the patient.  Questions have been answered.  All parties agreeable.   Birder Robson, MD  09/27/2021, 11:58 AM

## 2021-09-27 NOTE — Transfer of Care (Signed)
Immediate Anesthesia Transfer of Care Note  Patient: Shirley Lowe  Procedure(s) Performed: CATARACT EXTRACTION PHACO AND INTRAOCULAR LENS PLACEMENT ([facility]) LEFT RAYNOR LENS (Left: Eye)  Patient Location: PACU  Anesthesia Type: MAC  Level of Consciousness: awake, alert  and patient cooperative  Airway and Oxygen Therapy: Patient Spontanous Breathing and Patient connected to supplemental oxygen  Post-op Assessment: Post-op Vital signs reviewed, Patient's Cardiovascular Status Stable, Respiratory Function Stable, Patent Airway and No signs of Nausea or vomiting  Post-op Vital Signs: Reviewed and stable  Complications: No notable events documented.

## 2021-09-27 NOTE — Anesthesia Postprocedure Evaluation (Signed)
Anesthesia Post Note  Patient: Shirley Lowe  Procedure(s) Performed: CATARACT EXTRACTION PHACO AND INTRAOCULAR LENS PLACEMENT ([facility]) LEFT RAYNOR LENS (Left: Eye)     Patient location during evaluation: PACU Anesthesia Type: MAC Level of consciousness: awake Pain management: pain level controlled Vital Signs Assessment: post-procedure vital signs reviewed and stable Respiratory status: respiratory function stable Cardiovascular status: stable Postop Assessment: no apparent nausea or vomiting Anesthetic complications: no   No notable events documented.  Veda Canning

## 2021-09-27 NOTE — Anesthesia Preprocedure Evaluation (Signed)
Anesthesia Evaluation  Patient identified by MRN, date of birth, ID band Patient awake    Reviewed: Allergy & Precautions, NPO status   Airway Mallampati: II  TM Distance: >3 FB     Dental   Pulmonary neg pulmonary ROS,    Pulmonary exam normal        Cardiovascular negative cardio ROS   Rhythm:Regular Rate:Normal     Neuro/Psych    GI/Hepatic Ulcerative colitis   Endo/Other    Renal/GU      Musculoskeletal   Abdominal   Peds  Hematology   Anesthesia Other Findings   Reproductive/Obstetrics                             Anesthesia Physical  Anesthesia Plan  ASA: 2  Anesthesia Plan: MAC   Post-op Pain Management: Minimal or no pain anticipated   Induction: Intravenous  PONV Risk Score and Plan: TIVA, Midazolam and Treatment may vary due to age or medical condition  Airway Management Planned: Natural Airway and Nasal Cannula  Additional Equipment:   Intra-op Plan:   Post-operative Plan:   Informed Consent: I have reviewed the patients History and Physical, chart, labs and discussed the procedure including the risks, benefits and alternatives for the proposed anesthesia with the patient or authorized representative who has indicated his/her understanding and acceptance.       Plan Discussed with: CRNA  Anesthesia Plan Comments:         Anesthesia Quick Evaluation

## 2021-09-27 NOTE — Op Note (Signed)
PREOPERATIVE DIAGNOSIS:  Nuclear sclerotic cataract of the left eye.   POSTOPERATIVE DIAGNOSIS:  Nuclear sclerotic cataract of the left eye.   OPERATIVE PROCEDURE:ORPROCALL@   SURGEON:  Shirley Robson, MD.   ANESTHESIA:  Anesthesiologist: Veda Canning, MD CRNA: Silvana Newness, CRNA  1.      Managed anesthesia care. 2.     0.22m of Shugarcaine was instilled following the paracentesis   COMPLICATIONS:  None.   TECHNIQUE:   Stop and chop   DESCRIPTION OF PROCEDURE:  The patient was examined and consented in the preoperative holding area where the aforementioned topical anesthesia was applied to the left eye and then brought back to the Operating Room where the left eye was prepped and draped in the usual sterile ophthalmic fashion and a lid speculum was placed. A paracentesis was created with the side port blade and the anterior chamber was filled with viscoelastic. A near clear corneal incision was performed with the steel keratome. A continuous curvilinear capsulorrhexis was performed with a cystotome followed by the capsulorrhexis forceps. Hydrodissection and hydrodelineation were carried out with BSS on a blunt cannula. The lens was removed in a stop and chop  technique and the remaining cortical material was removed with the irrigation-aspiration handpiece. The capsular bag was inflated with viscoelastic and the Technis [id number] lens was placed in the capsular bag without complication. The remaining viscoelastic was removed from the eye with the irrigation-aspiration handpiece. The wounds were hydrated. The anterior chamber was flushed with BSS and the eye was inflated to physiologic pressure. 0.175mVigamox was placed in the anterior chamber. The wounds were found to be water tight. The eye was dressed with Combigan. The patient was given protective glasses to wear throughout the day and a shield with which to sleep tonight. The patient was also given drops with which to begin a drop regimen  today and will follow-up with me in one day. Implant Name Type Inv. Item Serial No. Manufacturer Lot No. LRB No. Used Action  LENS IOL RAYNER 23.5 - [id number] Lens LENS IOL RAYNER 23.5  [employer] [id number] Implanted    Procedure(s) with comments: CATARACT EXTRACTION PHACO AND INTRAOCULAR LENS PLACEMENT ([facility]) LEFT RAYNOR LENS (Left) - 4.79 0:38.5  Electronically signed: WiBirder Lowe/30/2023 12:27 PM

## 2021-09-28 ENCOUNTER — Encounter: Payer: Self-pay | Admitting: Ophthalmology

## 2021-10-20 ENCOUNTER — Ambulatory Visit
Admission: RE | Admit: 2021-10-20 | Discharge: 2021-10-20 | Disposition: A | Discharge status: Home / Self Care | Payer: Medicare HMO | Source: Ambulatory Visit | Attending: Internal Medicine | Admitting: Internal Medicine

## 2021-10-20 DIAGNOSIS — Z13820 Encounter for screening for osteoporosis: Secondary | ICD-10-CM | POA: Diagnosis present

## 2021-10-20 DIAGNOSIS — M810 Age-related osteoporosis without current pathological fracture: Secondary | ICD-10-CM | POA: Insufficient documentation

## 2021-10-20 DIAGNOSIS — Z1231 Encounter for screening mammogram for malignant neoplasm of breast: Secondary | ICD-10-CM | POA: Diagnosis present

## 2021-10-20 DIAGNOSIS — Z780 Asymptomatic menopausal state: Secondary | ICD-10-CM | POA: Insufficient documentation

## 2021-10-20 IMAGING — MG MM DIGITAL SCREENING BILAT W/ TOMO AND CAD
8 series · 9 of 24 positions shown · non-contrast
Comparison: Previous exam(s).

CLINICAL DATA: Screening.

EXAM:
DIGITAL SCREENING BILATERAL MAMMOGRAM WITH TOMOSYNTHESIS AND CAD
TECHNIQUE: Bilateral screening digital craniocaudal and mediolateral oblique
mammograms were obtained. Bilateral screening digital breast
tomosynthesis was performed. The images were evaluated with
computer-aided detection.

[L MLO synth-2D]
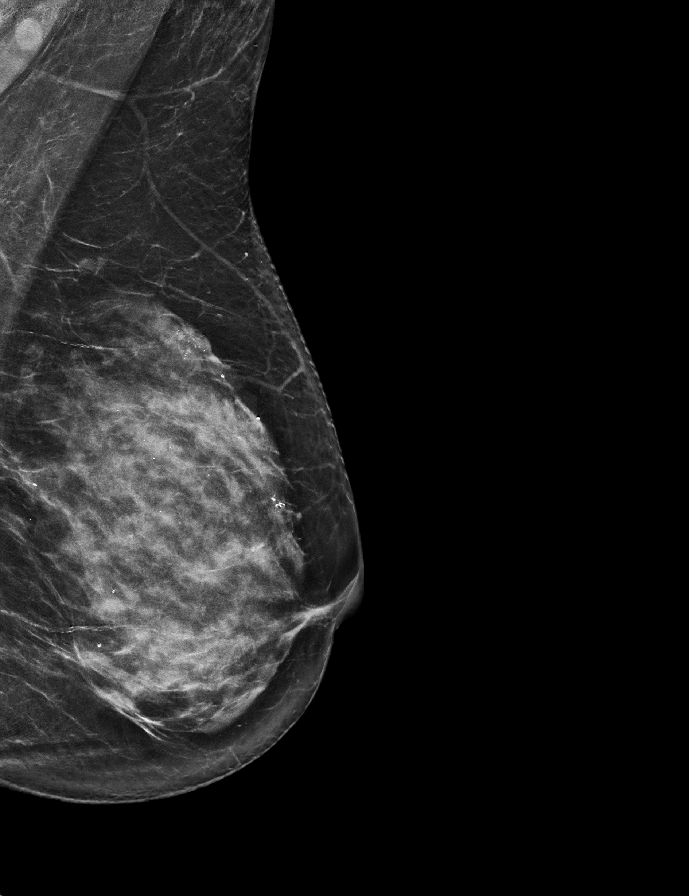

[L CC synth-2D]
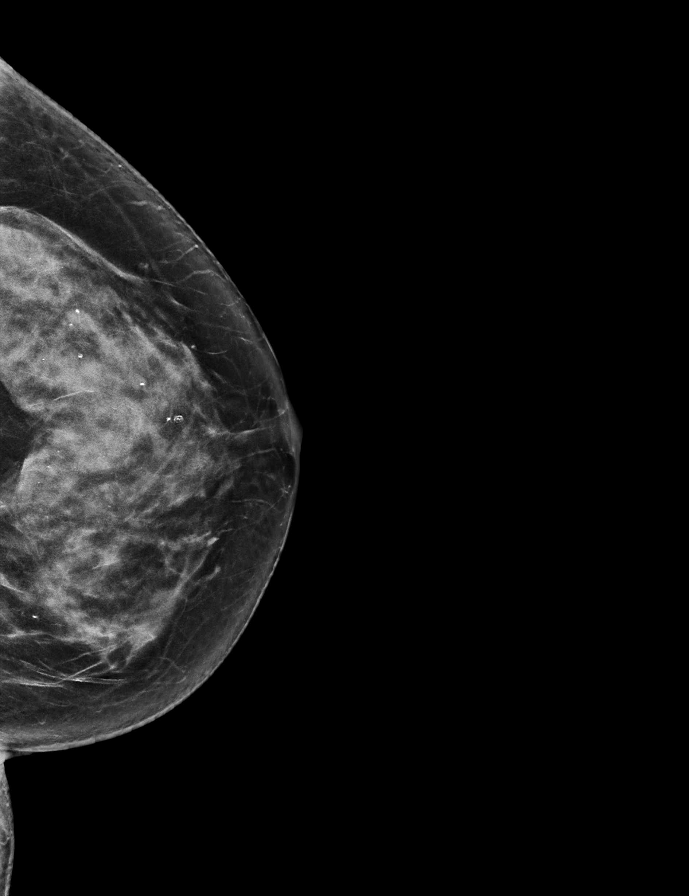

[R CC synth-2D]
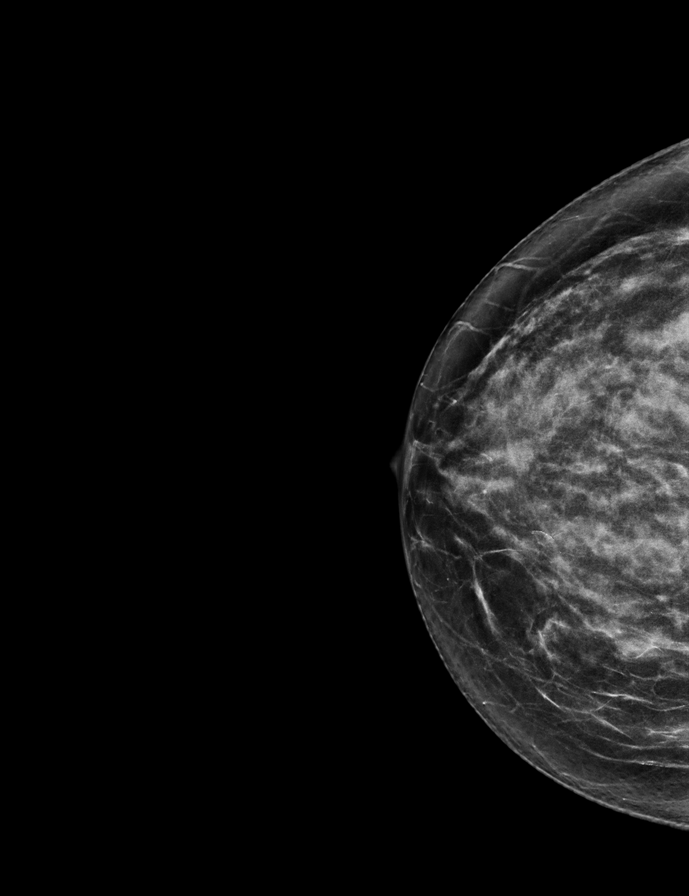

[R MLO synth-2D]
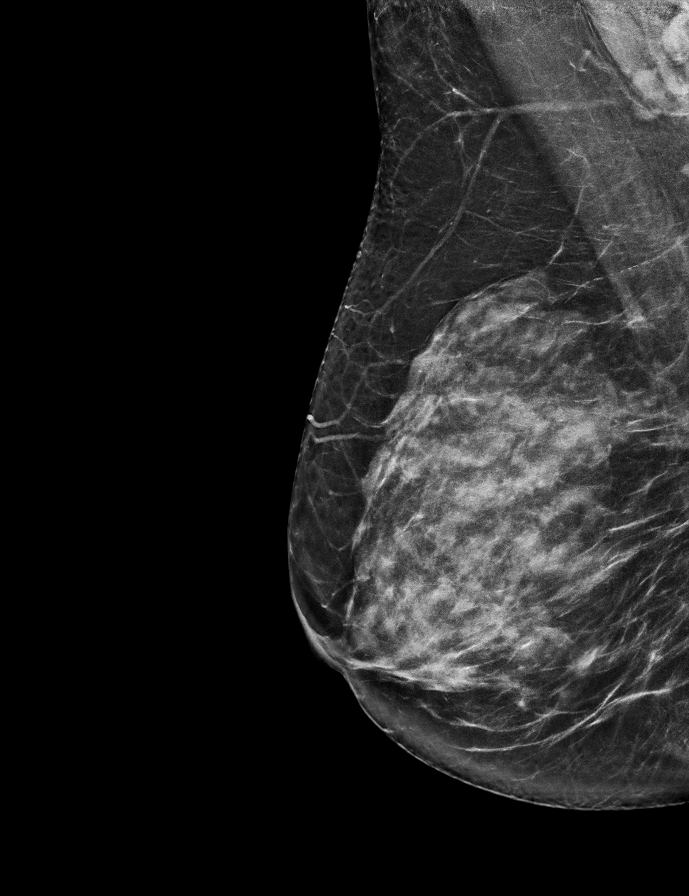

[R CC tomo · 2 of 69 frames shown]
[frame 23/69]
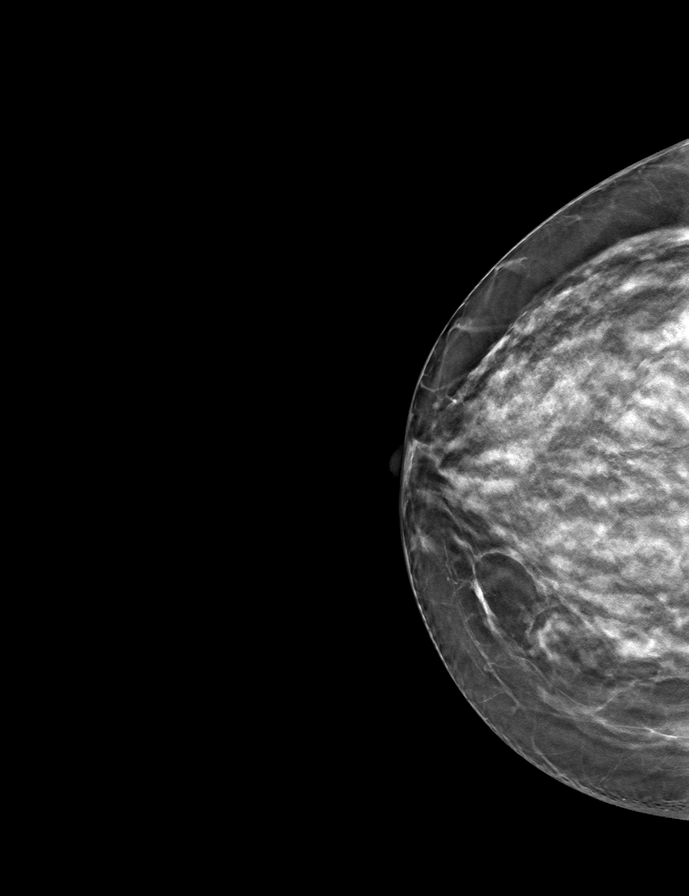
[frame 35/69]
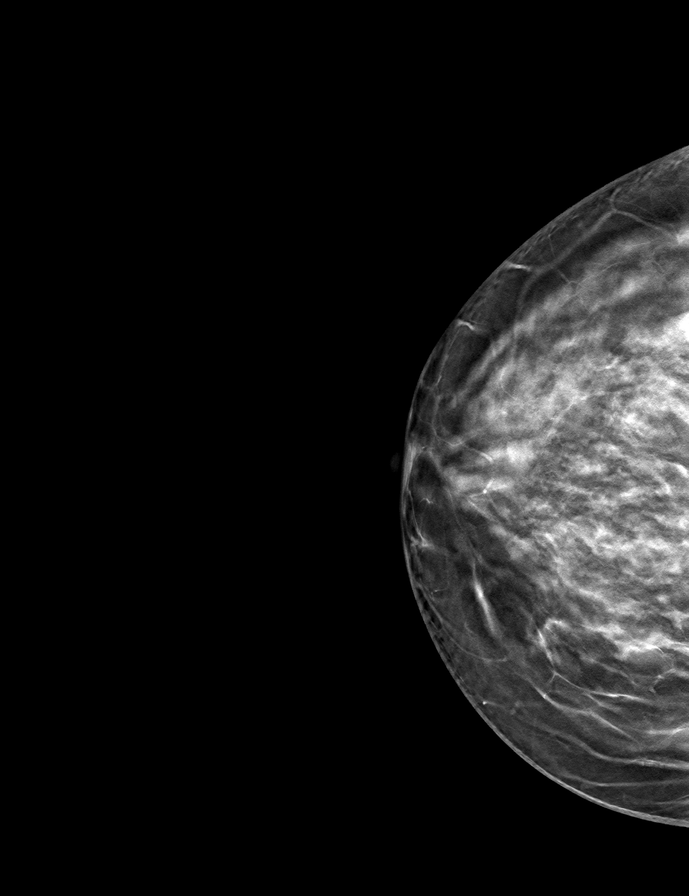

[R MLO tomo · tomo slice 39/77.0]
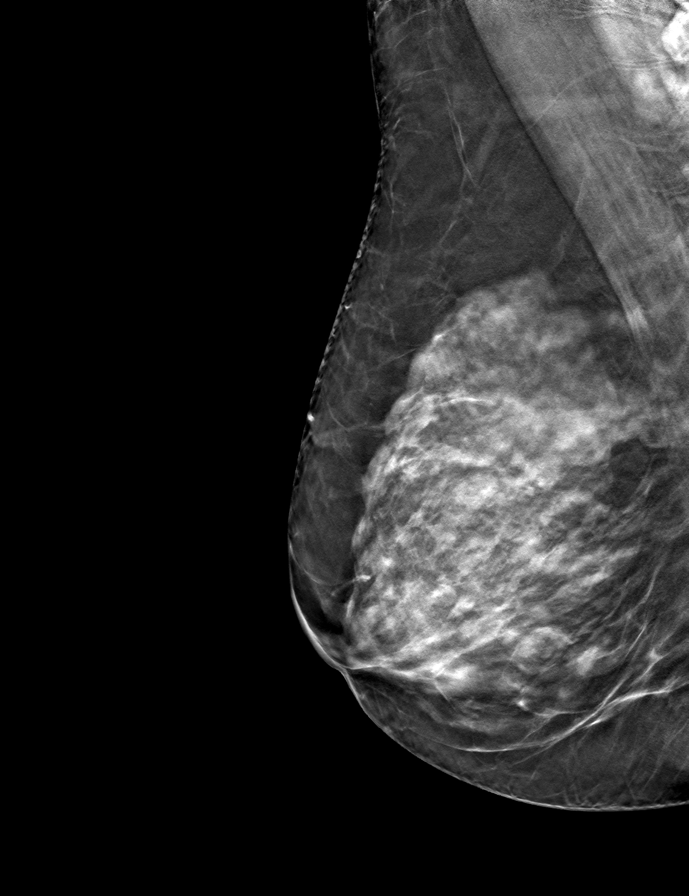

[L CC tomo · tomo slice 35/70.0]
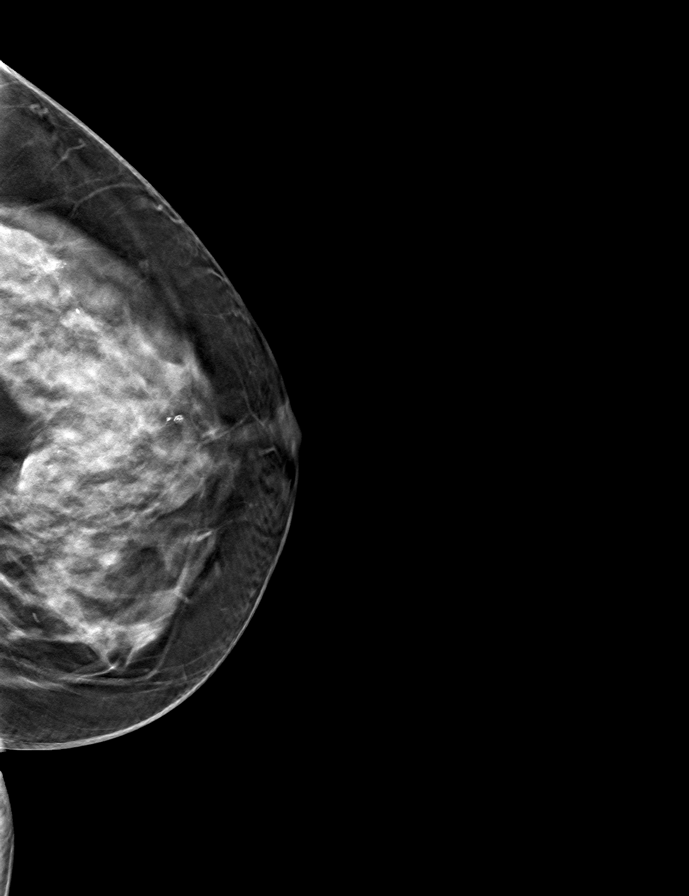

[L MLO tomo · tomo slice 38/75.0]
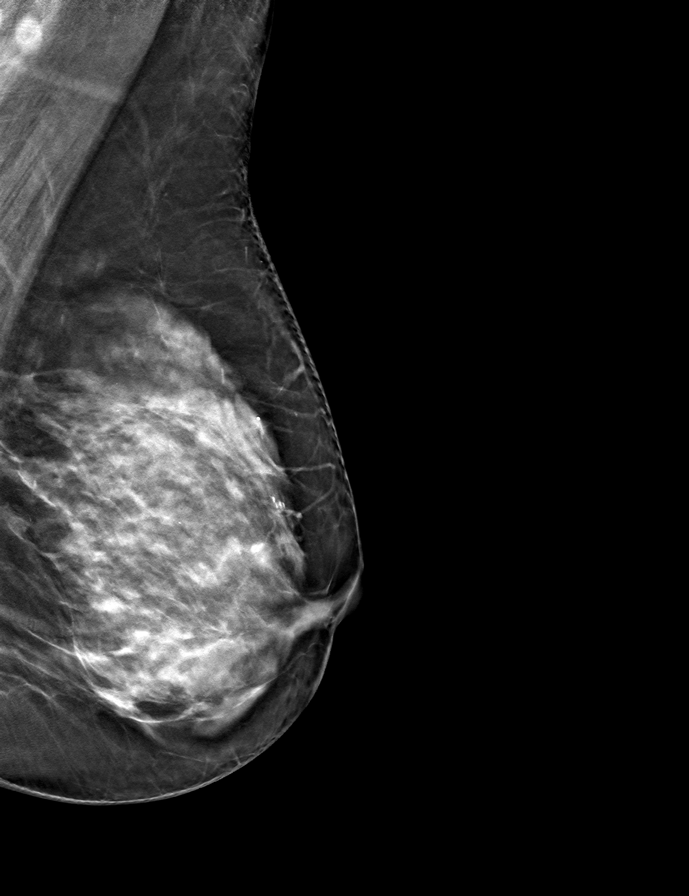

[9 of 24 positions shown; findings below may reference images not displayed]

ACR Breast Density Category c: The breast tissue is heterogeneously
dense, which may obscure small masses.
FINDINGS: In the left breast, calcifications warrant further evaluation. In
the right breast, no findings suspicious for malignancy.
IMPRESSION: Further evaluation is suggested for calcifications in the left
breast.

RECOMMENDATION:
Diagnostic mammogram of the left breast. (Code:[id number]-B-FFA)

The patient will be contacted regarding the findings, and additional
imaging will be scheduled.

BI-RADS CATEGORY  0: Incomplete. Need additional imaging evaluation
and/or prior mammograms for comparison.

## 2021-10-21 ENCOUNTER — Encounter: Payer: Self-pay | Admitting: Internal Medicine

## 2021-10-21 ENCOUNTER — Other Ambulatory Visit: Payer: Self-pay | Admitting: Internal Medicine

## 2021-10-21 DIAGNOSIS — R921 Mammographic calcification found on diagnostic imaging of breast: Secondary | ICD-10-CM

## 2021-10-21 DIAGNOSIS — R928 Other abnormal and inconclusive findings on diagnostic imaging of breast: Secondary | ICD-10-CM

## 2021-10-24 ENCOUNTER — Other Ambulatory Visit: Payer: Self-pay | Admitting: Gastroenterology

## 2021-10-24 DIAGNOSIS — K51311 Ulcerative (chronic) rectosigmoiditis with rectal bleeding: Secondary | ICD-10-CM

## 2021-10-26 NOTE — Progress Notes (Signed)
Has osteoporosis not sure if this is new, please ask pt if she knew about this in the past, will need to see new provider and d/w them reg meds for such -- Needs an appt to discuss options for osteoporosis.

## 2021-11-14 ENCOUNTER — Ambulatory Visit
Admission: RE | Admit: 2021-11-14 | Discharge: 2021-11-14 | Disposition: A | Discharge status: Home / Self Care | Payer: Medicare HMO | Source: Ambulatory Visit | Attending: Internal Medicine | Admitting: Internal Medicine

## 2021-11-14 DIAGNOSIS — R928 Other abnormal and inconclusive findings on diagnostic imaging of breast: Secondary | ICD-10-CM

## 2021-11-14 DIAGNOSIS — R921 Mammographic calcification found on diagnostic imaging of breast: Secondary | ICD-10-CM

## 2021-11-15 ENCOUNTER — Other Ambulatory Visit: Payer: Self-pay | Admitting: Physician Assistant

## 2021-11-15 ENCOUNTER — Other Ambulatory Visit: Payer: Self-pay | Admitting: Nurse Practitioner

## 2021-11-15 DIAGNOSIS — R928 Other abnormal and inconclusive findings on diagnostic imaging of breast: Secondary | ICD-10-CM

## 2021-11-15 DIAGNOSIS — R921 Mammographic calcification found on diagnostic imaging of breast: Secondary | ICD-10-CM

## 2021-11-28 ENCOUNTER — Other Ambulatory Visit: Payer: Self-pay

## 2021-11-28 ENCOUNTER — Ambulatory Visit: Payer: Medicare HMO | Admitting: Gastroenterology

## 2021-11-29 ENCOUNTER — Encounter: Payer: Self-pay | Admitting: Gastroenterology

## 2021-11-29 ENCOUNTER — Ambulatory Visit: Payer: Medicare HMO | Admitting: Gastroenterology

## 2021-11-29 DIAGNOSIS — D0512 Intraductal carcinoma in situ of left breast: Secondary | ICD-10-CM | POA: Insufficient documentation

## 2021-11-29 DIAGNOSIS — K5190 Ulcerative colitis, unspecified, without complications: Secondary | ICD-10-CM

## 2021-11-29 MED ORDER — MESALAMINE 1.2 G PO TBEC: DELAYED_RELEASE_TABLET | ORAL | 3 refills | Status: DC

## 2021-11-29 NOTE — Progress Notes (Signed)
Cephas Darby, MD [address]  [place]  [place], [place] [address]  Main: [phone]  Fax: [phone]    Gastroenterology Consultation  Referring Provider:     Charlynne Cousins, MD Primary Care Physician:  Charlynne Cousins, MD Primary Gastroenterologist:  Dr. Bonna Gains Reason for Consultation: Ulcerative colitis        HPI:   Shirley Lowe is a 71 y.o. female referred by Dr. Charlynne Cousins, MD  for consultation & management of ulcerative colitis, currently maintained on Lialda 1.2 g twice daily.  Patient is seen clinical and endoscopic remission. She had history of perforated sigmoid diverticulitis s/p sigmoid colectomy on 09/22/2020.  Patient has been doing well since surgery.  She reports having regular bowel movements.  She is here to establish care for Lialda prescription.  NSAIDs: None  Antiplts/Anticoagulants/Anti thrombotics: None  GI Procedures:  Colonoscopy at [facility] 04/14/2020  - Diverticulosis in the entire examined colon.                         - Stricture in the sigmoid colon. Likely due to                         history of sigmoid colon diverticulitis. Only                         traversible with a neonatal endoscope.                         - Internal hemorrhoids.                         - The examination was otherwise normal. No signs of                         ulcerative colitis.                         - No specimens collected.   Past Medical History:  Diagnosis Date   Diverticulitis    Diverticulitis    Dyspnea    with exerton due to out of shape per pt    Osteoporosis    Ulcerative colitis     Past Surgical History:  Procedure Laterality Date   ABDOMINAL HYSTERECTOMY     CATARACT EXTRACTION W/PHACO Right 09/13/2021   Procedure: CATARACT EXTRACTION PHACO AND INTRAOCULAR LENS PLACEMENT ([place]) RIGHT RAYNOR LENS;  Surgeon: Birder Robson, MD;  Location: [place];  Service: Ophthalmology;  Laterality: Right;  5.32 00:40.7    CATARACT EXTRACTION W/PHACO Left 09/27/2021   Procedure: CATARACT EXTRACTION PHACO AND INTRAOCULAR LENS PLACEMENT ([place]) LEFT RAYNOR LENS;  Surgeon: Birder Robson, MD;  Location: [place];  Service: Ophthalmology;  Laterality: Left;  4.79 0:38.5   COLON RESECTION SIGMOID N/A    COLONOSCOPY WITH PROPOFOL N/A 11/12/2019   Procedure: COLONOSCOPY WITH PROPOFOL;  Surgeon: Virgel Manifold, MD;  Location: [facility] ENDOSCOPY;  Service: Endoscopy;  Laterality: N/A;   INCONTINENCE SURGERY     TONSILLECTOMY     TONSILLECTOMY       Current Outpatient Medications:    acetaminophen (TYLENOL) 500 MG tablet, Take 2 tablets (1,000 mg total) by mouth every 6 (six) hours., Disp: 30 tablet, Rfl: 0   ascorbic acid (VITAMIN C) 1000 MG tablet,  Take 1,000 mg by mouth daily., Disp: , Rfl:    B Complex Vitamins (VITAMIN B-COMPLEX) TABS, Take 1 tablet by mouth daily., Disp: , Rfl:    calcium carbonate (OSCAL) 1500 (600 Ca) MG TABS tablet, Take 600 mg of elemental calcium by mouth daily., Disp: , Rfl:    Cholecalciferol (VITAMIN D) 50 MCG (2000 UT) CAPS, Take 1 capsule (2,000 Units total) by mouth daily., Disp: 30 capsule, Rfl: 0   [employer]S CRANBERRY 500 MG CAPS, , Disp: , Rfl:    Lactobacillus Rhamnosus, GG, (RA PROBIOTIC DIGESTIVE CARE) CAPS, Take 1 capsule by mouth daily., Disp: , Rfl:    Menatetrenone (VITAMIN K2) 100 MCG TABS, Take by mouth., Disp: , Rfl:    Omega-3 Fatty Acids (FISH OIL) 1000 MG CAPS, Take 1,000 mg by mouth daily., Disp: , Rfl:    vitamin E 100 UNIT capsule, Take 100 Units by mouth daily., Disp: , Rfl:    zinc gluconate 50 MG tablet, Take 50 mg by mouth daily., Disp: , Rfl:    mesalamine (LIALDA) 1.2 g EC tablet, TAKE 1 TABLET BY MOUTH IN THE MORNING AND AT BEDTIME, Disp: 180 tablet, Rfl: 3   Family History  Problem Relation Age of Onset   Cancer Mother        breast cancer   Breast cancer Mother 68       metastic   Cancer Father        pancreatic   Hepatitis C Sister     Thyroid disease Sister    Irritable bowel syndrome Sister    Allergies Sister      Social History   Tobacco Use   Smoking status: Never   Smokeless tobacco: Never   Tobacco comments:    smoking cessation materials not required  Vaping Use   Vaping Use: Never used  Substance Use Topics   Alcohol use: No   Drug use: No    Allergies as of 11/29/2021 - Review Complete 11/29/2021  Allergen Reaction Noted   Penicillins  05/01/2011    Review of Systems:    All systems reviewed and negative except where noted in HPI.   Physical Exam:  BP 128/73 (BP Location: Left Arm, Patient Position: Sitting, Cuff Size: Normal)   Pulse (!) 53   Temp 98.3 F (36.8 C) (Oral)   Ht 5' 5.5" (1.664 m)   Wt 158 lb (71.7 kg)   BMI 25.89 kg/m  No LMP recorded. Patient has had a hysterectomy.  General:   Alert,  Well-developed, well-nourished, pleasant and cooperative in NAD Head:  Normocephalic and atraumatic. Eyes:  Sclera clear, no icterus.   Conjunctiva pink. Ears:  Normal auditory acuity. Nose:  No deformity, discharge, or lesions. Mouth:  No deformity or lesions,oropharynx pink & moist. Neck:  Supple; no masses or thyromegaly. Lungs:  Respirations even and unlabored.  Clear throughout to auscultation.   No wheezes, crackles, or rhonchi. No acute distress. Heart:  Regular rate and rhythm; no murmurs, clicks, rubs, or gallops. Abdomen:  Normal bowel sounds. Soft, non-tender and non-distended without masses, hepatosplenomegaly or hernias noted.  No guarding or rebound tenderness.   Rectal: Not performed Msk:  Symmetrical without gross deformities. Good, equal movement & strength bilaterally. Pulses:  Normal pulses noted. Extremities:  No clubbing or edema.  No cyanosis. Neurologic:  Alert and oriented x3;  grossly normal neurologically. Skin:  Intact without significant lesions or rashes. No jaundice. Psych:  Alert and cooperative. Normal mood and affect.  Imaging  Studies:  Reviewed  Assessment and Plan:   Shirley Lowe is a 71 y.o. pleasant Caucasian female with history of ulcerative colitis maintained on Lialda 1.2 g twice daily, history of perforated sigmoid diverticulitis s/p sigmoid resection in 08/2020.  Patient is currently doing well.  Continue Lialda 1.2 g twice daily   Follow up annually   Cephas Darby, MD

## 2021-12-01 ENCOUNTER — Ambulatory Visit
Admission: RE | Admit: 2021-12-01 | Discharge: 2021-12-01 | Disposition: A | Discharge status: Home / Self Care | Payer: Medicare HMO | Source: Ambulatory Visit | Attending: Nurse Practitioner | Admitting: Nurse Practitioner

## 2021-12-01 DIAGNOSIS — R928 Other abnormal and inconclusive findings on diagnostic imaging of breast: Secondary | ICD-10-CM

## 2021-12-01 DIAGNOSIS — R921 Mammographic calcification found on diagnostic imaging of breast: Secondary | ICD-10-CM | POA: Diagnosis present

## 2021-12-01 HISTORY — PX: BREAST BIOPSY: SHX20

## 2021-12-02 ENCOUNTER — Encounter: Payer: Self-pay | Admitting: *Deleted

## 2021-12-02 LAB — SURGICAL PATHOLOGY

## 2021-12-02 NOTE — Progress Notes (Signed)
Received referral for newly diagnosed breast cancer from [facility]iology.  Navigation initiated.  Patient would like to discuss which MD's she would like to see with her family.  She will call back and let me know who she'd like to see.

## 2021-12-05 ENCOUNTER — Encounter: Payer: Self-pay | Admitting: *Deleted

## 2021-12-05 NOTE — Progress Notes (Signed)
Patient has been scheduled for surgical consult with Dr. Bary Castilla on 8/10 at 11:30.  She would like to discuss with Dr. Bary Castilla prior to scheduling a medical oncology consult.

## 2021-12-13 ENCOUNTER — Encounter: Payer: Self-pay | Admitting: Internal Medicine

## 2021-12-28 DIAGNOSIS — D499 Neoplasm of unspecified behavior of unspecified site: Secondary | ICD-10-CM | POA: Insufficient documentation

## 2021-12-29 ENCOUNTER — Encounter: Payer: Self-pay | Admitting: *Deleted

## 2022-01-05 ENCOUNTER — Ambulatory Visit: Payer: Medicare HMO | Admitting: Internal Medicine

## 2022-01-09 ENCOUNTER — Encounter: Payer: Self-pay | Admitting: Physician Assistant

## 2022-01-09 ENCOUNTER — Ambulatory Visit (INDEPENDENT_AMBULATORY_CARE_PROVIDER_SITE_OTHER): Payer: Medicare HMO | Admitting: Physician Assistant

## 2022-01-09 VITALS — BP 118/76 | HR 55 | Temp 98.6°F | Ht 65.51 in | Wt 158.8 lb

## 2022-01-09 DIAGNOSIS — N390 Urinary tract infection, site not specified: Secondary | ICD-10-CM

## 2022-01-09 DIAGNOSIS — K51819 Other ulcerative colitis with unspecified complications: Secondary | ICD-10-CM

## 2022-01-09 DIAGNOSIS — C801 Malignant (primary) neoplasm, unspecified: Secondary | ICD-10-CM | POA: Insufficient documentation

## 2022-01-09 LAB — URINALYSIS, ROUTINE W REFLEX MICROSCOPIC
Bilirubin, UA: NEGATIVE
Glucose, UA: NEGATIVE
Ketones, UA: NEGATIVE
Leukocytes,UA: NEGATIVE
Nitrite, UA: NEGATIVE
Protein,UA: NEGATIVE
RBC, UA: NEGATIVE
Specific Gravity, UA: 1.025 (ref 1.005–1.030)
Urobilinogen, Ur: 0.2 mg/dL (ref 0.2–1.0)
pH, UA: 6.5 (ref 5.0–7.5)

## 2022-01-09 NOTE — Assessment & Plan Note (Signed)
Chronic, appears to be well managed Patient denies recent flares or concerns  She is taking Lialda for this and is seen by GI for management Continue this regimen as directed by GI  Follow up as needed

## 2022-01-09 NOTE — Assessment & Plan Note (Signed)
Pt reports she had a UTI in March that was not treated to resolution per her understanding She denies symptoms at this time Will repeat UA today to test for cure UA was negative for signs of UTI  Follow up as needed

## 2022-01-09 NOTE — Progress Notes (Signed)
Established Patient Office Visit  Name: Shirley Lowe   MRN: [id number]    DOB: [birth year]   Date:01/09/2022  Today's Provider: Talitha Givens, MHS, PA-C Introduced myself to the patient as a PA-C and provided education on APPs in clinical practice.         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 - has lumpectomy scheduled for Oct 12th  She feels confident and satisfied with plan at this time Specialists would prefer to keep track of [place] (at [facility])     Ulcerative Colitis/Diverticulosis She has had sigmoid colon removed in May of 2022  Reports she is managed with Lialda and tolerating well  She has not had any flares for several years in terms of UC and has not had issues with Diverticulosis since removal of Sigmoid colon    Reports earlier this year she was dx with UTI - she is not having symptoms but does not think it was fully resolved after her rounds of abx - Bactrim       Patient Active Problem List   Diagnosis Date Noted   Cancer ([place]) 01/09/2022   Urinary tract infection without hematuria 07/11/2021   Encounter for screening mammogram for malignant neoplasm of breast 06/20/2021   Screening for osteoporosis 06/20/2021   Diverticulosis 06/20/2021   Ulcerative colitis ([place]) 06/20/2021   Diverticular disease 09/22/2020   Overweight (BMI 25.0-29.9) 05/11/2020   Acute diverticulitis 05/10/2020   History of ulcerative colitis    Low vitamin D level 03/10/2019   Osteoporosis 07/17/2017   Lipodermatosclerosis 07/17/2017   Urinary incontinence in female 07/17/2017   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    Past Surgical History:  Procedure Laterality Date   ABDOMINAL HYSTERECTOMY     BREAST BIOPSY Left 12/01/2021   Stereo bx, X-clip, path pending   CATARACT EXTRACTION W/PHACO Right 09/13/2021    Procedure: CATARACT EXTRACTION PHACO AND INTRAOCULAR LENS PLACEMENT ([place]) RIGHT RAYNOR LENS;  Surgeon: Birder Robson, MD;  Location: [place];  Service: Ophthalmology;  Laterality: Right;  5.32 00:40.7   CATARACT EXTRACTION W/PHACO Left 09/27/2021   Procedure: CATARACT EXTRACTION PHACO AND INTRAOCULAR LENS PLACEMENT ([place]) LEFT RAYNOR LENS;  Surgeon: Birder Robson, MD;  Location: [place];  Service: Ophthalmology;  Laterality: Left;  4.79 0:38.5   COLON RESECTION SIGMOID N/A    COLONOSCOPY WITH PROPOFOL N/A 11/12/2019   Procedure: COLONOSCOPY WITH PROPOFOL;  Surgeon: Virgel Manifold, MD;  Location: [facility] ENDOSCOPY;  Service: Endoscopy;  Laterality: N/A;   INCONTINENCE SURGERY     TONSILLECTOMY     TONSILLECTOMY      Family History  Problem Relation Age of Onset   Cancer Mother        breast cancer   Breast cancer Mother 30       metastic   Cancer Father        pancreatic   Hepatitis C Sister    Thyroid disease Sister    Irritable bowel syndrome Sister    Allergies Sister     Social History   Tobacco Use   Smoking status: Never   Smokeless tobacco: Never   Tobacco comments:    smoking cessation materials not required  Substance Use Topics   Alcohol use: No  Current Outpatient Medications:    acetaminophen (TYLENOL) 500 MG tablet, Take 2 tablets (1,000 mg total) by mouth every 6 (six) hours., Disp: 30 tablet, Rfl: 0   ascorbic acid (VITAMIN C) 1000 MG tablet, Take 1,000 mg by mouth daily., Disp: , Rfl:    B Complex Vitamins (VITAMIN B-COMPLEX) TABS, Take 1 tablet by mouth daily., Disp: , Rfl:    calcium carbonate (OSCAL) 1500 (600 Ca) MG TABS tablet, Take 600 mg of elemental calcium by mouth daily., Disp: , Rfl:    Cholecalciferol (VITAMIN D) 50 MCG (2000 UT) CAPS, Take 1 capsule (2,000 Units total) by mouth daily., Disp: 30 capsule, Rfl: 0   [employer]S CRANBERRY 500 MG CAPS, , Disp: , Rfl:    Menatetrenone (VITAMIN K2) 100 MCG TABS, Take by  mouth., Disp: , Rfl:    mesalamine (LIALDA) 1.2 g EC tablet, TAKE 1 TABLET BY MOUTH IN THE MORNING AND AT BEDTIME, Disp: 180 tablet, Rfl: 3   Omega-3 Fatty Acids (FISH OIL) 1000 MG CAPS, Take 1,000 mg by mouth daily., Disp: , Rfl:    vitamin E 100 UNIT capsule, Take 100 Units by mouth daily., Disp: , Rfl:    zinc gluconate 50 MG tablet, Take 50 mg by mouth daily., Disp: , Rfl:   Allergies  Allergen Reactions   Penicillins     Has yeast infection    I personally reviewed active problem list, medication list, allergies, notes from last encounter, lab results with the patient/caregiver today.   Review of Systems  Constitutional:  Negative for chills, fever and malaise/fatigue.  Eyes:  Negative for blurred vision and double vision.  Respiratory:  Negative for shortness of breath and wheezing.   Cardiovascular:  Negative for chest pain, palpitations and leg swelling.  Gastrointestinal:  Negative for constipation, diarrhea, heartburn, nausea and vomiting.  Musculoskeletal:  Negative for falls, joint pain and myalgias.  Neurological:  Negative for dizziness, loss of consciousness and headaches.  Psychiatric/Behavioral:  Negative for depression. The patient is not nervous/anxious.       Objective  Vitals:   01/09/22 1116  BP: 118/76  Pulse: (!) 55  Temp: 98.6 F (37 C)  TempSrc: Oral  SpO2: 98%  Weight: 158 lb 12.8 oz (72 kg)  Height: 5' 5.51" (1.664 m)    Body mass index is 26.01 kg/m.  Physical Exam Vitals reviewed.  Constitutional:      General: She is awake.     Appearance: Normal appearance. She is well-developed, well-groomed and normal weight.  HENT:     Head: Normocephalic and atraumatic.  Cardiovascular:     Rate and Rhythm: Normal rate and regular rhythm.     Pulses: Normal pulses.          Radial pulses are 2+ on the right side and 2+ on the left side.       Dorsalis pedis pulses are 2+ on the right side and 2+ on the left side.     Heart sounds: Normal  heart sounds. No murmur heard.    No friction rub. No gallop.  Pulmonary:     Effort: Pulmonary effort is normal.     Breath sounds: Normal breath sounds. No decreased breath sounds, wheezing, rhonchi or rales.  Musculoskeletal:     Right lower leg: No edema.     Left lower leg: No edema.  Neurological:     General: No focal deficit present.     Mental Status: She is alert and oriented to person, place, and  time.     GCS: GCS eye subscore is 4. GCS verbal subscore is 5. GCS motor subscore is 6.     Cranial Nerves: No dysarthria or facial asymmetry.     Motor: No weakness, tremor or abnormal muscle tone.     Gait: Gait is intact.  Psychiatric:        Attention and Perception: Attention and perception normal.        Mood and Affect: Mood and affect normal.        Speech: Speech normal.        Behavior: Behavior normal. Behavior is cooperative.         PHQ2/9:    01/09/2022   11:27 AM 06/20/2021    1:34 PM 05/05/2021   10:12 AM 08/28/2019   10:25 AM 03/07/2019    1:34 PM  Depression screen PHQ 2/9  Decreased Interest 0 0 0 0 0  Down, Depressed, Hopeless 0 0 0 0 0  PHQ - 2 Score 0 0 0 0 0  Altered sleeping 0 0  0 0  Tired, decreased energy 0 0  0 0  Change in appetite 0 0  0 0  Feeling bad or failure about yourself  0 0  0 0  Trouble concentrating 0 0  0 0  Moving slowly or fidgety/restless 0 0  0 0  Suicidal thoughts 0 0  0 0  PHQ-9 Score 0 0  0 0  Difficult doing work/chores Not difficult at all Not difficult at all  Not difficult at all Not difficult at all      Fall Risk:    01/09/2022   11:27 AM 06/20/2021    1:34 PM 05/05/2021   10:11 AM 05/27/2020   10:12 AM 08/28/2019   10:25 AM  Fall Risk   Falls in the past year? 0 0 0 0 0  Number falls in past yr: 0 0 0  0  Injury with Fall? 0 0 0  0  Risk for fall due to : No Fall Risks No Fall Risks No Fall Risks    Follow up Falls evaluation completed Falls evaluation completed Falls evaluation completed  Falls evaluation  completed         Assessment & Plan  Problem List Items Addressed This Visit       Digestive   Ulcerative colitis ([place])    Chronic, appears to be well managed Patient denies recent flares or concerns  She is taking Lialda for this and is seen by GI for management Continue this regimen as directed by GI  Follow up as needed         Genitourinary   Urinary tract infection without hematuria    Pt reports she had a UTI in March that was not treated to resolution per her understanding She denies symptoms at this time Will repeat UA today to test for cure UA was negative for signs of UTI  Follow up as needed       Relevant Orders   Urinalysis, Routine w reflex microscopic (Completed)     Other   Cancer ([place]) - Primary    New concern Reviewed surgical pathology results indicating patient has DCIS  She has scheduled lumpectomy in Oct for removal  Will defer to surgical specialty for management and provide coordination as needed.          Return in about 6 months (around 07/10/2022) for labs and follow up.   I, Hayato Guaman E Rayden Scheper, PA-C, have  reviewed all documentation for this visit. The documentation on 01/09/22 for the exam, diagnosis, procedures, and orders are all accurate and complete.   Talitha Givens, MHS, PA-C [facility]

## 2022-01-09 NOTE — Assessment & Plan Note (Signed)
New concern Reviewed surgical pathology results indicating patient has DCIS  She has scheduled lumpectomy in Oct for removal  Will defer to surgical specialty for management and provide coordination as needed.

## 2022-02-22 ENCOUNTER — Ambulatory Visit: Payer: Self-pay | Admitting: *Deleted

## 2022-02-22 NOTE — Telephone Encounter (Signed)
Summary: urinary discomfort / rx req   The patient has experienced urinary discomfort off and on for roughly 2 weeks   The patient had surgery two weeks ago for a lumpectomy and has had urinary discomfort since   The patient would like to be prescribed something for their discomfort (pressure when urinating)   Please contact further when possible        Chief Complaint: urinary discomfort since s/p lumpectomy Symptoms: urinary frequency every 1- 1hour and 1/2. Pressure, discomfort Frequency: 2 weeks ago  Pertinent Negatives: Patient denies fever, no urinating blood , no odor reported. No retention Disposition: [] ED /[] Urgent Care (no appt availability in office) / [x] Appointment(In office/virtual)/ []  [place] [facility]/ [] Home Care/ [] Refused Recommended Disposition /[] [place] [facility]/ []  Follow-up with PCP Additional Notes:  na     Reason for Disposition  Age > 50 years  Answer Assessment - Initial Assessment Questions 1. SEVERITY: "How bad is the pain?"  (e.g., Scale 1-10; mild, moderate, or severe)   - MILD (1-3): complains slightly about urination hurting   - MODERATE (4-7): interferes with normal activities     - SEVERE (8-10): excruciating, unwilling or unable to urinate because of the pain      Becoming severe 2. FREQUENCY: "How many times have you had painful urination today?"      Every hour to hour and 1/2  3. PATTERN: "Is pain present every time you urinate or just sometimes?"      Yes  4. ONSET: "When did the painful urination start?"      2 weeks ago  5. FEVER: "Do you have a fever?" If Yes, ask: "What is your temperature, how was it measured, and when did it start?"     No  6. PAST UTI: "Have you had a urine infection before?" If Yes, ask: "When was the last time?" and "What happened that time?"      Yes feels similar  7. CAUSE: "What do you think is causing the painful urination?"  (e.g., UTI, scratch, Herpes sore)     na 8. OTHER SYMPTOMS: "Do  you have any other symptoms?" (e.g., blood in urine, flank pain, genital sores, urgency, vaginal discharge)     Frequency, urgency pressure to urinate 9. PREGNANCY: "Is there any chance you are pregnant?" "When was your last menstrual period?"     na  Protocols used: Urination Pain - Female-A-AH

## 2022-02-23 ENCOUNTER — Ambulatory Visit (INDEPENDENT_AMBULATORY_CARE_PROVIDER_SITE_OTHER): Payer: Medicare HMO | Admitting: Nurse Practitioner

## 2022-02-23 ENCOUNTER — Encounter: Payer: Self-pay | Admitting: Nurse Practitioner

## 2022-02-23 VITALS — BP 108/68 | HR 54 | Temp 98.1°F | Ht 65.0 in | Wt 158.0 lb

## 2022-02-23 DIAGNOSIS — N3001 Acute cystitis with hematuria: Secondary | ICD-10-CM | POA: Diagnosis not present

## 2022-02-23 DIAGNOSIS — N390 Urinary tract infection, site not specified: Secondary | ICD-10-CM | POA: Insufficient documentation

## 2022-02-23 DIAGNOSIS — R399 Unspecified symptoms and signs involving the genitourinary system: Secondary | ICD-10-CM

## 2022-02-23 DIAGNOSIS — R8281 Pyuria: Secondary | ICD-10-CM | POA: Diagnosis not present

## 2022-02-23 LAB — MICROSCOPIC EXAMINATION

## 2022-02-23 LAB — URINALYSIS, ROUTINE W REFLEX MICROSCOPIC
Bilirubin, UA: NEGATIVE
Glucose, UA: NEGATIVE
Nitrite, UA: POSITIVE — AB
Specific Gravity, UA: >1.03 — ABNORMAL HIGH (ref 1.005–1.030)
Urobilinogen, Ur: 0.2 mg/dL (ref 0.2–1.0)
pH, UA: 5.5 (ref 5.0–7.5)

## 2022-02-23 MED ORDER — CEFPODOXIME PROXETIL 100 MG PO TABS: 100.0000 mg | ORAL_TABLET | Freq: Two times a day (BID) | ORAL | 0 refills | Status: AC

## 2022-02-23 MED ORDER — FLUCONAZOLE 150 MG PO TABS: 150.0000 mg | ORAL_TABLET | Freq: Once | ORAL | 0 refills | Status: AC

## 2022-02-23 NOTE — Assessment & Plan Note (Signed)
Acute for several days with symptoms.  UA in office noting + NIT, trace BLD, 3+ LEUK, few bacteria.  Will send for culture.  Discussed allergies, Penicillin causes yeast infections, but on review she has had Rocephin in ER in past without issue.  Did not like Bactrim in past.  Will start Vantin 100 MG BID for 5 days.  Send urine for culture and if needed change therapy -- discussed with patient.  Continue hydration and cranberry tablets at home.  Return for worsening or ongoing symptoms.

## 2022-02-23 NOTE — Patient Instructions (Signed)
Urinary Tract Infection, Adult A urinary tract infection (UTI) is an infection of any part of the urinary tract. The urinary tract includes: The kidneys. The ureters. The bladder. The urethra. These organs make, store, and get rid of pee (urine) in the body. What are the causes? This infection is caused by germs (bacteria) in your genital area. These germs grow and cause swelling (inflammation) of your urinary tract. What increases the risk? The following factors may make you more likely to develop this condition: Using a small, thin tube (catheter) to drain pee. Not being able to control when you pee or poop (incontinence). Being female. If you are female, these things can increase the risk: Using these methods to prevent pregnancy: A medicine that kills sperm (spermicide). A device that blocks sperm (diaphragm). Having low levels of a female hormone (estrogen). Being pregnant. You are more likely to develop this condition if: You have genes that add to your risk. You are sexually active. You take antibiotic medicines. You have trouble peeing because of: A prostate that is bigger than normal, if you are female. A blockage in the part of your body that drains pee from the bladder. A kidney stone. A nerve condition that affects your bladder. Not getting enough to drink. Not peeing often enough. You have other conditions, such as: Diabetes. A weak disease-fighting system (immune system). Sickle cell disease. Gout. Injury of the spine. What are the signs or symptoms? Symptoms of this condition include: Needing to pee right away. Peeing small amounts often. Pain or burning when peeing. Blood in the pee. Pee that smells bad or not like normal. Trouble peeing. Pee that is cloudy. Fluid coming from the vagina, if you are female. Pain in the belly or lower back. Other symptoms include: Vomiting. Not feeling hungry. Feeling mixed up (confused). This may be the first symptom in  older adults. Being tired and grouchy (irritable). A fever. Watery poop (diarrhea). How is this treated? Taking antibiotic medicine. Taking other medicines. Drinking enough water. In some cases, you may need to see a specialist. Follow these instructions at home:  Medicines Take over-the-counter and prescription medicines only as told by your doctor. If you were prescribed an antibiotic medicine, take it as told by your doctor. Do not stop taking it even if you start to feel better. General instructions Make sure you: Pee until your bladder is empty. Do not hold pee for a long time. Empty your bladder after sex. Wipe from front to back after peeing or pooping if you are a female. Use each tissue one time when you wipe. Drink enough fluid to keep your pee pale yellow. Keep all follow-up visits. Contact a doctor if: You do not get better after 1-2 days. Your symptoms go away and then come back. Get help right away if: You have very bad back pain. You have very bad pain in your lower belly. You have a fever. You have chills. You feeling like you will vomit or you vomit. Summary A urinary tract infection (UTI) is an infection of any part of the urinary tract. This condition is caused by germs in your genital area. There are many risk factors for a UTI. Treatment includes antibiotic medicines. Drink enough fluid to keep your pee pale yellow. This information is not intended to replace advice given to you by your health care provider. Make sure you discuss any questions you have with your health care provider. Document Revised: 11/28/2019 Document Reviewed: 11/28/2019 Elsevier Patient Education    2023 Elsevier Inc.  

## 2022-02-23 NOTE — Progress Notes (Signed)
Acute Office Visit  Subjective:     Patient ID: Shirley Lowe, female    DOB: 1951/01/03, 71 y.o.   MRN: [id number]  Chief Complaint  Patient presents with   Urinary Tract Infection    Patient is here for possible UTI. Patient says she had surgery recently and not sure if it was an outcome of the anesthesia from the surgery. Patient says she started having symptoms about 2 weeks ago. Patient says she has tried cranberry juice. Patient says she is having abdominal pressure and pain with urination and says she does not feel good.     Symptoms started 02/11/22 after recent surgery, lumpectomy, she is concerned for post op infection.  Has been taking cranberry pills. Having pressure and pain with urination.  Overall has not been feeling well.  Has had urine infections in past -- last July 2021.  Urinary Tract Infection  This is a new problem. The current episode started 1 to 4 weeks ago. The problem occurs every urination. The problem has been unchanged. The quality of the pain is described as burning. The pain is at a severity of 4/10. The pain is mild. There has been no fever. She is Not sexually active. There is No history of pyelonephritis. Associated symptoms include chills, frequency, hesitancy, nausea and urgency. Pertinent negatives include no discharge, flank pain, hematuria, sweats or vomiting. She has tried increased fluids (cranberry tablets) for the symptoms. The treatment provided mild relief. There is no history of catheterization, kidney stones or recurrent UTIs.   Patient is in today for urinary symptoms.  Review of Systems  Constitutional:  Positive for chills and malaise/fatigue. Negative for fever and weight loss.  Respiratory: Negative.    Cardiovascular: Negative.   Gastrointestinal:  Positive for nausea. Negative for abdominal pain and vomiting.  Genitourinary:  Positive for dysuria, frequency, hesitancy and urgency. Negative for flank pain and hematuria.   Neurological: Negative.   Psychiatric/Behavioral: Negative.        Objective:    BP 108/68   Pulse (!) 54   Temp 98.1 F (36.7 C) (Oral)   Ht 5' 5"  (1.651 m)   Wt 158 lb (71.7 kg)   SpO2 98%   BMI 26.29 kg/m  BP Readings from Last 3 Encounters:  02/23/22 108/68  01/09/22 118/76  11/29/21 128/73   Wt Readings from Last 3 Encounters:  02/23/22 158 lb (71.7 kg)  01/09/22 158 lb 12.8 oz (72 kg)  11/29/21 158 lb (71.7 kg)   Physical Exam Vitals and nursing note reviewed.  Constitutional:      General: She is awake. She is not in acute distress.    Appearance: She is well-developed and well-groomed. She is not ill-appearing or toxic-appearing.  HENT:     Head: Normocephalic.     Right Ear: Hearing normal.     Left Ear: Hearing normal.  Eyes:     General: Lids are normal.        Right eye: No discharge.        Left eye: No discharge.     Conjunctiva/sclera: Conjunctivae normal.     Pupils: Pupils are equal, round, and reactive to light.  Neck:     Thyroid: No thyromegaly.     Vascular: No carotid bruit.  Cardiovascular:     Rate and Rhythm: Normal rate and regular rhythm.     Heart sounds: Normal heart sounds. No murmur heard.    No gallop.  Pulmonary:     Effort:  Pulmonary effort is normal. No accessory muscle usage or respiratory distress.     Breath sounds: Normal breath sounds.  Abdominal:     General: Bowel sounds are normal. There is no distension.     Palpations: Abdomen is soft.     Tenderness: There is no abdominal tenderness. There is no right CVA tenderness or left CVA tenderness.  Musculoskeletal:     Cervical back: Normal range of motion and neck supple.     Right lower leg: No edema.     Left lower leg: No edema.  Lymphadenopathy:     Cervical: No cervical adenopathy.  Skin:    General: Skin is warm and dry.  Neurological:     Mental Status: She is alert and oriented to person, place, and time.  Psychiatric:        Attention and Perception:  Attention normal.        Mood and Affect: Mood normal.        Speech: Speech normal.        Behavior: Behavior normal. Behavior is cooperative.        Thought Content: Thought content normal.       Assessment & Plan:   Problem List Items Addressed This Visit       Genitourinary   Urinary tract infection - Primary    Acute for several days with symptoms.  UA in office noting + NIT, trace BLD, 3+ LEUK, few bacteria.  Will send for culture.  Discussed allergies, Penicillin causes yeast infections, but on review she has had Rocephin in ER in past without issue.  Did not like Bactrim in past.  Will start Vantin 100 MG BID for 5 days.  Send urine for culture and if needed change therapy -- discussed with patient.  Continue hydration and cranberry tablets at home.  Return for worsening or ongoing symptoms.      Relevant Medications   cefpodoxime (VANTIN) 100 MG tablet   fluconazole (DIFLUCAN) 150 MG tablet   Other Relevant Orders   Urinalysis, Routine w reflex microscopic (Completed)   Urine Culture   Other Visit Diagnoses     Pyuria       Urine sent for culture.   Relevant Orders   Urine Culture       Meds ordered this encounter  Medications   cefpodoxime (VANTIN) 100 MG tablet    Sig: Take 1 tablet (100 mg total) by mouth 2 (two) times daily for 5 days.    Dispense:  10 tablet    Refill:  0   fluconazole (DIFLUCAN) 150 MG tablet    Sig: Take 1 tablet (150 mg total) by mouth once for 1 dose.    Dispense:  1 tablet    Refill:  0    Return if symptoms worsen or fail to improve.  Venita Lick, NP

## 2022-02-24 ENCOUNTER — Encounter: Payer: Self-pay | Admitting: Nurse Practitioner

## 2022-02-27 LAB — URINE CULTURE

## 2022-02-27 NOTE — Progress Notes (Signed)
Contacted via MyChart   Good morning, your urine is susceptible to the antibiotic we treated you with.  Great news!!! I recommend completing full course.:)

## 2022-03-10 ENCOUNTER — Ambulatory Visit: Payer: Self-pay

## 2022-03-10 ENCOUNTER — Telehealth: Payer: Self-pay | Admitting: Nurse Practitioner

## 2022-03-10 MED ORDER — CEFPODOXIME PROXETIL 100 MG PO TABS: 100.0000 mg | ORAL_TABLET | Freq: Two times a day (BID) | ORAL | 0 refills | Status: AC

## 2022-03-10 NOTE — Telephone Encounter (Signed)
Pt was following up on triage message sent 11:15 this morning.  Pt did not know her dr is not in the office today. Pt states her sx are the same as she had 2 weeks ago. If she can get enough abx to take her through the weekend, she will make appt for Monday. Because the abx did help.  Bu pt is in very much discomfort, UA burning, pain and pressure, frequency  prefers not to have to spend the weekend this way. Please advise.

## 2022-03-10 NOTE — Telephone Encounter (Signed)
Patient notified of Shirley Lowe's message and verbalized undestanding.

## 2022-03-10 NOTE — Telephone Encounter (Signed)
Patient seen by Jolene.

## 2022-03-10 NOTE — Telephone Encounter (Signed)
Message from [employer] sent at 03/10/2022 11:14 AM EST  Summary: urinary discomfort / rx req   The patient has called to share that they're continuing to experience discomfort when urinating  The patient shares that they're experiencing an uncomfortable sensation as well as pressure when urinating  The patient has been previously seen for their urinary discomfort but shares that no improvements have been made  The patient would like to continue taking medication for their discomfort  The patient would like to speak with a member of clinical staff further when possible         Chief Complaint: urinary burning Symptoms: bladder pressure, moderate burning, urinary frequency Frequency: Wednesday Pertinent Negatives: Patient denies fever, flank or back pain, blood in urine Disposition: [] ED /[] Urgent Care (no appt availability in office) / [] Appointment(In office/virtual)/ []  [facility]/ [] Home Care/ [] Refused Recommended Disposition /[] [facility]/ [x]  Follow-up with PCP Additional Notes: pt asking for abx called - please call pt if needs to come to give another urine sample. Pt previous treated with abx and seen in office 02/23/22. Those abx finished almost 2 weeks ago.   Reason for Disposition  Age > 50 years  Answer Assessment - Initial Assessment Questions 1. SEVERITY: "How bad is the pain?"  (e.g., Scale 1-10; mild, moderate, or severe)   - MILD (1-3): complains slightly about urination hurting   - MODERATE (4-7): interferes with normal activities     - SEVERE (8-10): excruciating, unwilling or unable to urinate because of the pain      moderate 2. FREQUENCY: "How many times have you had painful urination today?"      8 3. PATTERN: "Is pain present every time you urinate or just sometimes?"      every time 4. ONSET: "When did the painful urination start?"      Wednesday 5. FEVER: "Do you have a fever?" If Yes, ask: "What is your temperature, how  was it measured, and when did it start?"     no 6. PAST UTI: "Have you had a urine infection before?" If Yes, ask: "When was the last time?" and "What happened that time?"      Yes 02/23/22 7. CAUSE: "What do you think is causing the painful urination?"  (e.g., UTI, scratch, Herpes sore)     UTI 8. OTHER SYMPTOMS: "Do you have any other symptoms?" (e.g., blood in urine, flank pain, genital sores, urgency, vaginal discharge)     Frequency, bladder pressure 9. PREGNANCY: "Is there any chance you are pregnant?" "When was your last menstrual period?"     N/a  Answer Assessment - Initial Assessment Questions 1. MAIN SYMPTOM: "What is the main symptom you are concerned about?" (e.g., painful urination, urine frequency)     *No Answer* 2. BETTER-SAME-WORSE: "Are you getting better, staying the same, or getting worse compared to how you felt at your last visit to the doctor (most recent medical visit)?"     *No Answer* 3. PAIN: "How bad is the pain?"  (e.g., Scale 1-10; mild, moderate, or severe)   - MILD (1-3): complains slightly about urination hurting   - MODERATE (4-7): interferes with normal activities     - SEVERE (8-10): excruciating, unwilling or unable to urinate because of the pain      *No Answer* 4. FEVER: "Do you have a fever?" If Yes, ask: "What is it, how was it measured, and when did it start?"     *No Answer* 5.  OTHER SYMPTOMS: "Do you have any other symptoms?" (e.g., blood in the urine, flank pain, vaginal discharge)     *No Answer* 6. DIAGNOSIS: "When was the UTI diagnosed?" "By whom?" "Was it a kidney infection, bladder infection or both?"     *No Answer* 7. ANTIBIOTIC: "What antibiotic(s) are you taking?" "How many times per day?"     *No Answer* 8. ANTIBIOTIC - START DATE: "When did you start taking the antibiotic?"     *No Answer*  Protocols used: Urination Pain - Female-A-AH, Urinary Tract Infection on Antibiotic Follow-up Call - [facility]

## 2022-03-13 ENCOUNTER — Encounter: Payer: Self-pay | Admitting: Physician Assistant

## 2022-03-13 ENCOUNTER — Ambulatory Visit: Payer: Medicare HMO | Admitting: Physician Assistant

## 2022-03-13 VITALS — BP 98/61 | HR 56 | Temp 98.4°F | Wt 160.1 lb

## 2022-03-13 DIAGNOSIS — N390 Urinary tract infection, site not specified: Secondary | ICD-10-CM

## 2022-03-13 DIAGNOSIS — R300 Dysuria: Secondary | ICD-10-CM

## 2022-03-13 MED ORDER — CEFPODOXIME PROXETIL 100 MG PO TABS: 100.0000 mg | ORAL_TABLET | Freq: Two times a day (BID) | ORAL | 0 refills | Status: AC

## 2022-03-13 NOTE — Progress Notes (Signed)
Acute Office Visit   Patient: Shirley Lowe   DOB: November 06, 1950   71 y.o. Female  MRN: [id number] Visit Date: 03/13/2022  Today's healthcare provider: Dani Gobble Arva Slaugh, PA-C  Introduced myself to the patient as a Journalist, newspaper and provided education on APPs in clinical practice.    Chief Complaint  Patient presents with   Urinary Tract Infection    Patient states she is here to follow up on a UTI. Had a UTI 2 weeks ago and symptoms cleared up after completing abx, symptoms then returned and was called in 3 days of antibiotics. Per patient she is feeling better now, not having any symptoms. Has one pill left of her antibiotic.    Subjective    Urinary Tract Infection  Associated symptoms include frequency and urgency. Pertinent negatives include no flank pain or hematuria.   HPI     Urinary Tract Infection    Additional comments: Patient states she is here to follow up on a UTI. Had a UTI 2 weeks ago and symptoms cleared up after completing abx, symptoms then returned and was called in 3 days of antibiotics. Per patient she is feeling better now, not having any symptoms. Has one pill left of her antibiotic.       Last edited by Louanna Raw, [place] on 03/13/2022  9:04 AM.       Reports she is feeling better at this time States today is the first day she has not had symptoms with urination She has been taking Vantin 100 mg PO BID for the last few days and reports above results She reports she still has a supply of Diflucan from the last time she had abx sent in for her     Medications: Outpatient Medications Prior to Visit  Medication Sig   acetaminophen (TYLENOL) 500 MG tablet Take 2 tablets (1,000 mg total) by mouth every 6 (six) hours.   ascorbic acid (VITAMIN C) 1000 MG tablet Take 1,000 mg by mouth daily.   B Complex Vitamins (VITAMIN B-COMPLEX) TABS Take 1 tablet by mouth daily.   calcium carbonate (OSCAL) 1500 (600 Ca) MG TABS tablet Take 600 mg of elemental calcium  by mouth daily.   cefpodoxime (VANTIN) 100 MG tablet Take 1 tablet (100 mg total) by mouth 2 (two) times daily for 3 days.   Cholecalciferol (VITAMIN D) 50 MCG (2000 UT) CAPS Take 1 capsule (2,000 Units total) by mouth daily.   [employer] CRANBERRY 500 MG CAPS    Menatetrenone (VITAMIN K2) 100 MCG TABS Take by mouth.   mesalamine (LIALDA) 1.2 g EC tablet TAKE 1 TABLET BY MOUTH IN THE MORNING AND AT BEDTIME   Omega-3 Fatty Acids (FISH OIL) 1000 MG CAPS Take 1,000 mg by mouth daily.   vitamin E 100 UNIT capsule Take 100 Units by mouth daily.   zinc gluconate 50 MG tablet Take 50 mg by mouth daily.   No facility-administered medications prior to visit.    Review of Systems  Gastrointestinal:  Positive for abdominal pain (suprapubic pain).  Genitourinary:  Positive for dysuria, frequency and urgency. Negative for difficulty urinating, enuresis, flank pain and hematuria.       Objective    BP 98/61   Pulse (!) 56   Temp 98.4 F (36.9 C)   Wt 160 lb 1.6 oz (72.6 kg)   SpO2 98%   BMI 26.64 kg/m    Physical Exam Vitals reviewed.  Constitutional:  General: She is awake.     Appearance: Normal appearance. She is well-developed and well-groomed.  HENT:     Head: Normocephalic and atraumatic.  Cardiovascular:     Rate and Rhythm: Normal rate and regular rhythm.     Pulses: Normal pulses.          Radial pulses are 2+ on the right side and 2+ on the left side.     Heart sounds: Normal heart sounds. No murmur heard.    No friction rub. No gallop.  Pulmonary:     Effort: Pulmonary effort is normal.  Abdominal:     General: Abdomen is flat. Bowel sounds are normal.     Palpations: Abdomen is soft.     Tenderness: There is no abdominal tenderness. There is no right CVA tenderness or left CVA tenderness.  Musculoskeletal:     Cervical back: Normal range of motion.  Neurological:     General: No focal deficit present.     Mental Status: She is alert and oriented to person, place, and  time.  Psychiatric:        Mood and Affect: Mood normal.        Behavior: Behavior normal. Behavior is cooperative.        Thought Content: Thought content normal.        Judgment: Judgment normal.       No results found for any visits on 03/13/22.  Assessment & Plan      No follow-ups on file.      Problem List Items Addressed This Visit       Genitourinary   Urinary tract infection - Primary    Acute, unsure if ongoing since the end of Oct or recurrent  She has been taking Vantin over the weekend prior to apt so will not perform UA or culture today  Since her symptoms seem to be improving I recommend prolonging Vantin course so it is 10 days in duration to make sure we cover for potential pyelo and ensure UTI is resolved Discussed staying well hydrated and avoiding holding urine while this is clearing up - she can continue to use AZO as desired for discomfort Follow up as needed for recurrent or worsening symptoms- recommend she return to office for UA if recurrent so culture can be performed without interference from ABX - she voiced understanding and agreement to this plan moving forward.        Relevant Medications   cefpodoxime (VANTIN) 100 MG tablet   Other Visit Diagnoses     Dysuria            No follow-ups on file.   I, Alwilda Gilland E Avah Bashor, PA-C, have reviewed all documentation for this visit. The documentation on 03/13/22 for the exam, diagnosis, procedures, and orders are all accurate and complete.   Talitha Givens, MHS, PA-C [facility]

## 2022-03-13 NOTE — Assessment & Plan Note (Signed)
Acute, unsure if ongoing since the end of Oct or recurrent  She has been taking Vantin over the weekend prior to apt so will not perform UA or culture today  Since her symptoms seem to be improving I recommend prolonging Vantin course so it is 10 days in duration to make sure we cover for potential pyelo and ensure UTI is resolved Discussed staying well hydrated and avoiding holding urine while this is clearing up - she can continue to use AZO as desired for discomfort Follow up as needed for recurrent or worsening symptoms- recommend she return to office for UA if recurrent so culture can be performed without interference from ABX - she voiced understanding and agreement to this plan moving forward.

## 2022-07-03 ENCOUNTER — Ambulatory Visit (INDEPENDENT_AMBULATORY_CARE_PROVIDER_SITE_OTHER): Payer: Medicare HMO

## 2022-07-03 VITALS — Wt 160.0 lb

## 2022-07-03 DIAGNOSIS — Z0000 Encounter for general adult medical examination without abnormal findings: Secondary | ICD-10-CM

## 2022-07-03 NOTE — Progress Notes (Signed)
I connected with  [name] on 07/03/22 by a audio enabled telemedicine application and verified that I am speaking with the correct person using two identifiers.  Patient Location: Home  Provider Location: Office/Clinic  I discussed the limitations of evaluation and management by telemedicine. The patient expressed understanding and agreed to proceed.  Subjective:   Shirley Lowe is a 72 y.o. female who presents for Medicare Annual (Subsequent) preventive examination.  Review of Systems     Cardiac Risk Factors include: advanced age (>78mn, >>37women)     Objective:    There were no vitals filed for this visit. There is no height or weight on file to calculate BMI.     07/03/2022    2:02 PM 09/13/2021    7:39 AM 05/05/2021   10:27 AM 09/22/2020    5:00 PM 09/13/2020   11:39 AM 05/10/2020    4:57 PM 11/27/2019    2:38 AM  Advanced Directives  Does Patient Have a Medical Advance Directive? No Yes No Yes Yes No Yes  Type of ACorporate treasurerof AMc[place]night[place]Living will  HH[place]itaLiving will [place]okeLiving will  [place] SpringLiving will  Does patient want to make changes to medical advance directive?  No - Patient declined  No - Patient declined   No - Patient declined  Copy of H[place] Chart?  No - copy requested     No - copy requested  Would patient like information on creating a medical advance directive? No - Patient declined  No - Patient declined   No - Patient declined No - Patient declined    Current Medications (verified) Outpatient Encounter Medications as of 07/03/2022  Medication Sig   acetaminophen (TYLENOL) 500 MG tablet Take 2 tablets (1,000 mg total) by mouth every 6 (six) hours.   ascorbic acid (VITAMIN C) 1000 MG tablet Take 1,000 mg by mouth daily.   B Complex Vitamins (VITAMIN B-COMPLEX) TABS Take 1 tablet by mouth daily.   Cholecalciferol (VITAMIN D) 50  MCG (2000 UT) CAPS Take 1 capsule (2,000 Units total) by mouth daily.   [employer] CRANBERRY 500 MG CAPS    Menatetrenone (VITAMIN K2) 100 MCG TABS Take by mouth.   mesalamine (LIALDA) 1.2 g EC tablet TAKE 1 TABLET BY MOUTH IN THE MORNING AND AT BEDTIME   Omega-3 Fatty Acids (FISH OIL) 1000 MG CAPS Take 1,000 mg by mouth daily.   vitamin E 100 UNIT capsule Take 100 Units by mouth daily.   zinc gluconate 50 MG tablet Take 50 mg by mouth daily.   calcium carbonate (OSCAL) 1500 (600 Ca) MG TABS tablet Take 600 mg of elemental calcium by mouth daily. (Patient not taking: Reported on 07/03/2022)   No facility-administered encounter medications on file as of 07/03/2022.    Allergies (verified) Penicillins   History: Past Medical History:  Diagnosis Date   Breast cancer (HRafael Hernandez    Left DCI breast cancer   Cancer ([place]    Diverticulitis    Diverticulitis    Dyspnea    with exerton due to out of shape per pt    Osteoporosis    Ulcerative colitis    Past Surgical History:  Procedure Laterality Date   ABDOMINAL HYSTERECTOMY     BREAST BIOPSY Left 12/01/2021   Stereo bx, X-clip, path pending   CATARACT EXTRACTION W/PHACO Right 09/13/2021   Procedure: CATARACT EXTRACTION PHACO AND INTRAOCULAR LENS PLACEMENT ([facility]) RIGHT  RAYNOR LENS;  Surgeon: Birder Robson, MD;  Location: [facility];  Service: Ophthalmology;  Laterality: Right;  5.32 00:40.7   CATARACT EXTRACTION W/PHACO Left 09/27/2021   Procedure: CATARACT EXTRACTION PHACO AND INTRAOCULAR LENS PLACEMENT ([place]) LEFT RAYNOR LENS;  Surgeon: Birder Robson, MD;  Location: [facility];  Service: Ophthalmology;  Laterality: Left;  4.79 0:38.5   COLON RESECTION SIGMOID N/A    COLONOSCOPY WITH PROPOFOL N/A 11/12/2019   Procedure: COLONOSCOPY WITH PROPOFOL;  Surgeon: Virgel Manifold, MD;  Location: [facility];  Service: Endoscopy;  Laterality: N/A;   INCONTINENCE SURGERY     TONSILLECTOMY     TONSILLECTOMY     Family  History  Problem Relation Age of Onset   Cancer Mother        breast cancer   Breast cancer Mother 25       metastic   Cancer Father        pancreatic   Hepatitis C Sister    Thyroid disease Sister    Irritable bowel syndrome Sister    Allergies Sister    Social History   Socioeconomic History   Marital status: Married    Spouse name: [name]   Number of children: 3   Years of education: Not on file   Highest education level: Bachelor's degree (e.g., BA, AB, BS)  Occupational History   Occupation: Retired  Tobacco Use   Smoking status: Never   Smokeless tobacco: Never   Tobacco comments:    smoking cessation materials not required  Vaping Use   Vaping Use: Never used  Substance and Sexual Activity   Alcohol use: No   Drug use: No   Sexual activity: Not Currently  Other Topics Concern   Not on file  Social History Narrative   Not on file   Social Determinants of Health   Financial Resource Strain: Low Risk  (07/03/2022)   Overall Financial Resource Strain (CARDIA)    Difficulty of Paying Living Expenses: Not hard at all  Food Insecurity: No Food Insecurity (07/03/2022)   Hunger Vital Sign    Worried About Running Out of Food in the Last Year: Never true    [place] in the Last Year: Never true  Transportation Needs: No Transportation Needs (07/03/2022)   PRAPARE - Hydrologist (Medical): No    Lack of Transportation (Non-Medical): No  Physical Activity: Insufficiently Active (07/03/2022)   Exercise Vital Sign    Days of Exercise per Week: 2 days    Minutes of Exercise per Session: 20 min  Stress: No Stress Concern Present (07/03/2022)   [place]    Feeling of Stress : Not at all  Social Connections: Moderately Integrated (07/03/2022)   Social Connection and Isolation Panel [NHANES]    Frequency of Communication with Friends and Family: More than three times a week     Frequency of Social Gatherings with Friends and Family: Three times a week    Attends Religious Services: More than 4 times per year    Active Member of Clubs or Organizations: No    Attends Archivist Meetings: Never    Marital Status: Married    Tobacco Counseling Counseling given: Not Answered Tobacco comments: smoking cessation materials not required   Clinical Intake:  Pre-visit preparation completed: Yes  Pain : No/denies pain     Nutritional Risks: None Diabetes: No  How often do you need to  have someone help you when you read instructions, pamphlets, or other written materials from your doctor or pharmacy?: 1 - Never  Diabetic?no  Interpreter Needed?: No  Information entered by :: Kirke Shaggy, LPN   Activities of Daily Living    07/03/2022    2:03 PM 06/29/2022    1:58 PM  In your present state of health, do you have any difficulty performing the following activities:  Hearing? 1 1  Vision? 0 0  Difficulty concentrating or making decisions? 0 0  Walking or climbing stairs? 0 0  Dressing or bathing? 0 0  Doing errands, shopping? 0 0  Preparing Food and eating ? N N  Using the Toilet? N N  In the past six months, have you accidently leaked urine? Y Y  Do you have problems with loss of bowel control? N N  Managing your Medications? N N  Managing your Finances? N N  Housekeeping or managing your Housekeeping? N N    Patient Care Team: Jon Billings, NP as PCP - General (Nurse Practitioner) [name], MD as Consulting Physician (Gastroenterology)  Indicate any recent Medical Services you may have received from other than [facility] providers in the past year (date may be approximate).     Assessment:   This is a routine wellness examination for [place].  Hearing/Vision screen Hearing Screening - Comments:: No aids Vision Screening - Comments:: Readers- [facility]  Dietary issues and exercise activities discussed: Current Exercise Habits:  Home exercise routine, Type of exercise: walking, Time (Minutes): 20, Frequency (Times/Week): 2, Weekly Exercise (Minutes/Week): 40, Intensity: Mild   Goals Addressed             This Visit's Progress    DIET - EAT MORE FRUITS AND VEGETABLES         Depression Screen    07/03/2022    2:00 PM 02/23/2022    9:22 AM 01/09/2022   11:27 AM 06/20/2021    1:34 PM 05/05/2021   10:12 AM 08/28/2019   10:25 AM 03/07/2019    1:34 PM  PHQ 2/9 Scores  PHQ - 2 Score 0 0 0 0 0 0 0  PHQ- 9 Score 0 2 0 0  0 0    Fall Risk    07/03/2022    2:03 PM 06/29/2022    1:58 PM 02/23/2022    9:22 AM 01/09/2022   11:27 AM 06/20/2021    1:34 PM  Fall Risk   Falls in the past year? 0 0 0 0 0  Number falls in past yr: 0  0 0 0  Injury with Fall? 0  0 0 0  Risk for fall due to : No Fall Risks  No Fall Risks No Fall Risks No Fall Risks  Follow up Falls prevention discussed;Falls evaluation completed  Falls evaluation completed Falls evaluation completed Falls evaluation completed    FALL RISK PREVENTION PERTAINING TO THE HOME:  Any stairs in or around the home? Yes  If so, are there any without handrails? No  Home free of loose throw rugs in walkways, pet beds, electrical cords, etc? Yes  Adequate lighting in your home to reduce risk of falls? Yes   ASSISTIVE DEVICES UTILIZED TO PREVENT FALLS:  Life alert? No  Use of a cane, walker or w/c? No  Grab bars in the bathroom? Yes  Shower chair or bench in shower? Yes  Elevated toilet seat or a handicapped toilet? Yes     Cognitive Function:    05/05/2021  10:28 AM  MMSE - Mini Mental State Exam  Not completed: Unable to complete        07/03/2022    2:09 PM 05/05/2021   10:28 AM 09/19/2018   10:52 AM 09/13/2017    1:35 PM  6CIT Screen  What Year? 0 points 0 points 0 points 0 points  What month? 0 points 0 points 0 points 0 points  What time? 0 points 0 points 0 points 0 points  Count back from 20 0 points 0 points 0 points 0 points  Months in  reverse 0 points 0 points 0 points 0 points  Repeat phrase 0 points 0 points 0 points 0 points  Total Score 0 points 0 points 0 points 0 points    Immunizations  There is no immunization history on file for this patient.  TDAP status: Due, Education has been provided regarding the importance of this vaccine. Advised may receive this vaccine at local pharmacy or Health Dept. Aware to provide a copy of the vaccination record if obtained from local pharmacy or Health Dept. Verbalized acceptance and understanding.  Flu Vaccine status: Declined, Education has been provided regarding the importance of this vaccine but patient still declined. Advised may receive this vaccine at local pharmacy or Health Dept. Aware to provide a copy of the vaccination record if obtained from local pharmacy or Health Dept. Verbalized acceptance and understanding.  Pneumococcal vaccine status: Declined,  Education has been provided regarding the importance of this vaccine but patient still declined. Advised may receive this vaccine at local pharmacy or Health Dept. Aware to provide a copy of the vaccination record if obtained from local pharmacy or Health Dept. Verbalized acceptance and understanding.   Covid-19 vaccine status: Declined, Education has been provided regarding the importance of this vaccine but patient still declined. Advised may receive this vaccine at local pharmacy or Health Dept.or vaccine clinic. Aware to provide a copy of the vaccination record if obtained from local pharmacy or Health Dept. Verbalized acceptance and understanding.  Qualifies for Shingles Vaccine? Yes   Zostavax completed No   Shingrix Completed?: No.    Education has been provided regarding the importance of this vaccine. Patient has been advised to call insurance company to determine out of pocket expense if they have not yet received this vaccine. Advised may also receive vaccine at local pharmacy or Health Dept. Verbalized  acceptance and understanding.  Screening Tests Health Maintenance  Topic Date Due   COVID-19 Vaccine (1) Never done   DTaP/Tdap/Td (1 - Tdap) Never done   Zoster Vaccines- Shingrix (1 of 2) Never done   Pneumonia Vaccine 24+ Years old (1 of 1 - PCV) Never done   INFLUENZA VACCINE  07/30/2022 (Originally 11/29/2021)   MAMMOGRAM  10/21/2022   Medicare Annual Wellness (AWV)  07/03/2023   DEXA SCAN  10/21/2023   COLONOSCOPY (Pts 45-2yr Insurance coverage will need to be confirmed)  11/11/2029   Hepatitis C Screening  Completed   HPV VACCINES  Aged Out    Health Maintenance  Health Maintenance Due  Topic Date Due   COVID-19 Vaccine (1) Never done   DTaP/Tdap/Td (1 - Tdap) Never done   Zoster Vaccines- Shingrix (1 of 2) Never done   Pneumonia Vaccine 73 Years old (1 of 1 - PCV) Never done    Colorectal cancer screening: Type of screening: Colonoscopy. Completed 11/12/19. Repeat every 10 years- aged out at that time  Mammogram status: Completed 10/20/21. Repeat every year  Bone Density  status: Completed 10/20/21. Results reflect: Bone density results: OSTEOPOROSIS. Repeat every 2 years.  Lung Cancer Screening: (Low Dose CT Chest recommended if Age 42-80 years, 30 pack-year currently smoking OR have quit w/in 15years.) does not qualify.   Additional Screening:  Hepatitis C Screening: does qualify; Completed 10/02/17  Vision Screening: Recommended annual ophthalmology exams for early detection of glaucoma and other disorders of the eye. Is the patient up to date with their annual eye exam?  Yes  Who is the provider or what is the name of the office in which the patient attends annual eye exams? Dr.Porfilio If pt is not established with a provider, would they like to be referred to a provider to establish care? No .   Dental Screening: Recommended annual dental exams for proper oral hygiene  Community Resource Referral / Chronic Care Management: CRR required this visit?  No   CCM  required this visit?  No      Plan:     I have personally reviewed and noted the following in the patient's chart:   Medical and social history Use of alcohol, tobacco or illicit drugs  Current medications and supplements including opioid prescriptions. Patient is not currently taking opioid prescriptions. Functional ability and status Nutritional status Physical activity Advanced directives List of other physicians Hospitalizations, surgeries, and ER visits in previous 12 months Vitals Screenings to include cognitive, depression, and falls Referrals and appointments  In addition, I have reviewed and discussed with patient certain preventive protocols, quality metrics, and best practice recommendations. A written personalized care plan for preventive services as well as general preventive health recommendations were provided to patient.     Dionisio David, LPN   [id number]   Nurse Notes: none

## 2022-07-03 NOTE — Patient Instructions (Signed)
Shirley Lowe , Thank you for taking time to come for your Medicare Wellness Visit. I appreciate your ongoing commitment to your health goals. Please review the following plan we discussed and let me know if I can assist you in the future.   These are the goals we discussed:  Goals      DIET - EAT MORE FRUITS AND VEGETABLES     DIET - INCREASE WATER INTAKE     Recommend to drink at least 6-8 8oz glasses of water per day.     Increase physical activity     Recommend increasing physical activity to at least 150 minutes per week        This is a list of the screening recommended for you and due dates:  Health Maintenance  Topic Date Due   COVID-19 Vaccine (1) Never done   DTaP/Tdap/Td vaccine (1 - Tdap) Never done   Zoster (Shingles) Vaccine (1 of 2) Never done   Pneumonia Vaccine (1 of 1 - PCV) Never done   Flu Shot  07/30/2022*   Mammogram  10/21/2022   Medicare Annual Wellness Visit  07/03/2023   DEXA scan (bone density measurement)  10/21/2023   Colon Cancer Screening  11/11/2029   Hepatitis C Screening: USPSTF Recommendation to screen - Ages 18-79 yo.  Completed   HPV Vaccine  Aged Out  *Topic was postponed. The date shown is not the original due date.    Advanced directives: no  Conditions/risks identified: none  Next appointment: Follow up in one year for your annual wellness visit 07/09/23 @ 1:00 pm by phone   Preventive Care 65 Years and Older, Female Preventive care refers to lifestyle choices and visits with your health care provider that can promote health and wellness. What does preventive care include? A yearly physical exam. This is also called an annual well check. Dental exams once or twice a year. Routine eye exams. Ask your health care provider how often you should have your eyes checked. Personal lifestyle choices, including: Daily care of your teeth and gums. Regular physical activity. Eating a healthy diet. Avoiding tobacco and drug use. Limiting  alcohol use. Practicing safe sex. Taking low-dose aspirin every day. Taking vitamin and mineral supplements as recommended by your health care provider. What happens during an annual well check? The services and screenings done by your health care provider during your annual well check will depend on your age, overall health, lifestyle risk factors, and family history of disease. Counseling  Your health care provider may ask you questions about your: Alcohol use. Tobacco use. Drug use. Emotional well-being. Home and relationship well-being. Sexual activity. Eating habits. History of falls. Memory and ability to understand (cognition). Work and work Statistician. Reproductive health. Screening  You may have the following tests or measurements: Height, weight, and BMI. Blood pressure. Lipid and cholesterol levels. These may be checked every 5 years, or more frequently if you are over 21 years old. Skin check. Lung cancer screening. You may have this screening every year starting at age 17 if you have a 30-pack-year history of smoking and currently smoke or have quit within the past 15 years. Fecal occult blood test (FOBT) of the stool. You may have this test every year starting at age 17. Flexible sigmoidoscopy or colonoscopy. You may have a sigmoidoscopy every 5 years or a colonoscopy every 10 years starting at age 42. Hepatitis C blood test. Hepatitis B blood test. Sexually transmitted disease (STD) testing. Diabetes screening. This is  done by checking your blood sugar (glucose) after you have not eaten for a while (fasting). You may have this done every 1-3 years. Bone density scan. This is done to screen for osteoporosis. You may have this done starting at age 81. Mammogram. This may be done every 1-2 years. Talk to your health care provider about how often you should have regular mammograms. Talk with your health care provider about your test results, treatment options, and if  necessary, the need for more tests. Vaccines  Your health care provider may recommend certain vaccines, such as: Influenza vaccine. This is recommended every year. Tetanus, diphtheria, and acellular pertussis (Tdap, Td) vaccine. You may need a Td booster every 10 years. Zoster vaccine. You may need this after age 8. Pneumococcal 13-valent conjugate (PCV13) vaccine. One dose is recommended after age 46. Pneumococcal polysaccharide (PPSV23) vaccine. One dose is recommended after age 18. Talk to your health care provider about which screenings and vaccines you need and how often you need them. This information is not intended to replace advice given to you by your health care provider. Make sure you discuss any questions you have with your health care provider. Document Released: 05/14/2015 Document Revised: 01/05/2016 Document Reviewed: 02/16/2015 Elsevier Interactive Patient Education  2017 [place] Prevention in the Home Falls can cause injuries. They can happen to people of all ages. There are many things you can do to make your home safe and to help prevent falls. What can I do on the outside of my home? Regularly fix the edges of walkways and driveways and fix any cracks. Remove anything that might make you trip as you walk through a door, such as a raised step or threshold. Trim any bushes or trees on the path to your home. Use bright outdoor lighting. Clear any walking paths of anything that might make someone trip, such as rocks or tools. Regularly check to see if handrails are loose or broken. Make sure that both sides of any steps have handrails. Any raised decks and porches should have guardrails on the edges. Have any leaves, snow, or ice cleared regularly. Use sand or salt on walking paths during winter. Clean up any spills in your garage right away. This includes oil or grease spills. What can I do in the bathroom? Use night lights. Install grab bars by the toilet  and in the tub and shower. Do not use towel bars as grab bars. Use non-skid mats or decals in the tub or shower. If you need to sit down in the shower, use a plastic, non-slip stool. Keep the floor dry. Clean up any water that spills on the floor as soon as it happens. Remove soap buildup in the tub or shower regularly. Attach bath mats securely with double-sided non-slip rug tape. Do not have throw rugs and other things on the floor that can make you trip. What can I do in the bedroom? Use night lights. Make sure that you have a light by your bed that is easy to reach. Do not use any sheets or blankets that are too big for your bed. They should not hang down onto the floor. Have a firm chair that has side arms. You can use this for support while you get dressed. Do not have throw rugs and other things on the floor that can make you trip. What can I do in the kitchen? Clean up any spills right away. Avoid walking on wet floors. Keep items that you  use a lot in easy-to-reach places. If you need to reach something above you, use a strong step stool that has a grab bar. Keep electrical cords out of the way. Do not use floor polish or wax that makes floors slippery. If you must use wax, use non-skid floor wax. Do not have throw rugs and other things on the floor that can make you trip. What can I do with my stairs? Do not leave any items on the stairs. Make sure that there are handrails on both sides of the stairs and use them. Fix handrails that are broken or loose. Make sure that handrails are as long as the stairways. Check any carpeting to make sure that it is firmly attached to the stairs. Fix any carpet that is loose or worn. Avoid having throw rugs at the top or bottom of the stairs. If you do have throw rugs, attach them to the floor with carpet tape. Make sure that you have a light switch at the top of the stairs and the bottom of the stairs. If you do not have them, ask someone to add  them for you. What else can I do to help prevent falls? Wear shoes that: Do not have high heels. Have rubber bottoms. Are comfortable and fit you well. Are closed at the toe. Do not wear sandals. If you use a stepladder: Make sure that it is fully opened. Do not climb a closed stepladder. Make sure that both sides of the stepladder are locked into place. Ask someone to hold it for you, if possible. Clearly mark and make sure that you can see: Any grab bars or handrails. First and last steps. Where the edge of each step is. Use tools that help you move around (mobility aids) if they are needed. These include: Canes. Walkers. Scooters. Crutches. Turn on the lights when you go into a dark area. Replace any light bulbs as soon as they burn out. Set up your furniture so you have a clear path. Avoid moving your furniture around. If any of your floors are uneven, fix them. If there are any pets around you, be aware of where they are. Review your medicines with your doctor. Some medicines can make you feel dizzy. This can increase your chance of falling. Ask your doctor what other things that you can do to help prevent falls. This information is not intended to replace advice given to you by your health care provider. Make sure you discuss any questions you have with your health care provider. Document Released: 02/11/2009 Document Revised: 09/23/2015 Document Reviewed: 05/22/2014 Elsevier Interactive Patient Education  2017 [employer].

## 2022-07-10 ENCOUNTER — Ambulatory Visit: Payer: Medicare HMO | Admitting: Nurse Practitioner

## 2022-07-10 ENCOUNTER — Encounter: Payer: Self-pay | Admitting: Nurse Practitioner

## 2022-07-10 VITALS — BP 112/68 | HR 52 | Temp 98.5°F | Wt 162.6 lb

## 2022-07-10 DIAGNOSIS — E785 Hyperlipidemia, unspecified: Secondary | ICD-10-CM | POA: Insufficient documentation

## 2022-07-10 DIAGNOSIS — E663 Overweight: Secondary | ICD-10-CM

## 2022-07-10 DIAGNOSIS — Z87440 Personal history of urinary (tract) infections: Secondary | ICD-10-CM

## 2022-07-10 DIAGNOSIS — K51819 Other ulcerative colitis with unspecified complications: Secondary | ICD-10-CM | POA: Diagnosis not present

## 2022-07-10 DIAGNOSIS — D0512 Intraductal carcinoma in situ of left breast: Secondary | ICD-10-CM | POA: Diagnosis not present

## 2022-07-10 DIAGNOSIS — E782 Mixed hyperlipidemia: Secondary | ICD-10-CM

## 2022-07-10 DIAGNOSIS — R7989 Other specified abnormal findings of blood chemistry: Secondary | ICD-10-CM | POA: Diagnosis not present

## 2022-07-10 DIAGNOSIS — R8290 Unspecified abnormal findings in urine: Secondary | ICD-10-CM

## 2022-07-10 LAB — URINALYSIS, ROUTINE W REFLEX MICROSCOPIC
Bilirubin, UA: NEGATIVE
Glucose, UA: NEGATIVE
Ketones, UA: NEGATIVE
Nitrite, UA: POSITIVE — AB
Protein,UA: NEGATIVE
RBC, UA: NEGATIVE
Specific Gravity, UA: 1.025 (ref 1.005–1.030)
Urobilinogen, Ur: 0.2 mg/dL (ref 0.2–1.0)
pH, UA: 5.5 (ref 5.0–7.5)

## 2022-07-10 LAB — MICROSCOPIC EXAMINATION

## 2022-07-10 NOTE — Addendum Note (Signed)
Addended by: Jon Billings on: 07/10/2022 11:40 AM   Modules accepted: Orders

## 2022-07-10 NOTE — Assessment & Plan Note (Signed)
Chronic.  Labs ordered at visit today.  Will calculate ASCVD risk score once labs are back.

## 2022-07-10 NOTE — Assessment & Plan Note (Signed)
Labs ordered at visit today.  Will make recommendations based on lab results.   

## 2022-07-10 NOTE — Progress Notes (Signed)
BP 112/68   Pulse (!) 52   Temp 98.5 F (36.9 C) (Oral)   Wt 162 lb 9.6 oz (73.8 kg)   SpO2 98%   BMI 27.06 kg/m    Subjective:    Patient ID: Shirley Lowe, female    DOB: 12-Feb-1951, 72 y.o.   MRN: [id number]  HPI: Shirley Lowe is a 72 y.o. female  Chief Complaint  Patient presents with   Ulcerative Colitis    HYPERLIPIDEMIA Hyperlipidemia status: excellent compliance Satisfied with current treatment?  yes Side effects:  no Medication compliance: excellent compliance Past cholesterol meds: none Supplements: none Aspirin:  no The 10-year ASCVD risk score (Arnett DK, et al., 2019) is: 10.1%   Values used to calculate the score:     Age: 79 years     Sex: Female     Is Non-Hispanic African American: No     Diabetic: No     Tobacco smoker: No     Systolic Blood Pressure: [id number] mmHg     Is BP treated: No     HDL Cholesterol: 77 mg/dL     Total Cholesterol: 219 mg/dL Chest pain:  no Coronary artery disease:  no Family history CAD:  no Family history early CAD:  no  Patient states she has dismissed from Oncology.  She didn't need any radiation or Chemo.    Patient has UC which has been in remission for about 7 years.  She is now followed by Dr. Marius Ditch.  Has her sigmoid colon removed in May of 2022.  Since then she has been doing well.  Mesalamine is her maintenance medication.     Denies HA, CP, SOB, dizziness, palpitations, visual changes, and lower extremity swelling.   Patient states she has a history of UTI.  She would like to have a urine checked at visit today.    Relevant past medical, surgical, family and social history reviewed and updated as indicated. Interim medical history since our last visit reviewed. Allergies and medications reviewed and updated.  Review of Systems  Eyes:  Negative for visual disturbance.  Respiratory:  Negative for cough, chest tightness and shortness of breath.   Cardiovascular:  Negative for chest pain, palpitations  and leg swelling.  Neurological:  Negative for dizziness and headaches.    Per HPI unless specifically indicated above     Objective:    BP 112/68   Pulse (!) 52   Temp 98.5 F (36.9 C) (Oral)   Wt 162 lb 9.6 oz (73.8 kg)   SpO2 98%   BMI 27.06 kg/m   Wt Readings from Last 3 Encounters:  07/10/22 162 lb 9.6 oz (73.8 kg)  07/03/22 160 lb (72.6 kg)  03/13/22 160 lb 1.6 oz (72.6 kg)    Physical Exam Vitals and nursing note reviewed.  Constitutional:      General: She is not in acute distress.    Appearance: Normal appearance. She is normal weight. She is not ill-appearing, toxic-appearing or diaphoretic.  HENT:     Head: Normocephalic.     Right Ear: External ear normal.     Left Ear: External ear normal.     Nose: Nose normal.     Mouth/Throat:     Mouth: Mucous membranes are moist.     Pharynx: Oropharynx is clear.  Eyes:     General:        Right eye: No discharge.        Left eye: No discharge.  Extraocular Movements: Extraocular movements intact.     Conjunctiva/sclera: Conjunctivae normal.     Pupils: Pupils are equal, round, and reactive to light.  Cardiovascular:     Rate and Rhythm: Normal rate and regular rhythm.     Heart sounds: No murmur heard. Pulmonary:     Effort: Pulmonary effort is normal. No respiratory distress.     Breath sounds: Normal breath sounds. No wheezing or rales.  Musculoskeletal:     Cervical back: Normal range of motion and neck supple.  Skin:    General: Skin is warm and dry.     Capillary Refill: Capillary refill takes less than 2 seconds.  Neurological:     General: No focal deficit present.     Mental Status: She is alert and oriented to person, place, and time. Mental status is at baseline.  Psychiatric:        Mood and Affect: Mood normal.        Behavior: Behavior normal.        Thought Content: Thought content normal.        Judgment: Judgment normal.     Results for orders placed or performed in visit on  02/23/22  Urine Culture   Specimen: Urine   UR  Result Value Ref Range   Urine Culture, Routine Final report (A)    Organism ID, Bacteria Escherichia coli (A)    Antimicrobial Susceptibility Comment   Microscopic Examination   Urine  Result Value Ref Range   WBC, UA >30W 0 - 5 /hpf   RBC, Urine 0-2 0 - 2 /hpf   Epithelial Cells (non renal) 0-10 0 - 10 /hpf   Mucus, UA Present (A) Not Estab.   Bacteria, UA Few None seen/Few  Urinalysis, Routine w reflex microscopic  Result Value Ref Range   Specific Gravity, UA >1.030 (H) 1.005 - 1.030   pH, UA 5.5 5.0 - 7.5   Color, UA Yellow Yellow   Appearance Ur Cloudy (A) Clear   Leukocytes,UA 3+ (A) Negative   Protein,UA 2+ (A) Negative/Trace   Glucose, UA Negative Negative   Ketones, UA Trace (A) Negative   RBC, UA Trace (A) Negative   Bilirubin, UA Negative Negative   Urobilinogen, Ur 0.2 0.2 - 1.0 mg/dL   Nitrite, UA Positive (A) Negative   Microscopic Examination See below:       Assessment & Plan:   Problem List Items Addressed This Visit       Digestive   Ulcerative colitis ([place]) - Primary    Chronic.  Followed by GI.  Well controlled on Mesalamine.  Continue to follow up with specialist.       Relevant Orders   Comp Met (CMET)   CBC w/Diff     Other   Overweight (BMI 25.0-29.9) (Chronic)    Recommended eating smaller high protein, low fat meals more frequently and exercising 30 mins a day 5 times a week with a goal of 10-15lb weight loss in the next 3 months.       Relevant Orders   Comp Met (CMET)   Low vitamin D level    Labs ordered at visit today.  Will make recommendations based on lab results.        Relevant Orders   Comp Met (CMET)   Vitamin D (25 hydroxy)   Ductal carcinoma in situ (DCIS) of left breast    Removed via surgery.  Did not need follow up treatment.  Does follow up with Oncology  at [facility].       Relevant Orders   Comp Met (CMET)   CBC w/Diff   Hyperlipidemia    Chronic.  Labs ordered  at visit today.  Will calculate ASCVD risk score once labs are back.         Relevant Orders   Lipid Profile   Other Visit Diagnoses     History of UTI       Will check UA at visit today. Will make recommendations based on lab results.   Relevant Orders   Urinalysis, Routine w reflex microscopic        Follow up plan: No follow-ups on file.

## 2022-07-10 NOTE — Assessment & Plan Note (Signed)
Recommended eating smaller high protein, low fat meals more frequently and exercising 30 mins a day 5 times a week with a goal of 10-15lb weight loss in the next 3 months.  

## 2022-07-10 NOTE — Assessment & Plan Note (Signed)
Chronic.  Followed by GI.  Well controlled on Mesalamine.  Continue to follow up with specialist.

## 2022-07-10 NOTE — Assessment & Plan Note (Signed)
Removed via surgery.  Did not need follow up treatment.  Does follow up with Oncology at [facility].

## 2022-07-11 LAB — CBC WITH DIFFERENTIAL/PLATELET
Basophils Absolute: 0 10*3/uL (ref 0.0–0.2)
Basos: 1 %
EOS (ABSOLUTE): 0.2 10*3/uL (ref 0.0–0.4)
Eos: 3 %
Hematocrit: 44.6 % (ref 34.0–46.6)
Hemoglobin: 14.3 g/dL (ref 11.1–15.9)
Immature Grans (Abs): 0 10*3/uL (ref 0.0–0.1)
Immature Granulocytes: 0 %
Lymphocytes Absolute: 1.6 10*3/uL (ref 0.7–3.1)
Lymphs: 26 %
MCH: 28.5 pg (ref 26.6–33.0)
MCHC: 32.1 g/dL (ref 31.5–35.7)
MCV: 89 fL (ref 79–97)
Monocytes Absolute: 0.6 10*3/uL (ref 0.1–0.9)
Monocytes: 10 %
Neutrophils Absolute: 3.6 10*3/uL (ref 1.4–7.0)
Neutrophils: 60 %
Platelets: 244 10*3/uL (ref 150–450)
RBC: 5.01 x10E6/uL (ref 3.77–5.28)
RDW: 12.7 % (ref 11.7–15.4)
WBC: 5.9 10*3/uL (ref 3.4–10.8)

## 2022-07-11 LAB — COMPREHENSIVE METABOLIC PANEL
ALT: 15 IU/L (ref 0–32)
AST: 15 IU/L (ref 0–40)
Albumin/Globulin Ratio: 1.8 (ref 1.2–2.2)
Albumin: 4.3 g/dL (ref 3.8–4.8)
Alkaline Phosphatase: 69 IU/L (ref 44–121)
BUN/Creatinine Ratio: 32 — ABNORMAL HIGH (ref 12–28)
BUN: 19 mg/dL (ref 8–27)
Bilirubin Total: 0.4 mg/dL (ref 0.0–1.2)
CO2: 25 mmol/L (ref 20–29)
Calcium: 9.5 mg/dL (ref 8.7–10.3)
Chloride: 105 mmol/L (ref 96–106)
Creatinine, Ser: 0.6 mg/dL (ref 0.57–1.00)
Globulin, Total: 2.4 g/dL (ref 1.5–4.5)
Glucose: 84 mg/dL (ref 70–99)
Potassium: 4.5 mmol/L (ref 3.5–5.2)
Sodium: 143 mmol/L (ref 134–144)
Total Protein: 6.7 g/dL (ref 6.0–8.5)
eGFR: 95 mL/min/{1.73_m2} (ref >59)

## 2022-07-11 LAB — LIPID PANEL
Chol/HDL Ratio: 2.8 ratio (ref 0.0–4.4)
Cholesterol, Total: 212 mg/dL — ABNORMAL HIGH (ref 100–199)
HDL: 75 mg/dL (ref >39)
LDL Chol Calc (NIH): 122 mg/dL — ABNORMAL HIGH (ref 0–99)
Triglycerides: 83 mg/dL (ref 0–149)
VLDL Cholesterol Cal: 15 mg/dL (ref 5–40)

## 2022-07-11 LAB — VITAMIN D 25 HYDROXY (VIT D DEFICIENCY, FRACTURES): Vit D, 25-Hydroxy: 72.8 ng/mL (ref 30.0–100.0)

## 2022-07-11 NOTE — Progress Notes (Signed)
Please let patient know that her cholesterol is elevated.  Her cardiac risk score puts her at high risk of having a stroke or heart attack over the next 10 years.  I recommend that she start a statin called crestor '5mg'$  daily.  The goal will be to increase this to '20mg'$  daily if patient tolerates it well.  If she agrees to the medication I can send it to the pharmacy.    Otherwise, her liver, kidneys and electrolytes look good.  Her vitamin D is within normal range.    The 10-year ASCVD risk score (Arnett DK, et al., 2019) is: 10.1%   Values used to calculate the score:     Age: 72 years     Sex: Female     Is Non-Hispanic African American: No     Diabetic: No     Tobacco smoker: No     Systolic Blood Pressure: XX123456 mmHg     Is BP treated: No     HDL Cholesterol: 75 mg/dL     Total Cholesterol: 212 mg/dL

## 2022-07-13 ENCOUNTER — Encounter: Payer: Self-pay | Admitting: Nurse Practitioner

## 2022-07-13 DIAGNOSIS — Z22358 Carrier of other enterobacterales: Secondary | ICD-10-CM

## 2022-07-13 LAB — URINE CULTURE

## 2022-07-13 MED ORDER — SULFAMETHOXAZOLE-TRIMETHOPRIM 800-160 MG PO TABS: 1.0000 | ORAL_TABLET | Freq: Two times a day (BID) | ORAL | 0 refills | Status: DC

## 2022-07-13 NOTE — Progress Notes (Signed)
Please let patient know that her urine did grow bacteria.  I have sent in a prescription for bactrim to treat this.

## 2022-07-13 NOTE — Addendum Note (Signed)
Addended by: Jon Billings on: 07/13/2022 04:41 PM   Modules accepted: Orders

## 2022-07-17 MED ORDER — DOXYCYCLINE HYCLATE 100 MG PO TABS: 100.0000 mg | ORAL_TABLET | Freq: Two times a day (BID) | ORAL | 0 refills | Status: DC

## 2022-07-27 ENCOUNTER — Ambulatory Visit: Payer: Medicare HMO | Attending: Infectious Diseases | Admitting: Infectious Diseases

## 2022-07-27 ENCOUNTER — Encounter: Payer: Self-pay | Admitting: Infectious Diseases

## 2022-07-27 VITALS — BP 117/73 | HR 54 | Temp 96.6°F | Ht 64.5 in | Wt 162.0 lb

## 2022-07-27 DIAGNOSIS — K5190 Ulcerative colitis, unspecified, without complications: Secondary | ICD-10-CM | POA: Insufficient documentation

## 2022-07-27 DIAGNOSIS — C50912 Malignant neoplasm of unspecified site of left female breast: Secondary | ICD-10-CM | POA: Insufficient documentation

## 2022-07-27 DIAGNOSIS — Z803 Family history of malignant neoplasm of breast: Secondary | ICD-10-CM | POA: Insufficient documentation

## 2022-07-27 DIAGNOSIS — R8271 Bacteriuria: Secondary | ICD-10-CM | POA: Diagnosis not present

## 2022-07-27 DIAGNOSIS — Z22358 Carrier of other enterobacterales: Secondary | ICD-10-CM | POA: Diagnosis not present

## 2022-07-27 NOTE — Patient Instructions (Addendum)
*  Estrace /Premarin cream topically- peasized apply topically three times a week- check with your oncologist or PCP because of breast ca * Cetaphil to clean the genital area ( not soap)  * Cranberry supplement (-Knudsen cranberry concentrate- 1 ounce mixed with 8 ounces of water *wash with water after bowel movt *Probiotic for vaginal health( can try Pearls vaginal health) * Increase water consumption- 8 glasses a day *Ask your doctors not to check your urine on a routine basis  *Avoid antibiotics unless systemic infection like fever/ or before cystoscopy * Kegel Exercise to strengthen pelvic floor

## 2022-07-27 NOTE — Progress Notes (Signed)
NAME: Shirley Lowe  DOB: Jul 03, 1950  MRN: [id number]  Date/Time: 07/27/2022 11:27 AM   Subjective:   ? Shirley Lowe is a 72 y.o. with a history of ulcerative colitis on mesalamine in remission, s/p sigmoid resection for diverticulitis, carcinoma in situ left breast with lumpectomy October 2023 bladder sling surgery is referred to me for ESBL e.coli in urine. She states since her preparation for colonoscopy few years ago she has been having E. coli in the urine.  Since 2021 she has had ESBL E. coli in the urine She underwent robotic assisted sigmoidectomy May 2022. She has not had symptoms on an ongoing basis.  The last 3 years she has had only 3 times frequency urgency and burning.  On 07/10/2022 the urine was checked to look for clearance and it had ESBL E. coli and she was placed on doxycycline   She has been asymptomatic.    Past Medical History:  Diagnosis Date   Breast cancer ([place])    Left DCI breast cancer   Cancer ([place])    Diverticulitis    Diverticulitis    Dyspnea    with exerton due to out of shape per pt    Osteoporosis    Ulcerative colitis     Past Surgical History:  Procedure Laterality Date   ABDOMINAL HYSTERECTOMY     BREAST BIOPSY Left 12/01/2021   Stereo bx, X-clip, path pending   CATARACT EXTRACTION W/PHACO Right 09/13/2021   Procedure: CATARACT EXTRACTION PHACO AND INTRAOCULAR LENS PLACEMENT ([place]) RIGHT RAYNOR LENS;  Surgeon: Birder Robson, MD;  Location: [place];  Service: Ophthalmology;  Laterality: Right;  5.32 00:40.7   CATARACT EXTRACTION W/PHACO Left 09/27/2021   Procedure: CATARACT EXTRACTION PHACO AND INTRAOCULAR LENS PLACEMENT ([place]) LEFT RAYNOR LENS;  Surgeon: Birder Robson, MD;  Location: [place];  Service: Ophthalmology;  Laterality: Left;  4.79 0:38.5   COLON RESECTION SIGMOID N/A    COLONOSCOPY WITH PROPOFOL N/A 11/12/2019   Procedure: COLONOSCOPY WITH PROPOFOL;  Surgeon: Virgel Manifold, MD;   Location: [facility];  Service: Endoscopy;  Laterality: N/A;   INCONTINENCE SURGERY     TONSILLECTOMY     TONSILLECTOMY      Social History   Socioeconomic History   Marital status: Married    Spouse name: [name]   Number of children: 3   Years of education: Not on file   Highest education level: Bachelor's degree (e.g., BA, AB, BS)  Occupational History   Occupation: Retired  Tobacco Use   Smoking status: Never   Smokeless tobacco: Never   Tobacco comments:    smoking cessation materials not required  Vaping Use   Vaping Use: Never used  Substance and Sexual Activity   Alcohol use: No   Drug use: No   Sexual activity: Not Currently  Other Topics Concern   Not on file  Social History Narrative   Not on file   Social Determinants of Health   Financial Resource Strain: Low Risk  (07/03/2022)   Overall Financial Resource Strain (CARDIA)    Difficulty of Paying Living Expenses: Not hard at all  Food Insecurity: No Food Insecurity (07/03/2022)   Hunger Vital Sign    Worried About Running Out of Food in the Last Year: Never true    [place] in the Last Year: Never true  Transportation Needs: No Transportation Needs (07/03/2022)   PRAPARE - Transportation    Lack of Transportation (Medical): No    Lack of  Transportation (Non-Medical): No  Physical Activity: Insufficiently Active (07/03/2022)   Exercise Vital Sign    Days of Exercise per Week: 2 days    Minutes of Exercise per Session: 20 min  Stress: No Stress Concern Present (07/03/2022)   [place]    Feeling of Stress : Not at all  Social Connections: Moderately Integrated (07/03/2022)   Social Connection and Isolation Panel [NHANES]    Frequency of Communication with Friends and Family: More than three times a week    Frequency of Social Gatherings with Friends and Family: Three times a week    Attends Religious Services: More than 4 times per year     Active Member of Clubs or Organizations: No    Attends Archivist Meetings: Never    Marital Status: Married  Human resources officer Violence: Not At Risk (07/03/2022)   Humiliation, Afraid, Rape, and Kick questionnaire    Fear of Current or Ex-Partner: No    Emotionally Abused: No    Physically Abused: No    Sexually Abused: No    Family History  Problem Relation Age of Onset   Cancer Mother        breast cancer   Breast cancer Mother 31       metastic   Cancer Father        pancreatic   Hepatitis C Sister    Thyroid disease Sister    Irritable bowel syndrome Sister    Allergies Sister    Allergies  Allergen Reactions   Penicillins     Has yeast infection   I? Current Outpatient Medications  Medication Sig Dispense Refill   acetaminophen (TYLENOL) 500 MG tablet Take 2 tablets (1,000 mg total) by mouth every 6 (six) hours. 30 tablet 0   ascorbic acid (VITAMIN C) 1000 MG tablet Take 1,000 mg by mouth daily.     B Complex Vitamins (VITAMIN B-COMPLEX) TABS Take 1 tablet by mouth daily.     [employer] CRANBERRY 500 MG CAPS      Magnesium 125 MG CAPS Take 125 mg by mouth daily.     mesalamine (LIALDA) 1.2 g EC tablet TAKE 1 TABLET BY MOUTH IN THE MORNING AND AT BEDTIME 180 tablet 3   Omega-3 Fatty Acids (FISH OIL) 1000 MG CAPS Take 1,000 mg by mouth daily.     vitamin E 100 UNIT capsule Take 100 Units by mouth daily.     zinc gluconate 50 MG tablet Take 50 mg by mouth daily.     No current facility-administered medications for this visit.     Abtx:  Anti-infectives (From admission, onward)    None       REVIEW OF SYSTEMS:  Const: negative fever, negative chills, negative weight loss Eyes: negative diplopia or visual changes, negative eye pain ENT: negative coryza, negative sore throat Resp: negative cough, hemoptysis, dyspnea Cards: negative for chest pain, palpitations, lower extremity edema GU: negative for frequency, dysuria and hematuria GI: Negative for abdominal  pain, diarrhea, bleeding, constipation Skin: negative for rash and pruritus Heme: negative for easy bruising and gum/nose bleeding MS: negative for myalgias, arthralgias, back pain and muscle weakness Neurolo:negative for headaches, dizziness, vertigo, memory problems  Psych: negative for feelings of anxiety, depression  Endocrine: negative for thyroid, diabetes Allergy/Immunology- PCN causes yeast infection  Objective:  VITALS:  BP 117/73   Pulse (!) 54   Temp (!) 96.6 F (35.9 C) (Temporal)   Ht 5' 4.5" (1.638  m)   Wt 162 lb (73.5 kg)   BMI 27.38 kg/m   PHYSICAL EXAM:  General: Alert, cooperative, no distress, appears stated age.  Head: Normocephalic, without obvious abnormality, atraumatic. Eyes: Conjunctivae clear, anicteric sclerae. Pupils are equal ENT Nares normal. No drainage or sinus tenderness. Lips, mucosa, and tongue normal. No Thrush Neck: Supple, symmetrical, no adenopathy, thyroid: non tender no carotid bruit and no JVD. Back: No CVA tenderness. Lungs: Clear to auscultation bilaterally. No Wheezing or Rhonchi. No rales. Heart: Regular rate and rhythm, no murmur, rub or gallop. Abdomen: Soft,  Extremities: atraumatic, no cyanosis. No edema. No clubbing Skin: No rashes or lesions. Or bruising Lymph: Cervical, supraclavicular normal. Neurologic: Grossly non-focal Pertinent Labs Lab Results CBC    Component Value Date/Time   WBC 5.9 07/10/2022 1038   WBC 9.4 09/24/2020 0446   RBC 5.01 07/10/2022 1038   RBC 3.94 09/24/2020 0446   HGB 14.3 07/10/2022 1038   HCT 44.6 07/10/2022 1038   PLT 244 07/10/2022 1038   MCV 89 07/10/2022 1038   MCH 28.5 07/10/2022 1038   MCH 29.7 09/24/2020 0446   MCHC 32.1 07/10/2022 1038   MCHC 32.4 09/24/2020 0446   RDW 12.7 07/10/2022 1038   LYMPHSABS 1.6 07/10/2022 1038   MONOABS 0.5 05/10/2020 1721   EOSABS 0.2 07/10/2022 1038   BASOSABS 0.0 07/10/2022 1038       Latest Ref Rng & Units 07/10/2022   10:38 AM 07/04/2021    10:23 AM 09/24/2020    4:46 AM  CMP  Glucose 70 - 99 mg/dL 84  81  80   BUN 8 - 27 mg/dL 19  18  14    Creatinine 0.57 - 1.00 mg/dL 0.60  0.71  0.57   Sodium 134 - 144 mmol/L 143  143  140   Potassium 3.5 - 5.2 mmol/L 4.5  4.0  3.9   Chloride 96 - 106 mmol/L 105  104  107   CO2 20 - 29 mmol/L 25  26  25    Calcium 8.7 - 10.3 mg/dL 9.5  9.3  8.5   Total Protein 6.0 - 8.5 g/dL 6.7  6.9    Total Bilirubin 0.0 - 1.2 mg/dL 0.4  0.4    Alkaline Phos 44 - 121 IU/L 69  57    AST 0 - 40 IU/L 15  15    ALT 0 - 32 IU/L 15  12        Microbiology: 3/11- UC- esbl ecoli UA 3/11 - 0-5 wbc   ? Impression/Recommendation E. coli in the urine.  Likely colonization  urine should not be sent for culture without any symptoms. Patient should not be treated for asymptomatic bacteriuria or pyuria Genitourinary syndrome of menopause can lead to colonization. Hence asked patient to follow few measures to prevent UTI.   * Cetaphil to clean the genital area ( not soap)  * Cranberry supplement (-Knudsen cranberry concentrate- 1 ounce mixed with 8 ounces of water *wash with water after bowel movt *Probiotic for vaginal health( can try Pearls vaginal health) * Increase water consumption- 8 glasses a day *Ask your doctors not to check your urine on a routine basis  *Avoid antibiotics unless systemic infection/ or before cystoscopy * Kegel Exercise to strengthen pelvic floor_ *Estrace /Premarin cream topically- peasized apply topically three times a week-as patient has breast cancer she has to check with her oncologist before this can be implemented  Ulcerative colitis in remission on mesalamine Sigmoidectomy's for secondary to diverticulitis  Breast cancer left status post partial mastectomy.  Patient had borderline cholesterol and she is implementing dietary changes first before pharmaceuticals.  __________________________________________________ Discussed with patient, and also can medicated with Ms.  Holdsworth her PCP  note:  This document was prepared using Dragon voice recognition software and may include unintentional dictation errors.

## 2023-01-08 ENCOUNTER — Other Ambulatory Visit: Payer: Self-pay | Admitting: Gastroenterology

## 2023-01-08 DIAGNOSIS — K5190 Ulcerative colitis, unspecified, without complications: Secondary | ICD-10-CM

## 2023-02-07 LAB — HM MAMMOGRAPHY

## 2023-02-15 ENCOUNTER — Telehealth: Payer: Self-pay | Admitting: Gastroenterology

## 2023-02-15 DIAGNOSIS — K5190 Ulcerative colitis, unspecified, without complications: Secondary | ICD-10-CM

## 2023-02-15 NOTE — Telephone Encounter (Signed)
Patient called in to schedule and office and get her medicine refill. She is schedule for 04/12/23 at 1:00 pm through Mychart.

## 2023-02-15 NOTE — Telephone Encounter (Signed)
What medication is she requesting and what pharmacy. Can you please get this information when patient call to request a refill

## 2023-02-16 MED ORDER — MESALAMINE 1.2 G PO TBEC: DELAYED_RELEASE_TABLET | ORAL | 0 refills | Status: DC

## 2023-02-16 NOTE — Telephone Encounter (Signed)
Sent medication to the pharmacy  

## 2023-02-16 NOTE — Addendum Note (Signed)
Addended by: Radene Knee L on: 02/16/2023 09:59 AM   Modules accepted: Orders

## 2023-02-16 NOTE — Telephone Encounter (Signed)
I called the patient to get the name of her medication (Mesalamine) 1.2 G EC Tablet.

## 2023-02-23 ENCOUNTER — Telehealth: Payer: Self-pay | Admitting: Gastroenterology

## 2023-02-23 NOTE — Telephone Encounter (Signed)
Patient called and left a voicemail to reschedule office visit. I called patient back to reschedule. We just change the patient time.

## 2023-03-18 ENCOUNTER — Emergency Department
Admission: EM | Admit: 2023-03-18 | Discharge: 2023-03-18 | Disposition: A | Discharge status: Home / Self Care | Payer: Medicare HMO | Attending: Emergency Medicine | Admitting: Emergency Medicine

## 2023-03-18 ENCOUNTER — Emergency Department: Payer: Medicare HMO

## 2023-03-18 ENCOUNTER — Other Ambulatory Visit: Payer: Self-pay

## 2023-03-18 DIAGNOSIS — Z853 Personal history of malignant neoplasm of breast: Secondary | ICD-10-CM | POA: Diagnosis not present

## 2023-03-18 DIAGNOSIS — R0789 Other chest pain: Secondary | ICD-10-CM | POA: Diagnosis not present

## 2023-03-18 DIAGNOSIS — R0602 Shortness of breath: Secondary | ICD-10-CM | POA: Diagnosis not present

## 2023-03-18 DIAGNOSIS — R079 Chest pain, unspecified: Secondary | ICD-10-CM

## 2023-03-18 DIAGNOSIS — R002 Palpitations: Secondary | ICD-10-CM | POA: Diagnosis present

## 2023-03-18 LAB — CBC
HCT: 45.3 % (ref 36.0–46.0)
Hemoglobin: 14.5 g/dL (ref 12.0–15.0)
MCH: 28.9 pg (ref 26.0–34.0)
MCHC: 32 g/dL (ref 30.0–36.0)
MCV: 90.4 fL (ref 80.0–100.0)
Platelets: 252 10*3/uL (ref 150–400)
RBC: 5.01 MIL/uL (ref 3.87–5.11)
RDW: 13.2 % (ref 11.5–15.5)
WBC: 6.9 10*3/uL (ref 4.0–10.5)
nRBC: 0 % (ref 0.0–0.2)

## 2023-03-18 LAB — BASIC METABOLIC PANEL
Anion gap: 9 (ref 5–15)
BUN: 30 mg/dL — ABNORMAL HIGH (ref 8–23)
CO2: 27 mmol/L (ref 22–32)
Calcium: 9.7 mg/dL (ref 8.9–10.3)
Chloride: 103 mmol/L (ref 98–111)
Creatinine, Ser: 0.87 mg/dL (ref 0.44–1.00)
GFR, Estimated: >60 mL/min (ref >60)
Glucose, Bld: 100 mg/dL — ABNORMAL HIGH (ref 70–99)
Potassium: 4 mmol/L (ref 3.5–5.1)
Sodium: 139 mmol/L (ref 135–145)

## 2023-03-18 LAB — HEPATIC FUNCTION PANEL
ALT: 21 U/L (ref 0–44)
AST: 23 U/L (ref 15–41)
Albumin: 4.2 g/dL (ref 3.5–5.0)
Alkaline Phosphatase: 59 U/L (ref 38–126)
Bilirubin, Direct: 0.1 mg/dL (ref 0.0–0.2)
Indirect Bilirubin: 0.6 mg/dL (ref 0.3–0.9)
Total Bilirubin: 0.7 mg/dL (ref <1.2)
Total Protein: 7.8 g/dL (ref 6.5–8.1)

## 2023-03-18 LAB — TROPONIN I (HIGH SENSITIVITY): Troponin I (High Sensitivity): 5 ng/L (ref <18)

## 2023-03-18 LAB — LIPASE, BLOOD: Lipase: 40 U/L (ref 11–51)

## 2023-03-18 LAB — T4, FREE: Free T4: 0.97 ng/dL (ref 0.61–1.12)

## 2023-03-18 LAB — MAGNESIUM: Magnesium: 2.2 mg/dL (ref 1.7–2.4)

## 2023-03-18 LAB — TSH: TSH: 3.434 u[IU]/mL (ref 0.350–4.500)

## 2023-03-18 NOTE — ED Provider Notes (Signed)
[facility] Provider Note    Event Date/Time   First MD Initiated Contact with Patient 03/18/23 [phone]     (approximate)   History   Tachycardia (X 2 weeks)   HPI  Shirley Lowe is a 72 y.o. female   Past medical history of left DCI breast cancer status post resection only, no further treatment currently, osteoporosis, ulcerative colitis, presents to the emergency department with intermittent nighttime palpitations.  Sensation there is a flutter in her chest.  She has associated mild shortness of breath and chest discomfort during these episodes that spontaneously resolve and then she is completely asymptomatic during the daytime and in between these episodes.  They have been happening nightly for the last couple of weeks.  She feels well now, completely asymptomatic.  Denies chest pain.  Denies shortness of breath cough or fever.  No changes in medications, excessive caffeine, drug or alcohol use.  External Medical Documents Reviewed: Oncology note documenting her breast cancer status post surgery resection      Physical Exam   Triage Vital Signs: ED Triage Vitals  Encounter Vitals Group     BP 03/18/23 0142 (!) 116/102     Systolic BP Percentile --      Diastolic BP Percentile --      Pulse Rate 03/18/23 0142 65     Resp 03/18/23 0142 16     Temp 03/18/23 0142 97.6 F (36.4 C)     Temp Source 03/18/23 0147 Oral     SpO2 03/18/23 0142 99 %     Weight --      Height 03/18/23 0136 5\' 4"  (1.626 m)     Head Circumference --      Peak Flow --      Pain Score 03/18/23 0136 0     Pain Loc --      Pain Education --      Exclude from Growth Chart --     Most recent vital signs: Vitals:   03/18/23 0300 03/18/23 0330  BP: 119/63 (!) 103/91  Pulse: 62 60  Resp: 19 14  Temp:    SpO2: 96% 100%    General: Awake, no distress.  CV:  Good peripheral perfusion.  Resp:  Normal effort.  Abd:  No distention.  Other:     ED Results /  Procedures / Treatments   Labs (all labs ordered are listed, but only abnormal results are displayed) Labs Reviewed  BASIC METABOLIC PANEL - Abnormal; Notable for the following components:      Result Value   Glucose, Bld 100 (*)    BUN 30 (*)    All other components within normal limits  CBC  HEPATIC FUNCTION PANEL  LIPASE, BLOOD  MAGNESIUM  TSH  T4, FREE  TROPONIN I (HIGH SENSITIVITY)  TROPONIN I (HIGH SENSITIVITY)     I ordered and reviewed the above labs they are notable for initial troponin negative, cell counts electrolytes and thyroid function tests are all normal.  EKG  ED ECG REPORT I, Pilar Jarvis, the attending physician, personally viewed and interpreted this ECG.   Date: 03/18/2023  EKG Time: 0138  Rate: 68  Rhythm: sinus  Axis: nl  Intervals:none  ST&T Change: no stemi    RADIOLOGY I independently reviewed and interpreted chest x-ray and I see no obvious focality or pneumothorax I also reviewed radiologist's formal read.   PROCEDURES:  Critical Care performed: No  Procedures   MEDICATIONS ORDERED IN ED: Medications -  No data to display   IMPRESSION / MDM / ASSESSMENT AND PLAN / ED COURSE  I reviewed the triage vital signs and the nursing notes.                                Patient's presentation is most consistent with acute presentation with potential threat to life or bodily function.  Differential diagnosis includes, but is not limited to, dysrhythmia, electrolyte disturbance, ACS, thyroid dysfunction   The patient is on the cardiac monitor to evaluate for evidence of arrhythmia and/or significant heart rate changes.  MDM:    Intermittent palpitations at night, associated with some shortness of breath and chest discomfort in a 72 year old with no significant cardiac risk factors.  EKG without any signs of ischemia or malignant dysrhythmias and lab analysis including thyroid, electrolytes, troponin all within normal limits.  I will  defer second troponin given no current chest pain, and chronicity of symptoms should have resulted in an elevated troponin if a result of cardiac ischemia which I have low clinical suspicion it is.  Given unremarkable workup completely asymptomatic now, plan will be for discharge and close follow-up with PMD and cardiology referral for Holter monitoring.       FINAL CLINICAL IMPRESSION(S) / ED DIAGNOSES   Final diagnoses:  Palpitations  Nonspecific chest pain     Rx / DC Orders   ED Discharge Orders          Ordered    Ambulatory referral to Cardiology       Comments: If you have not heard from the Cardiology office within the next 72 hours please call [phone].   03/18/23 0350             Note:  This document was prepared using Dragon voice recognition software and may include unintentional dictation errors.    Pilar Jarvis, MD 03/18/23 [phone]

## 2023-03-18 NOTE — ED Notes (Signed)
Blue top sent down. 

## 2023-03-18 NOTE — ED Notes (Signed)
Patient given discharge instructions including importance of follow up appt with cardiology with stated understanding. Patient stable and ambulatory with steady even gait on dispo.

## 2023-03-18 NOTE — Discharge Instructions (Addendum)
Fortunately your testing in the emergency department did not show any emergency conditions to account for your symptoms.  Please call your primary doctor for a follow-up appointment regarding your palpitations and ask for Holter monitoring.  I also made a referral to a cardiologist who can further assess your palpitations, can order the Holter monitor, and also talk to you about your chest discomfort for further heart testing.  Thank you for choosing [place] for your health care today!  Please see your primary doctor this week for a follow up appointment.   If you have any new, worsening, or unexpected symptoms call your doctor right away or come back to the emergency department for reevaluation.  It was my pleasure to care for you today.   Daneil Dan Modesto Charon, MD

## 2023-03-18 NOTE — ED Triage Notes (Signed)
Pt to ed from home via POV for "racing heart and flutters" and it has been going off and on x [redacted] weeks along with some CP that radiates into her jaw. However pt advised she now has no symptoms by the time she arrived in the ER. Pt is caox4, in no acute distress and ambulatory in triage. Pt denies any HX of cardiac issues.

## 2023-03-20 ENCOUNTER — Ambulatory Visit: Payer: Medicare HMO | Admitting: Family Medicine

## 2023-03-20 VITALS — BP 100/60 | HR 60 | Temp 98.1°F | Ht 63.78 in | Wt 167.2 lb

## 2023-03-20 DIAGNOSIS — R002 Palpitations: Secondary | ICD-10-CM | POA: Diagnosis not present

## 2023-03-20 NOTE — Progress Notes (Unsigned)
BP 100/60   Pulse 60   Temp 98.1 F (36.7 C) (Oral)   Ht 5' 3.78" (1.62 m)   Wt 167 lb 3.2 oz (75.8 kg)   SpO2 98%   BMI 28.90 kg/m    Subjective:    Patient ID: Shirley Lowe, female    DOB: 09-12-1950, 72 y.o.   MRN: [id number]  HPI: Shirley Lowe is a 72 y.o. female  No chief complaint on file.   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  Sensation there is a flutter in her chest.  She has associated mild shortness of breath and chest discomfort during these episodes that spontaneously resolve and then she is completely asymptomatic during the daytime and in between these episodes.   Denies caffeine intake, drinks water frequently 1 bottle daily.   Visited Ed Sunday night and symptoms started Saturday night. Since discharge from the hospital she has not had any episodes of tachycardia, light headedness Denies dizziness, cp,   ER FOLLOW UP Time since discharge: 03/18/2023 at 0350 Hospital/facility:  Diagnosis:  Procedures/tests:  Consultants:  New medications:  Discharge instructions:   Status: stable   Do you need cardiology for holter monitor or   Relevant past medical, surgical, family and social history reviewed and updated as indicated. Interim medical history since our last visit reviewed. Allergies and medications reviewed and updated.  Review of Systems  Constitutional:  Negative for fatigue.  Eyes:  Negative for visual disturbance.  Respiratory:  Positive for shortness of breath (w exertion).   Cardiovascular: Negative.   Neurological:  Negative for dizziness, syncope, weakness, light-headedness and headaches.    Per HPI unless specifically indicated above     Objective:    BP 100/60   Pulse 60   Temp 98.1 F (36.7 C) (Oral)   Ht 5' 3.78" (1.62 m)   Wt 167 lb  3.2 oz (75.8 kg)   SpO2 98%   BMI 28.90 kg/m   Wt Readings from Last 3 Encounters:  03/20/23 167 lb 3.2 oz (75.8 kg)  07/27/22 162 lb (73.5 kg)  07/10/22 162 lb 9.6 oz (73.8 kg)    Physical Exam Vitals and nursing note reviewed.  Constitutional:      General: She is awake. She is not in acute distress.    Appearance: Normal appearance. She is well-developed and well-groomed. She is not ill-appearing.  HENT:     Head: Normocephalic and atraumatic.     Right Ear: Hearing and external ear normal. No drainage.     Left Ear: Hearing and external ear normal. No drainage.     Nose: Nose normal.  Eyes:     General: Lids are normal.        Right eye: No discharge.        Left eye: No discharge.     Conjunctiva/sclera: Conjunctivae normal.  Cardiovascular:     Rate and Rhythm: Normal rate and regular rhythm.     Heart sounds: Normal heart sounds, S1 normal and S2 normal. No murmur heard.    No gallop.  Pulmonary:     Effort: Pulmonary effort is normal. No accessory muscle usage or respiratory distress.     Breath sounds: Normal breath sounds.  Musculoskeletal:        General: Normal range of motion.     Cervical back: Full passive range of motion without  pain and normal range of motion.     Right lower leg: No edema.     Left lower leg: No edema.  Skin:    General: Skin is warm and dry.     Capillary Refill: Capillary refill takes less than 2 seconds.  Neurological:     Mental Status: She is alert and oriented to person, place, and time.  Psychiatric:        Attention and Perception: Attention normal.        Mood and Affect: Mood normal.        Speech: Speech normal.        Behavior: Behavior normal. Behavior is cooperative.        Thought Content: Thought content normal.     Results for orders placed or performed during the hospital encounter of 03/18/23  Basic metabolic panel  Result Value Ref Range   Sodium 139 135 - 145 mmol/L   Potassium 4.0 3.5 - 5.1 mmol/L    Chloride 103 98 - 111 mmol/L   CO2 27 22 - 32 mmol/L   Glucose, Bld 100 (H) 70 - 99 mg/dL   BUN 30 (H) 8 - 23 mg/dL   Creatinine, Ser 6.04 0.44 - 1.00 mg/dL   Calcium 9.7 8.9 - 54.0 mg/dL   GFR, Estimated >98 >11 mL/min   Anion gap 9 5 - 15  CBC  Result Value Ref Range   WBC 6.9 4.0 - 10.5 K/uL   RBC 5.01 3.87 - 5.11 MIL/uL   Hemoglobin 14.5 12.0 - 15.0 g/dL   HCT 91.4 78.2 - 95.6 %   MCV 90.4 80.0 - 100.0 fL   MCH 28.9 26.0 - 34.0 pg   MCHC 32.0 30.0 - 36.0 g/dL   RDW 21.3 08.6 - 57.8 %   Platelets 252 150 - 400 K/uL   nRBC 0.0 0.0 - 0.2 %  Hepatic function panel  Result Value Ref Range   Total Protein 7.8 6.5 - 8.1 g/dL   Albumin 4.2 3.5 - 5.0 g/dL   AST 23 15 - 41 U/L   ALT 21 0 - 44 U/L   Alkaline Phosphatase 59 38 - 126 U/L   Total Bilirubin 0.7 <1.2 mg/dL   Bilirubin, Direct 0.1 0.0 - 0.2 mg/dL   Indirect Bilirubin 0.6 0.3 - 0.9 mg/dL  Lipase, blood  Result Value Ref Range   Lipase 40 11 - 51 U/L  Magnesium  Result Value Ref Range   Magnesium 2.2 1.7 - 2.4 mg/dL  TSH  Result Value Ref Range   TSH 3.434 0.350 - 4.500 uIU/mL  T4, free  Result Value Ref Range   Free T4 0.97 0.61 - 1.12 ng/dL  Troponin I (High Sensitivity)  Result Value Ref Range   Troponin I (High Sensitivity) 5 <18 ng/L      Assessment & Plan:   Problem List Items Addressed This Visit   None Visit Diagnoses     Palpitations    -  Primary   Relevant Orders   LONG TERM MONITOR (3-14 DAYS)   Ambulatory referral to Cardiology        Follow up plan: Return if symptoms worsen or fail to improve.

## 2023-03-20 NOTE — Patient Instructions (Addendum)
Do at least 150 minutes of physical activity weekly  30 minutes a day for 5 days   Water intake should be at least 70 ounces daily

## 2023-03-21 ENCOUNTER — Ambulatory Visit: Payer: Medicare HMO | Attending: Family Medicine

## 2023-03-21 DIAGNOSIS — R002 Palpitations: Secondary | ICD-10-CM | POA: Insufficient documentation

## 2023-03-21 NOTE — Assessment & Plan Note (Signed)
Acute,stable. Denies symptoms occurring after ED visit. Order placed for 14 day Zio monitor and new referral placed for cardiology. Instructed to follow up with cardiology as soon as available to. Recommend monitoring time/day sensation occurs and increase water intake to 70 oz daily.

## 2023-03-26 DIAGNOSIS — R002 Palpitations: Secondary | ICD-10-CM

## 2023-04-04 ENCOUNTER — Telehealth: Payer: Medicare HMO | Admitting: Gastroenterology

## 2023-04-04 ENCOUNTER — Encounter: Payer: Self-pay | Admitting: Gastroenterology

## 2023-04-04 DIAGNOSIS — K51311 Ulcerative (chronic) rectosigmoiditis with rectal bleeding: Secondary | ICD-10-CM

## 2023-04-04 NOTE — Progress Notes (Signed)
Lannette Donath, MD [address]  [address]  [place], [place] [address]  Main: [phone]  Fax: [phone]    Gastroenterology Consultation Video Visit  Referring Provider:     Larae Grooms, NP Primary Care Physician:  Larae Grooms, NP Primary Gastroenterologist:  Dr. Arlyss Repress Reason for Consultation: Ulcerative colitis        HPI:   Shirley Lowe is a 72 y.o. female referred by Larae Grooms, NP  for consultation & management of ulcerative colitis  Virtual Visit Video Note  I connected with Junie Bame on 04/04/23 at  3:30 PM EST by video and verified that I am speaking with the correct person using two identifiers.   I discussed the limitations, risks, security and privacy concerns of performing an evaluation and management service by video and the availability of in person appointments. I also discussed with the patient that there may be a patient responsible charge related to this service. The patient expressed understanding and agreed to proceed.  Location of the Patient: Home  Location of the provider: Office  Persons participating in the visit: Patient and provider only   History of Present Illness: Ms. Trees is a 72 year old female with ulcerative colitis diagnosed in [place] more than a decade ago, currently maintained on Lialda 1.2 g twice daily with clinical and endoscopic remission. She had history of perforated sigmoid diverticulitis s/p sigmoid colectomy on 09/22/2020.  Patient has been doing well since surgery.  She reports having regular bowel movements.  Taking Lialda every day  NSAIDs: None  Antiplts/Anticoagulants/Anti thrombotics: None  GI Procedures: Colonoscopy in 03/2020, normal except for sigmoid stricture  Past Medical History:  Diagnosis Date   Breast cancer (HCC)    Left DCI breast cancer   Cancer (HCC)    Diverticulitis    Diverticulitis    Dyspnea    with exerton due to out of shape per pt     Osteoporosis    Ulcerative colitis     Past Surgical History:  Procedure Laterality Date   ABDOMINAL HYSTERECTOMY     BREAST BIOPSY Left 12/01/2021   Stereo bx, X-clip, path pending   CATARACT EXTRACTION W/PHACO Right 09/13/2021   Procedure: CATARACT EXTRACTION PHACO AND INTRAOCULAR LENS PLACEMENT (IOC) RIGHT RAYNOR LENS;  Surgeon: Galen Manila, MD;  Location: [facility];  Service: Ophthalmology;  Laterality: Right;  5.32 00:40.7   CATARACT EXTRACTION W/PHACO Left 09/27/2021   Procedure: CATARACT EXTRACTION PHACO AND INTRAOCULAR LENS PLACEMENT (IOC) LEFT RAYNOR LENS;  Surgeon: Galen Manila, MD;  Location: [facility];  Service: Ophthalmology;  Laterality: Left;  4.79 0:38.5   COLON RESECTION SIGMOID N/A    COLONOSCOPY WITH PROPOFOL N/A 11/12/2019   Procedure: COLONOSCOPY WITH PROPOFOL;  Surgeon: Pasty Spillers, MD;  Location: [facility];  Service: Endoscopy;  Laterality: N/A;   INCONTINENCE SURGERY     TONSILLECTOMY     TONSILLECTOMY       Current Outpatient Medications:    acetaminophen (TYLENOL) 500 MG tablet, Take 2 tablets (1,000 mg total) by mouth every 6 (six) hours., Disp: 30 tablet, Rfl: 0   ascorbic acid (VITAMIN C) 1000 MG tablet, Take 1,000 mg by mouth daily., Disp: , Rfl:    B Complex Vitamins (VITAMIN B-COMPLEX) TABS, Take 1 tablet by mouth daily., Disp: , Rfl:    [employer] CRANBERRY 500 MG CAPS, , Disp: , Rfl:    Magnesium 125 MG CAPS, Take 125 mg by mouth daily., Disp: , Rfl:  mesalamine (LIALDA) 1.2 g EC tablet, TAKE 1 TABLET BY MOUTH IN THE MORNING AND AT BEDTIME, Disp: 180 tablet, Rfl: 0   Omega-3 Fatty Acids (FISH OIL) 1000 MG CAPS, Take 1,000 mg by mouth daily., Disp: , Rfl:    vitamin E 100 UNIT capsule, Take 100 Units by mouth daily., Disp: , Rfl:    zinc gluconate 50 MG tablet, Take 50 mg by mouth daily., Disp: , Rfl:    Family History  Problem Relation Age of Onset   Cancer Mother        breast cancer   Breast cancer  Mother 25       metastic   Cancer Father        pancreatic   Hepatitis C Sister    Thyroid disease Sister    Irritable bowel syndrome Sister    Allergies Sister      Social History   Tobacco Use   Smoking status: Never   Smokeless tobacco: Never   Tobacco comments:    smoking cessation materials not required  Vaping Use   Vaping status: Never Used  Substance Use Topics   Alcohol use: No   Drug use: No    Allergies as of 04/04/2023 - Review Complete 03/18/2023  Allergen Reaction Noted   Penicillins  05/01/2011    Imaging Studies: Reviewed  Assessment and Plan:   CORTNEI REEVER is a 72 y.o. female with ulcerative colitis in remission, maintained on Lialda 1.2 g twice daily, history of sigmoid colectomy Continue current dose of Lialda   Follow Up Instructions:   I discussed the assessment and treatment plan with the patient. The patient was provided an opportunity to ask questions and all were answered. The patient agreed with the plan and demonstrated an understanding of the instructions.   The patient was advised to call back or seek an in-person evaluation if the symptoms worsen or if the condition fails to improve as anticipated.  I provided 10 minutes of face-to-face time during this encounter.   Follow up annually   Arlyss Repress, MD

## 2023-04-12 ENCOUNTER — Telehealth: Payer: Medicare HMO | Admitting: Gastroenterology

## 2023-04-23 ENCOUNTER — Ambulatory Visit: Payer: Medicare HMO | Attending: Cardiology | Admitting: Cardiology

## 2023-04-23 ENCOUNTER — Encounter: Payer: Self-pay | Admitting: Cardiology

## 2023-04-23 VITALS — BP 110/66 | HR 66 | Ht 64.0 in | Wt 165.4 lb

## 2023-04-23 DIAGNOSIS — D6869 Other thrombophilia: Secondary | ICD-10-CM

## 2023-04-23 DIAGNOSIS — I480 Paroxysmal atrial fibrillation: Secondary | ICD-10-CM | POA: Diagnosis not present

## 2023-04-23 MED ORDER — APIXABAN 5 MG PO TABS: 5.0000 mg | ORAL_TABLET | Freq: Two times a day (BID) | ORAL | Status: DC

## 2023-04-23 MED ORDER — APIXABAN 5 MG PO TABS: 5.0000 mg | ORAL_TABLET | Freq: Two times a day (BID) | ORAL | 3 refills | Status: DC

## 2023-04-23 NOTE — Patient Instructions (Signed)
Medication Instructions:  Your physician has recommended you make the following change in your medication:  1) START taking Eliquis 5 mg twice daily  *If you need a refill on your cardiac medications before your next appointment, please call your pharmacy   Testing/Procedures: Your physician has requested that you have a stress echocardiogram. For further information please visit [email or web address] Please follow instruction sheet as given.  Please note: We ask at that you not bring children with you during ultrasound (echo/ vascular) testing. Due to room size and safety concerns, children are not allowed in the ultrasound rooms during exams. Our front office staff cannot provide observation of children in our lobby area while testing is being conducted. An adult accompanying a patient to their appointment will only be allowed in the ultrasound room at the discretion of the ultrasound technician under special circumstances. We apologize for any inconvenience.  Follow-Up: At [facility], you and your health needs are our priority.  As part of our continuing mission to provide you with exceptional heart care, we have created designated Provider Care Teams.  These Care Teams include your primary Cardiologist (physician) and Advanced Practice Providers (APPs -  Physician Assistants and Nurse Practitioners) who all work together to provide you with the care you need, when you need it.   Your next appointment:   3 months  Provider:   Nobie Putnam, MD

## 2023-04-23 NOTE — Progress Notes (Signed)
Electrophysiology Office Note:   Date:  04/23/2023  ID:  LUKISHA STRATMAN, DOB 12-Jun-1950, MRN [id number]  Primary Cardiologist: None Primary Heart Failure: None Electrophysiologist: None      History of Present Illness:   Shirley Lowe is a 72 y.o. 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  Patient reports that she has had intermittent episodes of palpitations for the past few years.  Initially occurring only once a year or so but increasing in frequency.  Now she reports that she has palpitations occurring weekly which can last up to hours in duration.  She describes these episodes as feeling like her heart is racing and accompanied by chest discomfort, lightheadedness and fatigue.  She had complained of palpitations to her primary care provider and a Zio monitor was ordered.  Monitor revealed an episode of atrial fibrillation lasting 3 hours, during which she was symptomatic.  She is otherwise doing relatively well.  No shortness of breath or lower extremity edema.  No new or acute complaints.  Review of systems complete and found to be negative unless listed in HPI.   EP Information / Studies Reviewed:    EKG is ordered today. Personal review as below.  EKG Interpretation Date/Time:  Monday April 23 2023 14:17:09 EST Ventricular Rate:  66 PR Interval:  156 QRS Duration:  74 QT Interval:  370 QTC Calculation: 387 R Axis:   19  Text Interpretation: Normal sinus rhythm Nonspecific T wave abnormality When compared with ECG of 18-Mar-2023 01:38, No significant change was found Confirmed by Nobie Putnam [phone]) on 04/23/2023 2:36:17 PM   Zio Monitor 04/2023  HR 46 - 193, average 68 bpm. 56 nonsustained SVT, longest 10.2 seconds with an average rate of 135 bpm. <1% burden of atrial fibrillation, average rate 122 bpm. 1 AF  episode lasting 3 hours. Rare supraventricular and ventricular ectopy. Symptom trigger episodes correspond to atrial fibrillation.  Risk Assessment/Calculations:    CHA2DS2-VASc Score = 2   This indicates a 2.2% annual risk of stroke. The patient's score is based upon: CHF History: 0 HTN History: 0 Diabetes History: 0 Stroke History: 0 Vascular Disease History: 0 Age Score: 1 Gender Score: 1              Physical Exam:   VS:  BP 110/66   Pulse 66   Ht 5\' 4"  (1.626 m)   Wt 165 lb 6.4 oz (75 kg)   SpO2 95%   BMI 28.39 kg/m    Wt Readings from Last 3 Encounters:  04/23/23 165 lb 6.4 oz (75 kg)  03/20/23 167 lb 3.2 oz (75.8 kg)  07/27/22 162 lb (73.5 kg)     GEN: Well nourished, well developed in no acute distress NECK: No JVD CARDIAC: Normal rate and regular rhythm. RESPIRATORY:  Clear to auscultation without rales, wheezing or rhonchi  ABDOMEN: Soft, non-tender, non-distended EXTREMITIES:  No edema; No deformity   ASSESSMENT AND PLAN:   Shirley Lowe is a 72 y.o. 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  #Paroxsymal atrial fibrillation, symptomatic: This is a new diagnosis based on Zio ordered for palpitations. -We discussed her new atrial fibrillation diagnosis extensively.  Patient would prefer rhythm control strategy.  We discussed both antiarrhythmic  drug therapy and catheter ablation.  She is a suitable candidate for catheter ablation should she choose to do so.  At this time, patient would like to try medical therapy first. -She has no imaging in the chart.  Will obtain a stress echocardiogram.  If LVEF is normal and there is no evidence of ischemia or infarction then we will start flecainide 100 twice daily and metoprolol XL 25 mg once daily.  #Secondary hypercoagulable state due to atrial fibrillation:   -Her CHA2DS2-VASc score is 2.  We discussed the risks and benefits of anticoagulation and patient would like to start stroke prophylaxis. -Eliquis 5 mg twice daily ordered.   Follow up with Dr. Jimmey Ralph  in 3 months.   Signed, Nobie Putnam, MD

## 2023-05-19 ENCOUNTER — Other Ambulatory Visit: Payer: Self-pay | Admitting: Gastroenterology

## 2023-05-19 DIAGNOSIS — K5190 Ulcerative colitis, unspecified, without complications: Secondary | ICD-10-CM

## 2023-06-08 ENCOUNTER — Telehealth (HOSPITAL_COMMUNITY): Payer: Self-pay | Admitting: *Deleted

## 2023-06-08 NOTE — Telephone Encounter (Signed)
 Pt given instructions for stress echo.

## 2023-06-11 ENCOUNTER — Ambulatory Visit: Payer: Medicare HMO | Admitting: Internal Medicine

## 2023-06-14 ENCOUNTER — Ambulatory Visit (HOSPITAL_BASED_OUTPATIENT_CLINIC_OR_DEPARTMENT_OTHER)
Admission: RE | Admit: 2023-06-14 | Discharge: 2023-06-14 | Disposition: A | Discharge status: Home / Self Care | Payer: Medicare HMO | Source: Ambulatory Visit | Attending: Cardiology | Admitting: Cardiology

## 2023-06-14 ENCOUNTER — Ambulatory Visit (HOSPITAL_COMMUNITY): Payer: Medicare HMO | Attending: Cardiology

## 2023-06-14 DIAGNOSIS — I480 Paroxysmal atrial fibrillation: Secondary | ICD-10-CM | POA: Insufficient documentation

## 2023-06-14 MED ORDER — PERFLUTREN LIPID MICROSPHERE
6.0000 mL | INTRAVENOUS | Status: AC | PRN
  Administered 2023-06-14: 6 mL via INTRAVENOUS

## 2023-06-15 MED FILL — Perflutren Lipid Microsphere IV Susp 1.1 MG/ML: INTRAVENOUS | Qty: 6 | Status: AC

## 2023-06-15 MED FILL — Perflutren Lipid Microsphere IV Susp 1.1 MG/ML: INTRAVENOUS | Qty: 10 | Status: CN

## 2023-06-19 ENCOUNTER — Encounter: Payer: Self-pay | Admitting: Cardiology

## 2023-06-19 DIAGNOSIS — I480 Paroxysmal atrial fibrillation: Secondary | ICD-10-CM

## 2023-06-19 DIAGNOSIS — R002 Palpitations: Secondary | ICD-10-CM

## 2023-06-20 NOTE — Telephone Encounter (Signed)
The patient has been notified of the result and verbalized understanding.  All questions (if any) were answered. Frutoso Schatz, RN 06/20/2023 12:12 PM  Coronary CTA has been ordered.  Patient also reports that she has not been tolerating Eliquis. She states that she has been having stomach issues ever since she started taking it. She also reports some lightheadedness and some swelling and pain behind her knee.  She does not monitor her blood pressure and heart rate at home. She denies any signs or symptoms of bleeding. Will make Dr. Jimmey Ralph aware for any recommendations.

## 2023-06-20 NOTE — Telephone Encounter (Signed)
 Pt returning call

## 2023-06-26 ENCOUNTER — Encounter (HOSPITAL_COMMUNITY): Payer: Self-pay

## 2023-06-26 ENCOUNTER — Telehealth (HOSPITAL_COMMUNITY): Payer: Self-pay | Admitting: *Deleted

## 2023-06-26 MED ORDER — METOPROLOL TARTRATE 25 MG PO TABS: ORAL_TABLET | ORAL | 0 refills | Status: DC

## 2023-06-26 NOTE — Telephone Encounter (Signed)
 Attempted to call patient regarding upcoming cardiac CT appointment. Left message on voicemail with name and callback number  Larey Brick RN Navigator Cardiac Imaging [facility] [phone] Office [phone] Cell

## 2023-06-28 ENCOUNTER — Ambulatory Visit
Admission: RE | Admit: 2023-06-28 | Discharge: 2023-06-28 | Disposition: A | Discharge status: Home / Self Care | Payer: Medicare HMO | Source: Ambulatory Visit | Attending: Cardiology | Admitting: Cardiology

## 2023-06-28 DIAGNOSIS — R002 Palpitations: Secondary | ICD-10-CM | POA: Insufficient documentation

## 2023-06-28 DIAGNOSIS — I480 Paroxysmal atrial fibrillation: Secondary | ICD-10-CM | POA: Insufficient documentation

## 2023-06-28 DIAGNOSIS — D6869 Other thrombophilia: Secondary | ICD-10-CM | POA: Insufficient documentation

## 2023-06-28 MED ORDER — IOHEXOL 350 MG/ML SOLN
75.0000 mL | Freq: Once | INTRAVENOUS | Status: AC | PRN
  Administered 2023-06-28: 75 mL via INTRAVENOUS

## 2023-06-28 MED ORDER — NITROGLYCERIN 0.4 MG SL SUBL
0.8000 mg | SUBLINGUAL_TABLET | Freq: Once | SUBLINGUAL | Status: AC
  Administered 2023-06-28: 0.8 mg via SUBLINGUAL

## 2023-06-28 MED ORDER — SODIUM CHLORIDE 0.9 % IV BOLUS
150.0000 mL | Freq: Once | INTRAVENOUS | Status: AC
  Administered 2023-06-28: 150 mL via INTRAVENOUS

## 2023-06-28 NOTE — Progress Notes (Signed)
 Patient tolerated procedure well. Ambulate w/o difficulty. Denies light headedness or being dizzy. Sitting up drinking water provided. Encouraged to drink extra water today and reasoning explained. Verbalized understanding. All questions answered. ABC intact. No further needs. Discharge from procedure area w/o issues.

## 2023-07-01 ENCOUNTER — Encounter: Payer: Self-pay | Admitting: Cardiology

## 2023-07-27 ENCOUNTER — Ambulatory Visit: Payer: Medicare HMO | Admitting: Cardiology

## 2023-07-30 ENCOUNTER — Ambulatory Visit: Payer: Medicare HMO | Admitting: Cardiology

## 2023-07-31 ENCOUNTER — Ambulatory Visit: Payer: Medicare HMO | Admitting: Cardiology

## 2023-07-31 ENCOUNTER — Encounter: Payer: Self-pay | Admitting: Cardiology

## 2023-07-31 ENCOUNTER — Ambulatory Visit: Admitting: Cardiology

## 2023-07-31 ENCOUNTER — Ambulatory Visit: Attending: Cardiology | Admitting: Cardiology

## 2023-07-31 VITALS — BP 112/70 | HR 74 | Ht 64.0 in | Wt 176.0 lb

## 2023-07-31 DIAGNOSIS — I480 Paroxysmal atrial fibrillation: Secondary | ICD-10-CM | POA: Diagnosis not present

## 2023-07-31 DIAGNOSIS — D6869 Other thrombophilia: Secondary | ICD-10-CM

## 2023-07-31 MED ORDER — RIVAROXABAN 20 MG PO TABS: 20.0000 mg | ORAL_TABLET | Freq: Every day | ORAL | 11 refills | Status: DC

## 2023-07-31 MED ORDER — METOPROLOL TARTRATE 25 MG PO TABS: 25.0000 mg | ORAL_TABLET | Freq: Every day | ORAL | 3 refills | Status: DC | PRN

## 2023-07-31 NOTE — Progress Notes (Signed)
 Electrophysiology Office Note:   Date:  07/31/2023  ID:  ADELLYN CAPEK, DOB Apr 13, 1951, MRN [id number]  Primary Cardiologist: None Primary Heart Failure: None Electrophysiologist: Nobie Putnam, MD      History of Present Illness:   Shirley Lowe is a 73 y.o. 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  Discussed the use of AI scribe software for clinical note transcription with the patient, who gave verbal consent to proceed.  During our last visit, patient reported that she has had intermittent episodes of palpitations for the past few years that were increasing in frequency, now occurring weekly and lasting up to hours in duration. She had complained of palpitations to her primary care provider and a Zio monitor was ordered.  Monitor revealed an episode of atrial fibrillation lasting 3 hours, during which she was symptomatic.  We discussed rhythm control options such as ablation or antiarrhythmic drug therapy.  Patient underwent coronary evaluation in preparation of potentially starting class Ic agent.  Coronary CTA was normal.  Since her last visit, she experiences minor episodes of atrial fibrillation, describing them as 'little ones' with a sensation of fluttering. She is taking Eliquis to prevent stroke due to atrial fibrillation but stopped it on Sunday due to muscle pain in her right arm and leg, as well as stomach issues. These symptoms resolved after discontinuing the medication. She has not experienced any bruising while on Eliquis.S he inquires about the use of flecainide, mainly wanting to discuss potential side effects she is otherwise well with no new or acute complaints.   Review of systems complete and found to be negative unless listed in HPI.   EP Information / Studies Reviewed:    EKG is not ordered today.  EKG from 04/23/23 reviewed which showed sinus rhythm with PR and QRS 74ms.      Zio Monitor 04/2023  HR 46 - 193, average 68 bpm. 56 nonsustained SVT, longest 10.2 seconds with an average rate of 135 bpm. <1% burden of atrial fibrillation, average rate 122 bpm. 1 AF episode lasting 3 hours. Rare supraventricular and ventricular ectopy. Symptom trigger episodes correspond to atrial fibrillation.  Risk Assessment/Calculations:    CHA2DS2-VASc Score = 2   This indicates a 2.2% annual risk of stroke. The patient's score is based upon: CHF History: 0 HTN History: 0 Diabetes History: 0 Stroke History: 0 Vascular Disease History: 0 Age Score: 1 Gender Score: 1              Physical Exam:   VS:  BP 112/70   Pulse 74   Ht 5\' 4"  (1.626 m)   Wt 176 lb (79.8 kg)   SpO2 96%   BMI 30.21 kg/m    Wt Readings from Last 3 Encounters:  07/31/23 176 lb (79.8 kg)  04/23/23 165 lb 6.4 oz (75 kg)  03/20/23 167 lb 3.2 oz (75.8 kg)     GEN: Well nourished, well developed in no acute distress NECK: No JVD CARDIAC: Normal rate and regular rhythm. RESPIRATORY:  Clear to auscultation without rales, wheezing or rhonchi  ABDOMEN: Soft, non-tender, non-distended EXTREMITIES:  No edema; No deformity   ASSESSMENT AND PLAN:   Shirley Lowe is a 73 y.o. 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  #Paroxsymal atrial fibrillation, symptomatic: Burden appears to be relatively low. -We once again discussed her options extensively.  Her daughter was also present.  Options include starting as needed metoprolol for sustained episodes, initiating antiarrhythmic drug therapy to prevent future episodes with flecainide 50 mg twice daily (normal coronary CTA and normal LVEF), or catheter ablation.  Risk and benefits of each option were discussed  extensively.  Patient voiced understanding and would like time to think about her options.  For now we will at minimum prescribe as needed metoprolol to have available.  Due to her low lasting heart rate we will not make this scheduled.  #Secondary hypercoagulable state due to atrial fibrillation:  -Her CHA2DS2-VASc score is 2 which is borderline for indication.  We discussed the risks and benefits of anticoagulation and patient would like to continue with stroke prophylaxis for now.  Due to Eliquis intolerance, we will change to Xarelto 20 mg once daily.  Follow up with EP APP  in 3 months.   Signed, Nobie Putnam, MD

## 2023-07-31 NOTE — Patient Instructions (Signed)
 Medication Instructions:  Your physician has recommended you make the following change in your medication:  1) START taking Lopressor (metoprolol tartrate) 25 mg daily as needed 2) STOP taking Eliquis 3) START taking Xarelto 20 mg daily   *If you need a refill on your cardiac medications before your next appointment, please call your pharmacy*  Follow-Up: At [facility], you and your health needs are our priority.  As part of our continuing mission to provide you with exceptional heart care, our providers are all part of one team.  This team includes your primary Cardiologist (physician) and Advanced Practice Providers or APPs (Physician Assistants and Nurse Practitioners) who all work together to provide you with the care you need, when you need it.  Your next appointment:   6 months  Provider:   Sherie Don, NP

## 2023-08-01 ENCOUNTER — Other Ambulatory Visit: Payer: Self-pay | Admitting: Cardiology

## 2023-08-15 ENCOUNTER — Ambulatory Visit: Payer: Medicare HMO | Attending: Internal Medicine | Admitting: Internal Medicine

## 2023-08-15 ENCOUNTER — Encounter: Payer: Self-pay | Admitting: Internal Medicine

## 2023-08-15 VITALS — BP 114/75 | HR 57 | Ht 64.0 in | Wt 168.2 lb

## 2023-08-15 DIAGNOSIS — R011 Cardiac murmur, unspecified: Secondary | ICD-10-CM | POA: Insufficient documentation

## 2023-08-15 DIAGNOSIS — R079 Chest pain, unspecified: Secondary | ICD-10-CM | POA: Diagnosis not present

## 2023-08-15 DIAGNOSIS — I480 Paroxysmal atrial fibrillation: Secondary | ICD-10-CM | POA: Diagnosis not present

## 2023-08-15 DIAGNOSIS — R0609 Other forms of dyspnea: Secondary | ICD-10-CM | POA: Insufficient documentation

## 2023-08-15 NOTE — Progress Notes (Signed)
 Cardiology Office Note:  .   Date:  08/15/2023  ID:  Shirley Lowe, DOB 1950/06/10, MRN [id number] PCP: Aileen Alexanders, NP  [facility] Providers Cardiologist:  New  Electrophysiologist:  Ardeen Kohler, MD     History of Present Illness: .   Shirley Lowe is a 73 y.o. female with history of paroxysmal atrial fibrillation, breast cancer, ulcerative colitis, and osteoporosis, who presents for evaluation of chest pain and shortness of breath.  She has been seen twice by Dr. Daneil Dunker in our ET group for management of PAF, most recently 2 weeks ago.  Treatment options were discussed at length with decision made to use as needed furosemide.  She was switched from apixaban to rivaroxaban due to medication intolerance.  Today, Shirley Lowe reports that she has been feeling fairly well.  Her last episode that she attributes to atrial fibrillation occurred in December.  During these events, she develops a very painful sensation in the center of her chest which radiates up to her neck, jaw, and eventually the top of her head.  This is often associated with palpitations and happens most often when she is resting.  She notes some chronic exertional dyspnea, usually when going up stairs.  This has been present for at least a few years but seems to be getting a little bit worse.  She has not had any exertional chest pain.  She has chronic lower extremity edema and was previously told that this is due to lymphedema.  Shirley Lowe is tolerating rivaroxaban well.  While on apixaban, she had some weakness and fatigue in her arms that has since resolved.  She denies bleeding.  She notes that her ulcerative colitis has been in remission for years.  ROS: See HPI  Studies Reviewed: Aaron Aas   EKG Interpretation Date/Time:  Wednesday August 15 2023 09:58:59 EDT Ventricular Rate:  57 PR Interval:  148 QRS Duration:  72 QT Interval:  404 QTC Calculation: 393 R Axis:   9  Text Interpretation: Sinus  bradycardia Nonspecific T wave abnormality Abnormal ECG When compared with ECG of 23-Apr-2023 14:17, HEART RATE has decreased Otherwise no significant change Confirmed by Annaleah Arata, Veryl Gottron [phone]) on 08/15/2023 10:02:48 AM    Coronary CTA (06/28/2023): Normal study without evidence of coronary artery calcium or stenosis.  No significant extracardiac findings in the visualized chest.  Exercise stress echocardiogram (06/14/2023): Inconclusive study due to poor acoustic windows.  LVEF 60%.  14-day event monitor (03/21/2023): Predominantly sinus rhythm with paroxysmal atrial fibrillation (less than 1% burden) and multiple episodes of PSVT lasting up to 10 seconds.  Risk Assessment/Calculations:    CHA2DS2-VASc Score = 2   This indicates a 2.2% annual risk of stroke. The patient's score is based upon: CHF History: 0 HTN History: 0 Diabetes History: 0 Stroke History: 0 Vascular Disease History: 0 Age Score: 1 Gender Score: 1            Physical Exam:   VS:  BP 114/75   Pulse (!) 57   Ht 5\' 4"  (1.626 m)   Wt 168 lb 3.2 oz (76.3 kg)   SpO2 97%   BMI 28.87 kg/m    Wt Readings from Last 3 Encounters:  08/15/23 168 lb 3.2 oz (76.3 kg)  07/31/23 176 lb (79.8 kg)  04/23/23 165 lb 6.4 oz (75 kg)    General:  NAD. Neck: No JVD or HJR. Lungs: Clear to auscultation bilaterally without wheezes or crackles. Heart: Regular rate and rhythm with 2/6 systolic  murmur. Abdomen: Soft, nontender, nondistended. Extremities: No lower extremity edema.  ASSESSMENT AND PLAN: .    Paroxysmal atrial fibrillation: Last episode of chest pain and palpitations was in December.  Shirley Lowe previously consulted with Dr. Daneil Dunker with decision made to continue with as needed metoprolol.  Given infrequent episodes of atrial fibrillation, we have agreed to continue with this.  Shirley Lowe will remain on rivaroxaban 20 mg daily in the setting of her CHA2DS2-VASc score of at least 2.  Chest pain and dyspnea on  exertion: Chest pain predominantly accompanies palpitations and is likely related to her atrial fibrillation.  Preceding exercise stress echo was nondiagnostic, with subsequent coronary CTA being normal without any significant CAD.  Given progressive exertional dyspnea over the last few years and systolic murmur on examination today, we will attempt a formal transthoracic echocardiogram to exclude significant valvular heart disease or cardiomyopathy.  If this does not reveal any significant abnormalities, I think additional cardiac testing can be deferred.  Heart murmur: 2/6 systolic murmur appreciated on exam today.  We will attempt transthoracic echo, though images were suboptimal on recent exercise stress echo.    Dispo: Follow-up with Suzann Riddle, NP, in the EP clinic as planned in 01/2024.  She can return to see me on an as-needed basis unless echocardiogram shows significant abnormalities that warrant further attention.  Signed, Sammy Crisp, MD

## 2023-08-15 NOTE — Patient Instructions (Signed)
 Medication Instructions:   Your Physician recommend you continue on your current medication as directed.     *If you need a refill on your cardiac medications before your next appointment, please call your pharmacy*  Lab Work: None ordered today. If you have labs (blood work) drawn today and your tests are completely normal, you will receive your results only by: MyChart Message (if you have MyChart) OR A paper copy in the mail If you have any lab test that is abnormal or we need to change your treatment, we will call you to review the results.  Testing/Procedures: Your physician has requested that you have an echocardiogram. Echocardiography is a painless test that uses sound waves to create images of your heart. It provides your doctor with information about the size and shape of your heart and how well your heart's chambers and valves are working.   You may receive an ultrasound enhancing agent through an IV if needed to better visualize your heart during the echo. This procedure takes approximately one hour.  There are no restrictions for this procedure.  This will take place at [address] [facility] [address] [facility]) [address], [place] [address]  Please note: We ask at that you not bring children with you during ultrasound (echo/ vascular) testing. Due to room size and safety concerns, children are not allowed in the ultrasound rooms during exams. Our front office staff cannot provide observation of children in our lobby area while testing is being conducted. An adult accompanying a patient to their appointment will only be allowed in the ultrasound room at the discretion of the ultrasound technician under special circumstances. We apologize for any inconvenience.   Follow-Up: At [facility], you and your health needs are our priority.  As part of our continuing mission to provide you with exceptional heart care, our providers are all part of one team.  This team includes your  primary Cardiologist (physician) and Advanced Practice Providers or APPs (Physician Assistants and Nurse Practitioners) who all work together to provide you with the care you need, when you need it.  Your next appointment:   Suzann Riddle, NP on January 31, 2024 at 10:30 AM  Follow up with Dr. Nolan Battle as needed.   We recommend signing up for the patient portal called "MyChart".  Sign up information is provided on this After Visit Summary.  MyChart is used to connect with patients for Virtual Visits (Telemedicine).  Patients are able to view lab/test results, encounter notes, upcoming appointments, etc.  Non-urgent messages can be sent to your provider as well.   To learn more about what you can do with MyChart, go to [email or web address].

## 2023-08-16 ENCOUNTER — Encounter: Payer: Self-pay | Admitting: Emergency Medicine

## 2023-08-20 ENCOUNTER — Ambulatory Visit: Payer: Medicare HMO | Admitting: Dermatology

## 2023-08-20 DIAGNOSIS — L578 Other skin changes due to chronic exposure to nonionizing radiation: Secondary | ICD-10-CM | POA: Diagnosis not present

## 2023-08-20 DIAGNOSIS — I872 Venous insufficiency (chronic) (peripheral): Secondary | ICD-10-CM

## 2023-08-20 DIAGNOSIS — M793 Panniculitis, unspecified: Secondary | ICD-10-CM

## 2023-08-20 DIAGNOSIS — I8311 Varicose veins of right lower extremity with inflammation: Secondary | ICD-10-CM | POA: Diagnosis not present

## 2023-08-20 DIAGNOSIS — D1801 Hemangioma of skin and subcutaneous tissue: Secondary | ICD-10-CM

## 2023-08-20 DIAGNOSIS — L821 Other seborrheic keratosis: Secondary | ICD-10-CM

## 2023-08-20 DIAGNOSIS — I8312 Varicose veins of left lower extremity with inflammation: Secondary | ICD-10-CM | POA: Diagnosis not present

## 2023-08-20 DIAGNOSIS — L820 Inflamed seborrheic keratosis: Secondary | ICD-10-CM

## 2023-08-20 DIAGNOSIS — Z7189 Other specified counseling: Secondary | ICD-10-CM

## 2023-08-20 DIAGNOSIS — Z1283 Encounter for screening for malignant neoplasm of skin: Secondary | ICD-10-CM

## 2023-08-20 DIAGNOSIS — D229 Melanocytic nevi, unspecified: Secondary | ICD-10-CM

## 2023-08-20 DIAGNOSIS — I83813 Varicose veins of bilateral lower extremities with pain: Secondary | ICD-10-CM

## 2023-08-20 DIAGNOSIS — W908XXA Exposure to other nonionizing radiation, initial encounter: Secondary | ICD-10-CM

## 2023-08-20 DIAGNOSIS — L814 Other melanin hyperpigmentation: Secondary | ICD-10-CM

## 2023-08-20 MED ORDER — TACROLIMUS 0.1 % EX OINT: TOPICAL_OINTMENT | CUTANEOUS | 3 refills | Status: AC

## 2023-08-20 NOTE — Progress Notes (Signed)
 New Patient Visit   Subjective  Shirley Lowe is a 73 y.o. female who presents for the following: Skin Cancer Screening and Full Body Skin Exam  The patient presents for Total-Body Skin Exam (TBSE) for skin cancer screening and mole check. The patient has spots, moles and lesions to be evaluated, some may be new or changing. She has swelling, pain, discoloration, and some itching to the lower legs, present for years, but worsening. She has a prescription for TMC 0.025% cream and was using once a day, but she stopped since it was not improving. She has a spot on the right nose that was treated with cryotherapy about a year ago that has come back, mild scale at times. She has a spot on her left lower leg, present for years, but has grown. She also has a growth on her chest that is irritating and she would like removed. No history of skin cancer.    The following portions of the chart were reviewed this encounter and updated as appropriate: medications, allergies, medical history  Review of Systems:  No other skin or systemic complaints except as noted in HPI or Assessment and Plan.  Objective  Well appearing patient in no apparent distress; mood and affect are within normal limits.  A full examination was performed including scalp, head, eyes, ears, nose, lips, neck, chest, axillae, abdomen, back, buttocks, bilateral upper extremities, bilateral lower extremities, hands, feet, fingers, toes, fingernails, and toenails. All findings within normal limits unless otherwise noted below.   Relevant physical exam findings are noted in the Assessment and Plan.  R chest x 2, R lower pretibia x 1, L lat calf x 1, L popliteal x 1 Erythematous stuck-on, waxy papule or plaque  Right upper nasal dorsum - SK  Assessment & Plan   SKIN CANCER SCREENING PERFORMED TODAY.  ACTINIC DAMAGE - Chronic condition, secondary to cumulative UV/sun exposure - diffuse scaly erythematous macules with underlying  dyspigmentation - Recommend daily broad spectrum sunscreen SPF 30+ to sun-exposed areas, reapply every 2 hours as needed.  - Staying in the shade or wearing long sleeves, sun glasses (UVA+UVB protection) and wide brim hats (4-inch brim around the entire circumference of the hat) are also recommended for sun protection.  - Call for new or changing lesions.  LENTIGINES, SEBORRHEIC KERATOSES, HEMANGIOMAS - Benign normal skin lesions - Benign-appearing - Call for any changes  SEBORRHEIC KERATOSIS - Indistinct, waxy, pink-tan macule at right upper nasal dorsum, photo today.  Not bothersome to pt. - Benign-appearing - Discussed benign etiology and prognosis. - Observe - Call for any changes  MELANOCYTIC NEVI - Tan-brown and/or pink-flesh-colored symmetric macules and papules - Benign appearing on exam today - Observation - Call clinic for new or changing moles - Recommend daily use of broad spectrum spf 30+ sunscreen to sun-exposed areas.    LIPODERMATOSCLEROSIS WITH STASIS DERMATITIS AND VARICOSITIES Exam: Left medial upper ankle with violaceous and erythematous indurated plaques extending circumferentially, tender to touch; right ankle with yellow brown hyperpigmented patches with mild erythema; associated varicosities at calves, ankles and feet.  Counseling: Lipodermatosclerosis is a chronic inflammatory condition of unknown cause of the subcutaneous fat causing tenderness, discoloration and hardening of the involved skin, most commonly on the lower legs.  It may progress and gradually worsen over time, especially in the setting of chronic leg swelling. Daily compression stockings/hose is recommended.  Topical and intralesional steroids; Therapeutic Ultrasound and some systemic medications may be helpful.  Consultation with Vascular specialists may  yield other options.  Treatment:  Recommend daily compression hose Recommend Ibuprofen 400 mg PO q 4-6 hr prn pain, if approved by  cardiologist Start tacrolimus  0.1% ointment Apply to aa rash twice daily as needed dsp 60 g 2Rf. Restart Clobetasol  cream twice daily up to 2 weeks prn flares. Avoid applying to face, groin, and axilla. Use as directed. Long-term use can cause thinning of the skin. Topical steroids (such as triamcinolone, fluocinolone, fluocinonide, mometasone, clobetasol , halobetasol, betamethasone, hydrocortisone) can cause thinning and lightening of the skin if they are used for too long in the same area. Your physician has selected the right strength medicine for your problem and area affected on the body. Please use your medication only as directed by your physician to prevent side effects.   Will send referral to Vascular Surgeon. VARICOSE VEINS OF BOTH LOWER EXTREMITIES WITH PAIN   Related Procedures Ambulatory referral to Vascular Surgery INFLAMED SEBORRHEIC KERATOSIS R chest x 2, R lower pretibia x 1, L lat calf x 1, L popliteal x 1 Symptomatic, irritating, patient would like treated. Destruction of lesion - R chest x 2, R lower pretibia x 1, L lat calf x 1, L popliteal x 1  Destruction method: cryotherapy   Informed consent: discussed and consent obtained   Lesion destroyed using liquid nitrogen: Yes   Region frozen until ice ball extended beyond lesion: Yes   Outcome: patient tolerated procedure well with no complications   Post-procedure details: wound care instructions given   Additional details:  Prior to procedure, discussed risks of blister formation, small wound, skin dyspigmentation, or rare scar following cryotherapy. Recommend Vaseline ointment to treated areas while healing.  STASIS DERMATITIS   Related Medications tacrolimus  (PROTOPIC ) 0.1 % ointment Apply to affected area rash on leg twice daily as needed.  Return in about 1 year (around 08/19/2024) for TBSE.  IBernardine Bridegroom, CMA, am acting as scribe for Artemio Larry, MD .   Documentation: I have reviewed the above  documentation for accuracy and completeness, and I agree with the above.  Artemio Larry, MD

## 2023-08-20 NOTE — Patient Instructions (Addendum)
 Counseling: Lipodermatosclerosis is a chronic inflammatory condition of unknown cause of the subcutaneous fat causing tenderness, discoloration and hardening of the involved skin, most commonly on the lower legs.  It may progress and gradually worsen over time, especially in the setting of chronic leg swelling. Daily compression stockings/hose is recommended.  Topical and intralesional steroids; Therapeutic Ultrasound and some systemic medications may be helpful.  Consultation with Vascular specialists may yield other options.   Cryotherapy Aftercare  Wash gently with soap and water  everyday.   Apply Vaseline and Band-Aid daily until healed.    Seborrheic Keratosis  What causes seborrheic keratoses? Seborrheic keratoses are harmless, common skin growths that first appear during adult life.  As time goes by, more growths appear.  Some people may develop a large number of them.  Seborrheic keratoses appear on both covered and uncovered body parts.  They are not caused by sunlight.  The tendency to develop seborrheic keratoses can be inherited.  They vary in color from skin-colored to gray, brown, or even black.  They can be either smooth or have a rough, warty surface.   Seborrheic keratoses are superficial and look as if they were stuck on the skin.  Under the microscope this type of keratosis looks like layers upon layers of skin.  That is why at times the top layer may seem to fall off, but the rest of the growth remains and re-grows.    Treatment Seborrheic keratoses do not need to be treated, but can easily be removed in the office.  Seborrheic keratoses often cause symptoms when they rub on clothing or jewelry.  Lesions can be in the way of shaving.  If they become inflamed, they can cause itching, soreness, or burning.  Removal of a seborrheic keratosis can be accomplished by freezing, burning, or surgery. If any spot bleeds, scabs, or grows rapidly, please return to have it checked, as these  can be an indication of a skin cancer.    Due to recent changes in healthcare laws, you may see results of your pathology and/or laboratory studies on MyChart before the doctors have had a chance to review them. We understand that in some cases there may be results that are confusing or concerning to you. Please understand that not all results are received at the same time and often the doctors may need to interpret multiple results in order to provide you with the best plan of care or course of treatment. Therefore, we ask that you please give us  2 business days to thoroughly review all your results before contacting the office for clarification. Should we see a critical lab result, you will be contacted sooner.   If You Need Anything After Your Visit  If you have any questions or concerns for your doctor, please call our main line at [phone] and press option 4 to reach your doctor's medical assistant. If no one answers, please leave a voicemail as directed and we will return your call as soon as possible. Messages left after 4 pm will be answered the following business day.   You may also send us  a message via MyChart. We typically respond to MyChart messages within 1-2 business days.  For prescription refills, please ask your pharmacy to contact our office. Our fax number is [phone].  If you have an urgent issue when the clinic is closed that cannot wait until the next business day, you can page your doctor at the number below.    Please note that while  we do our best to be available for urgent issues outside of office hours, we are not available 24/7.   If you have an urgent issue and are unable to reach us , you may choose to seek medical care at your doctor's office, retail clinic, urgent care center, or emergency room.  If you have a medical emergency, please immediately call 911 or go to the emergency department.  Pager Numbers  - Dr. Bary Likes: [phone]  - Dr. Annette Barters:  [phone]  - Dr. Felipe Horton: [phone]   In the event of inclement weather, please call our main line at [phone] for an update on the status of any delays or closures.  Dermatology Medication Tips: Please keep the boxes that topical medications come in in order to help keep track of the instructions about where and how to use these. Pharmacies typically print the medication instructions only on the boxes and not directly on the medication tubes.   If your medication is too expensive, please contact our office at [phone] option 4 or send us  a message through MyChart.   We are unable to tell what your co-pay for medications will be in advance as this is different depending on your insurance coverage. However, we may be able to find a substitute medication at lower cost or fill out paperwork to get insurance to cover a needed medication.   If a prior authorization is required to get your medication covered by your insurance company, please allow us  1-2 business days to complete this process.  Drug prices often vary depending on where the prescription is filled and some pharmacies may offer cheaper prices.  The website www.goodrx.com contains coupons for medications through different pharmacies. The prices here do not account for what the cost may be with help from insurance (it may be cheaper with your insurance), but the website can give you the price if you did not use any insurance.  - You can print the associated coupon and take it with your prescription to the pharmacy.  - You may also stop by our office during regular business hours and pick up a GoodRx coupon card.  - If you need your prescription sent electronically to a different pharmacy, notify our office through [facility] or by phone at [phone] option 4.     Si Usted Necesita Algo Despus de Su Visita  Tambin puede enviarnos un mensaje a travs de Clinical cytogeneticist. Por lo general respondemos a los mensajes de  MyChart en el transcurso de 1 a 2 das hbiles.  Para renovar recetas, por favor pida a su farmacia que se ponga en contacto con nuestra oficina. Franz Jacks de fax es [place] [phone].  Si tiene un asunto urgente cuando la clnica est cerrada y que no puede esperar hasta el siguiente da hbil, puede llamar/localizar a su doctor(a) al nmero que aparece a continuacin.   Por favor, tenga en cuenta que aunque hacemos todo lo posible para estar disponibles para asuntos urgentes fuera del horario de [place], no estamos disponibles las 24 horas del da, los 7 [address]  [address] de la [place].   Si tiene un problema urgente y no puede comunicarse con nosotros, puede optar por buscar atencin mdica  en el consultorio de su doctor(a), en una clnica privada, en un centro de atencin urgente o en una sala de emergencias.  Si tiene Engineer, drilling, por favor llame inmediatamente al 911 o vaya a la sala de emergencias.  Nmeros de bper  - Dr. Bary Likes: [phone]  -  Lindell Revel: [phone]  - Dr. Felipe Horton: [phone]   En caso de inclemencias del tiempo, por favor llame a Lajuan Pila principal al [phone] para una actualizacin sobre el [place] de cualquier retraso o cierre.  Consejos para la medicacin en dermatologa: Por favor, guarde las cajas en las que vienen los medicamentos de uso tpico para ayudarle a seguir las instrucciones sobre dnde y cmo usarlos. Las farmacias generalmente imprimen las instrucciones del medicamento slo en las cajas y no directamente en los tubos del [place].   Si su medicamento es muy caro, por favor, pngase en contacto con Bettyjane Brunet llamando al [phone] y presione la opcin 4 o envenos un mensaje a travs de Clinical cytogeneticist.   No podemos decirle cul ser su copago por los medicamentos por adelantado ya que esto es diferente dependiendo de la cobertura de su seguro. Sin embargo, es posible que podamos encontrar un medicamento sustituto a Advice worker un formulario para que el seguro cubra el medicamento que se considera necesario.   Si se requiere una autorizacin previa para que su compaa de seguros [place] su medicamento, por favor permtanos de 1 a 2 das hbiles para completar este proceso.  Los precios de los medicamentos varan con frecuencia dependiendo del Environmental consultant de dnde se surte la receta y alguna farmacias pueden ofrecer precios ms baratos.  El sitio web www.goodrx.com tiene cupones para medicamentos de Health and safety inspector. Los precios aqu no tienen en cuenta lo que podra costar con la ayuda del seguro (puede ser ms barato con su seguro), pero el sitio web puede darle el precio si no utiliz Tourist information centre manager.  - Puede imprimir el cupn correspondiente y llevarlo con su receta a la farmacia.  - Tambin puede pasar por nuestra oficina durante el horario de atencin regular y Education officer, museum una tarjeta de cupones de GoodRx.  - Si necesita que su receta se enve electrnicamente a una farmacia diferente, informe a nuestra oficina a travs de MyChart de [place] o por telfono llamando al [phone] y presione la opcin 4.

## 2023-08-23 ENCOUNTER — Ambulatory Visit

## 2023-08-23 VITALS — Ht 64.0 in | Wt 165.0 lb

## 2023-08-23 DIAGNOSIS — Z0000 Encounter for general adult medical examination without abnormal findings: Secondary | ICD-10-CM | POA: Diagnosis not present

## 2023-08-23 DIAGNOSIS — Z780 Asymptomatic menopausal state: Secondary | ICD-10-CM

## 2023-08-23 NOTE — Patient Instructions (Addendum)
 Shirley Lowe , Thank you for taking time to come for your Medicare Wellness Visit. I appreciate your ongoing commitment to your health goals. Please review the following plan we discussed and let me know if I can assist you in the future.   Referrals/Orders/Follow-Ups/Clinician Recommendations: I have placed an order for a bone density test (due 10/21/23). Call [facility] @ [phone] to schedule.  This is a list of the screening recommended for you and due dates:  Health Maintenance  Topic Date Due   COVID-19 Vaccine (1) Never done   DTaP/Tdap/Td vaccine (1 - Tdap) Never done   Pneumonia Vaccine (1 of 2 - PCV) Never done   Zoster (Shingles) Vaccine (1 of 2) Never done   DEXA scan (bone density measurement)  10/21/2023   Flu Shot  11/30/2023   Mammogram  02/07/2024   Medicare Annual Wellness Visit  08/22/2024   Colon Cancer Screening  04/14/2030   Hepatitis C Screening  Completed   HPV Vaccine  Aged Out   Meningitis B Vaccine  Aged Out    Advanced directives: (Copy Requested) Please bring a copy of your health care power of attorney and living will to the office to be added to your chart at your convenience. You can mail to [facility] [address] [place], [place] [address] or email to [email or web address]  Next Medicare Annual Wellness Visit scheduled for next year: Yes, 09/04/24 @ 9:20am (phone visit)

## 2023-08-23 NOTE — Progress Notes (Signed)
 Subjective:   Shirley Lowe is a 73 y.o. who presents for a Medicare Wellness preventive visit.  Visit Complete: Virtual I connected with  Shirley Lowe on 08/23/23 by a audio enabled telemedicine application and verified that I am speaking with the correct person using two identifiers.  Patient Location: Home  Provider Location: Home Office  I discussed the limitations of evaluation and management by telemedicine. The patient expressed understanding and agreed to proceed.  Vital Signs: Because this visit was a virtual/telehealth visit, some criteria may be missing or patient reported. Any vitals not documented were not able to be obtained and vitals that have been documented are patient reported.  VideoDeclined- This patient declined Librarian, academic. Therefore the visit was completed with audio only.  Persons Participating in Visit: Patient.  AWV Questionnaire: Yes: Patient Medicare AWV questionnaire was completed by the patient on 08/22/23; I have confirmed that all information answered by patient is correct and no changes since this date.  Cardiac Risk Factors include: advanced age (>47men, >43 women);dyslipidemia     Objective:    Today's Vitals   08/23/23 1121  Weight: 165 lb (74.8 kg)  Height: 5\' 4"  (1.626 m)   Body mass index is 28.32 kg/m.     08/23/2023   11:30 AM 03/18/2023    1:37 AM 07/03/2022    2:02 PM 09/13/2021    7:39 AM 05/05/2021   10:27 AM 09/22/2020    5:00 PM 09/13/2020   11:39 AM  Advanced Directives  Does Patient Have a Medical Advance Directive? Yes No No Yes No Yes Yes  Type of Estate agent of [place];Living will   Healthcare Power of [place];Living will  Healthcare Power of [place];Living will Healthcare Power of [place];Living will  Does patient want to make changes to medical advance directive? No - Patient declined   No - Patient declined  No - Patient declined   Copy of Healthcare  Power of Attorney in Chart? No - copy requested   No - copy requested     Would patient like information on creating a medical advance directive?  No - Patient declined No - Patient declined  No - Patient declined      Current Medications (verified) Outpatient Encounter Medications as of 08/23/2023  Medication Sig   acetaminophen  (TYLENOL ) 500 MG tablet Take 2 tablets (1,000 mg total) by mouth every 6 (six) hours.   ascorbic acid (VITAMIN C) 1000 MG tablet Take 1,000 mg by mouth daily.   B Complex Vitamins (VITAMIN B-COMPLEX) TABS Take 1 tablet by mouth daily.   Calcium Carbonate (CALTRATE 600 PO) Take 1 tablet by mouth daily.   CITRUS BERGAMOT PO Take 1 tablet by mouth daily.   [employer] CRANBERRY 500 MG CAPS    Garlic 500 MG TABS Take 1 tablet by mouth daily.   Green Tea, Camellia sinensis, (GREEN TEA EXTRACT PO) Take 1 tablet by mouth daily.   Magnesium 125 MG CAPS Take 125 mg by mouth daily.   mesalamine  (LIALDA ) 1.2 g EC tablet TAKE 1 TABLET BY MOUTH IN THE MORNING AND IN THE EVENING   metoprolol  tartrate (LOPRESSOR ) 25 MG tablet TAKE 1 TABLET BY MOUTH DAILY AS NEEDED.   Misc Natural Products (ADV TURMERIC CURCUMIN COMPLEX PO) Take 1 capsule by mouth daily.   Multiple Vitamins-Minerals (SUPER D-ZINC-SELENIUM-COPPER PO) Take 1 tablet by mouth daily.   Omega-3 Fatty Acids (FISH OIL) 1000 MG CAPS Take 1,000 mg by mouth daily.  PUMPKIN SEED PO Take 1 tablet by mouth daily.   rivaroxaban  (XARELTO ) 20 MG TABS tablet Take 1 tablet (20 mg total) by mouth daily with supper.   tacrolimus  (PROTOPIC ) 0.1 % ointment Apply to affected area rash on leg twice daily as needed.   Taurine 1000 MG CAPS Take 1 capsule by mouth daily.   triamcinolone (KENALOG) 0.025 % cream Apply 1 Application topically daily.   VITAMIN D -VITAMIN K PO Take 1 tablet by mouth daily.   vitamin E 100 UNIT capsule Take 100 Units by mouth daily.   zinc gluconate 50 MG tablet Take 50 mg by mouth daily.   No facility-administered  encounter medications on file as of 08/23/2023.    Allergies (verified) Penicillins   History: Past Medical History:  Diagnosis Date   Arrhythmia [id number]   Breast cancer (HCC)    Left DCI breast cancer   Cancer (HCC)    Diverticulitis    Diverticulitis    Dyspnea    with exerton due to out of shape per pt    Osteoporosis    Ulcerative colitis    Past Surgical History:  Procedure Laterality Date   ABDOMINAL HYSTERECTOMY     BREAST BIOPSY Left 12/01/2021   Stereo bx, X-clip, path pending   CATARACT EXTRACTION W/PHACO Right 09/13/2021   Procedure: CATARACT EXTRACTION PHACO AND INTRAOCULAR LENS PLACEMENT (IOC) RIGHT RAYNOR LENS;  Surgeon: Clair Crews, MD;  Location: [facility];  Service: Ophthalmology;  Laterality: Right;  5.32 00:40.7   CATARACT EXTRACTION W/PHACO Left 09/27/2021   Procedure: CATARACT EXTRACTION PHACO AND INTRAOCULAR LENS PLACEMENT (IOC) LEFT RAYNOR LENS;  Surgeon: Clair Crews, MD;  Location: [facility];  Service: Ophthalmology;  Laterality: Left;  4.79 0:38.5   COLON RESECTION SIGMOID N/A    COLONOSCOPY WITH PROPOFOL  N/A 11/12/2019   Procedure: COLONOSCOPY WITH PROPOFOL ;  Surgeon: Irby Mannan, MD;  Location: [facility];  Service: Endoscopy;  Laterality: N/A;   INCONTINENCE SURGERY     TONSILLECTOMY     TONSILLECTOMY     Family History  Problem Relation Age of Onset   Cancer Mother        breast cancer   Breast cancer Mother 18       metastic   Cancer Father        pancreatic   Hepatitis C Sister    Thyroid  disease Sister    Irritable bowel syndrome Sister    Arrhythmia Sister    Allergies Sister    Social History   Socioeconomic History   Marital status: Married    Spouse name: Joanette Moynahan   Number of children: 3   Years of education: Not on file   Highest education level: Bachelor's degree (e.g., BA, AB, BS)  Occupational History   Occupation: Retired  Tobacco Use   Smoking status: Never    Passive  exposure: Past   Smokeless tobacco: Never   Tobacco comments:    smoking cessation materials not required  Vaping Use   Vaping status: Never Used  Substance and Sexual Activity   Alcohol use: No   Drug use: No   Sexual activity: Not Currently  Other Topics Concern   Not on file  Social History Narrative   Not on file   Social Drivers of Health   Financial Resource Strain: Low Risk  (08/23/2023)   Overall Financial Resource Strain (CARDIA)    Difficulty of Paying Living Expenses: Not very hard  Food Insecurity: No Food Insecurity (08/23/2023)   Hunger  Vital Sign    Worried About Programme researcher, broadcasting/film/video in the Last Year: Never true    Ran Out of Food in the Last Year: Never true  Transportation Needs: No Transportation Needs (08/23/2023)   PRAPARE - Administrator, Civil Service (Medical): No    Lack of Transportation (Non-Medical): No  Physical Activity: Inactive (08/23/2023)   Exercise Vital Sign    Days of Exercise per Week: 0 days    Minutes of Exercise per Session: 0 min  Stress: No Stress Concern Present (08/23/2023)   [employer] of Occupational Health - Occupational Stress Questionnaire    Feeling of Stress : Not at all  Social Connections: Socially Integrated (08/23/2023)   Social Connection and Isolation Panel [NHANES]    Frequency of Communication with Friends and Family: More than three times a week    Frequency of Social Gatherings with Friends and Family: Three times a week    Attends Religious Services: More than 4 times per year    Active Member of Clubs or Organizations: Yes    Attends Engineer, structural: More than 4 times per year    Marital Status: Married    Tobacco Counseling Counseling given: No Tobacco comments: smoking cessation materials not required    Clinical Intake:  Pre-visit preparation completed: Yes  Pain : No/denies pain     BMI - recorded: 28.32 Nutritional Status: BMI 25 -29 Overweight Nutritional Risks:  None Diabetes: No  No results found for: "HGBA1C"   How often do you need to have someone help you when you read instructions, pamphlets, or other written materials from your doctor or pharmacy?: 1 - Never  Interpreter Needed?: No  Information entered by :: Jaunita Messier, CMA   Activities of Daily Living     08/23/2023   11:23 AM 08/22/2023    3:07 PM  In your present state of health, do you have any difficulty performing the following activities:  Hearing? 0 0  Vision? 0 0  Difficulty concentrating or making decisions? 0 0  Walking or climbing stairs? 0 0  Dressing or bathing? 0 0  Doing errands, shopping? 0 0  Preparing Food and eating ? N N  Using the Toilet? N N  In the past six months, have you accidently leaked urine? Colie Dawes  Comment wears pad   Do you have problems with loss of bowel control? N N  Managing your Medications? N N  Managing your Finances? N N  Housekeeping or managing your Housekeeping? N N    Patient Care Team: Aileen Alexanders, NP as PCP - General (Nurse Practitioner) Ardeen Kohler, MD as PCP - Electrophysiology (Cardiology) Clair Crews, MD as Referring Physician (Ophthalmology) Selena Daily, MD as Consulting Physician (Gastroenterology) Artemio Larry, MD (Dermatology) Darryle Ends (Inactive) as Referring Physician (Oncology)  Indicate any recent Medical Services you may have received from other than [facility] providers in the past year (date may be approximate).     Assessment:   This is a routine wellness examination for [place].  Hearing/Vision screen Hearing Screening - Comments:: Denies hearing loss Vision Screening - Comments:: Gets eye exams, Dr. Merrell Abate, [place]   Goals Addressed               This Visit's Progress     Exercise 3x per week (30 min per time) (pt-stated)         Depression Screen     08/23/2023   11:28 AM 03/20/2023  5:20 PM 07/27/2022   11:01 AM 07/10/2022   10:22 AM 07/03/2022    2:00  PM 02/23/2022    9:22 AM 01/09/2022   11:27 AM  PHQ 2/9 Scores  PHQ - 2 Score 0 0 0 0 0 0 0  PHQ- 9 Score 1 1 0 0 0 2 0    Fall Risk     08/23/2023   11:31 AM 08/22/2023    3:07 PM 07/27/2022   11:01 AM 07/10/2022   10:22 AM 07/03/2022    2:03 PM  Fall Risk   Falls in the past year? 0 0 0 0 0  Number falls in past yr: 0   0 0  Injury with Fall? 0   0 0  Risk for fall due to : No Fall Risks  No Fall Risks No Fall Risks No Fall Risks  Follow up Falls prevention discussed;Falls evaluation completed  Falls evaluation completed Falls evaluation completed Falls prevention discussed;Falls evaluation completed    MEDICARE RISK AT HOME:  Medicare Risk at Home Any stairs in or around the home?: Yes If so, are there any without handrails?: No Home free of loose throw rugs in walkways, pet beds, electrical cords, etc?: Yes Adequate lighting in your home to reduce risk of falls?: Yes Life alert?: No Use of a cane, walker or w/c?: No Grab bars in the bathroom?: Yes Shower chair or bench in shower?: Yes Elevated toilet seat or a handicapped toilet?: Yes  TIMED UP AND GO:  Was the test performed?  No  Cognitive Function: 6CIT completed    05/05/2021   10:28 AM  MMSE - Mini Mental State Exam  Not completed: Unable to complete        08/23/2023   11:32 AM 07/03/2022    2:09 PM 05/05/2021   10:28 AM 09/19/2018   10:52 AM 09/13/2017    1:35 PM  6CIT Screen  What Year? 0 points 0 points 0 points 0 points 0 points  What month? 0 points 0 points 0 points 0 points 0 points  What time? 0 points 0 points 0 points 0 points 0 points  Count back from 20 0 points 0 points 0 points 0 points 0 points  Months in reverse 0 points 0 points 0 points 0 points 0 points  Repeat phrase 0 points 0 points 0 points 0 points 0 points  Total Score 0 points 0 points 0 points 0 points 0 points    Immunizations  There is no immunization history on file for this patient.  Screening Tests Health Maintenance   Topic Date Due   COVID-19 Vaccine (1) Never done   DTaP/Tdap/Td (1 - Tdap) Never done   Pneumonia Vaccine 108+ Years old (1 of 2 - PCV) Never done   Zoster Vaccines- Shingrix (1 of 2) Never done   DEXA SCAN  10/21/2023   INFLUENZA VACCINE  11/30/2023   MAMMOGRAM  02/07/2024   Medicare Annual Wellness (AWV)  08/22/2024   Colonoscopy  04/14/2030   Hepatitis C Screening  Completed   HPV VACCINES  Aged Out   Meningococcal B Vaccine  Aged Out    Health Maintenance  Health Maintenance Due  Topic Date Due   COVID-19 Vaccine (1) Never done   DTaP/Tdap/Td (1 - Tdap) Never done   Pneumonia Vaccine 83+ Years old (1 of 2 - PCV) Never done   Zoster Vaccines- Shingrix (1 of 2) Never done   Health Maintenance Items Addressed: DEXA ordered, See Nurse  Notes  Additional Screening:  Vision Screening: Recommended annual ophthalmology exams for early detection of glaucoma and other disorders of the eye.  Dental Screening: Recommended annual dental exams for proper oral hygiene  Community Resource Referral / Chronic Care Management: CRR required this visit?  No   CCM required this visit?  No     Plan:     I have personally reviewed and noted the following in the patient's chart:   Medical and social history Use of alcohol, tobacco or illicit drugs  Current medications and supplements including opioid prescriptions. Patient is not currently taking opioid prescriptions. Functional ability and status Nutritional status Physical activity Advanced directives List of other physicians Hospitalizations, surgeries, and ER visits in previous 12 months Vitals Screenings to include cognitive, depression, and falls Referrals and appointments  In addition, I have reviewed and discussed with patient certain preventive protocols, quality metrics, and best practice recommendations. A written personalized care plan for preventive services as well as general preventive health recommendations were  provided to patient.     Jaunita Messier, CMA   08/23/2023   After Visit Summary: (MyChart) Due to this being a telephonic visit, the after visit summary with patients personalized plan was offered to patient via MyChart   Notes:  Placed order for a DEXA scan (due ~10/21/23) [facility] followed by [facility] (due ~02/07/24) Declined all vaccines Made 6 mth follow up appointment for 09/12/23

## 2023-09-05 ENCOUNTER — Encounter (HOSPITAL_COMMUNITY): Payer: Self-pay

## 2023-09-12 ENCOUNTER — Telehealth: Payer: Self-pay

## 2023-09-12 ENCOUNTER — Encounter: Payer: Self-pay | Admitting: Nurse Practitioner

## 2023-09-12 ENCOUNTER — Ambulatory Visit (INDEPENDENT_AMBULATORY_CARE_PROVIDER_SITE_OTHER): Admitting: Nurse Practitioner

## 2023-09-12 VITALS — BP 123/63 | HR 56 | Temp 98.2°F | Resp 15 | Ht 64.02 in | Wt 166.2 lb

## 2023-09-12 DIAGNOSIS — I4811 Longstanding persistent atrial fibrillation: Secondary | ICD-10-CM | POA: Diagnosis not present

## 2023-09-12 DIAGNOSIS — D0512 Intraductal carcinoma in situ of left breast: Secondary | ICD-10-CM | POA: Diagnosis not present

## 2023-09-12 DIAGNOSIS — Z0000 Encounter for general adult medical examination without abnormal findings: Secondary | ICD-10-CM | POA: Diagnosis not present

## 2023-09-12 DIAGNOSIS — I4891 Unspecified atrial fibrillation: Secondary | ICD-10-CM | POA: Insufficient documentation

## 2023-09-12 DIAGNOSIS — E782 Mixed hyperlipidemia: Secondary | ICD-10-CM

## 2023-09-12 NOTE — Assessment & Plan Note (Signed)
 Chronic. Labs ordered today.  Reviewed ASCVD risk score with patient during visit today.  Patient declined statin therapy.  Will make further recommendations based on results.  The 10-year ASCVD risk score (Arnett DK, et al., 2019) is: 13.5%   Values used to calculate the score:     Age: 73 years     Sex: Female     Is Non-Hispanic African American: No     Diabetic: No     Tobacco smoker: No     Systolic Blood Pressure: 123 mmHg     Is BP treated: No     HDL Cholesterol: 75 mg/dL     Total Cholesterol: 212 mg/dL

## 2023-09-12 NOTE — Progress Notes (Signed)
 BP 123/63 (BP Location: Left Arm, Patient Position: Sitting, Cuff Size: Normal)   Pulse (!) 56   Temp 98.2 F (36.8 C) (Oral)   Resp 15   Ht 5' 4.02" (1.626 m)   Wt 166 lb 3.2 oz (75.4 kg)   SpO2 97%   BMI 28.51 kg/m    Subjective:    Patient ID: Shirley Lowe, female    DOB: 02/19/1951, 73 y.o.   MRN: [id number]  HPI: Shirley Lowe is a 73 y.o. female presenting on 09/12/2023 for comprehensive medical examination. Current medical complaints include:none  She currently lives with: Menopausal Symptoms: no  HYPERLIPIDEMIA Hyperlipidemia status: excellent compliance Satisfied with current treatment?  yes Side effects:  no Medication compliance: good compliance Past cholesterol meds: none Supplements: fish oil Aspirin:  no The 10-year ASCVD risk score (Arnett DK, et al., 2019) is: 13.5%   Values used to calculate the score:     Age: 55 years     Sex: Female     Is Non-Hispanic African American: No     Diabetic: No     Tobacco smoker: No     Systolic Blood Pressure: 123 mmHg     Is BP treated: No     HDL Cholesterol: 75 mg/dL     Total Cholesterol: 212 mg/dL Chest pain:  no Coronary artery disease:  no Family history CAD:  no Family history early CAD:  no   Depression Screen done today and results listed below:     09/12/2023   11:17 AM 08/23/2023   11:28 AM 03/20/2023    5:20 PM 07/27/2022   11:01 AM 07/10/2022   10:22 AM  Depression screen PHQ 2/9  Decreased Interest 0 0 0 0 0  Down, Depressed, Hopeless 0 0 0 0 0  PHQ - 2 Score 0 0 0 0 0  Altered sleeping 0 0 0 0 0  Tired, decreased energy 0 1 1 0 0  Change in appetite 0 0 0 0 0  Feeling bad or failure about yourself  0 0 0 0 0  Trouble concentrating 0 0 0 0 0  Moving slowly or fidgety/restless 0 0 0 0 0  Suicidal thoughts 0 0 0 0 0  PHQ-9 Score 0 1 1 0 0  Difficult doing work/chores   Somewhat difficult Not difficult at all Not difficult at all    The patient does not have a history of falls. I  did complete a risk assessment for falls. A plan of care for falls was documented.   Past Medical History:  Past Medical History:  Diagnosis Date   Arrhythmia [id number]   Breast cancer [facility])    Left DCI breast cancer   Cancer [facility])    Cataract 2019   Removed 2023   Diverticulitis    Diverticulitis    Dyspnea    with exerton due to out of shape per pt    Heart murmur 08/15/23   Dr End   Hyperlipidemia 2016   Osteoporosis    Ulcerative colitis     Surgical History:  Past Surgical History:  Procedure Laterality Date   ABDOMINAL HYSTERECTOMY     BREAST BIOPSY Left 12/01/2021   Stereo bx, X-clip, path pending   CATARACT EXTRACTION W/PHACO Right 09/13/2021   Procedure: CATARACT EXTRACTION PHACO AND INTRAOCULAR LENS PLACEMENT (IOC) RIGHT RAYNOR LENS;  Surgeon: Clair Crews, MD;  Location: [facility];  Service: Ophthalmology;  Laterality: Right;  5.32 00:40.7   CATARACT EXTRACTION W/PHACO  Left 09/27/2021   Procedure: CATARACT EXTRACTION PHACO AND INTRAOCULAR LENS PLACEMENT (IOC) LEFT RAYNOR LENS;  Surgeon: Clair Crews, MD;  Location: [facility];  Service: Ophthalmology;  Laterality: Left;  4.79 0:38.5   COLON RESECTION SIGMOID N/A    COLON SURGERY  09/22/20   4 1/2" Sigmoid colon removed   COLONOSCOPY WITH PROPOFOL  N/A 11/12/2019   Procedure: COLONOSCOPY WITH PROPOFOL ;  Surgeon: Irby Mannan, MD;  Location: [facility];  Service: Endoscopy;  Laterality: N/A;   EYE SURGERY  08/2021   Cataracts removed   INCONTINENCE SURGERY     TONSILLECTOMY     TONSILLECTOMY      Medications:  Current Outpatient Medications on File Prior to Visit  Medication Sig   acetaminophen  (TYLENOL ) 500 MG tablet Take 2 tablets (1,000 mg total) by mouth every 6 (six) hours.   ascorbic acid (VITAMIN C) 1000 MG tablet Take 1,000 mg by mouth daily.   B Complex Vitamins (VITAMIN B-COMPLEX) TABS Take 1 tablet by mouth daily.   Calcium Carbonate (CALTRATE 600 PO) Take  1 tablet by mouth daily.   CITRUS BERGAMOT PO Take 1 tablet by mouth daily.   [employer] CRANBERRY 500 MG CAPS    Garlic 500 MG TABS Take 1 tablet by mouth daily.   Green Tea, Camellia sinensis, (GREEN TEA EXTRACT PO) Take 1 tablet by mouth daily.   Magnesium 125 MG CAPS Take 125 mg by mouth daily.   mesalamine  (LIALDA ) 1.2 g EC tablet TAKE 1 TABLET BY MOUTH IN THE MORNING AND IN THE EVENING   metoprolol  tartrate (LOPRESSOR ) 25 MG tablet TAKE 1 TABLET BY MOUTH DAILY AS NEEDED.   Misc Natural Products (ADV TURMERIC CURCUMIN COMPLEX PO) Take 1 capsule by mouth daily.   Multiple Vitamins-Minerals (SUPER D-ZINC-SELENIUM-COPPER PO) Take 1 tablet by mouth daily.   Omega-3 Fatty Acids (FISH OIL) 1000 MG CAPS Take 1,000 mg by mouth daily.   PUMPKIN SEED PO Take 1 tablet by mouth daily.   tacrolimus  (PROTOPIC ) 0.1 % ointment Apply to affected area rash on leg twice daily as needed.   Taurine 1000 MG CAPS Take 1 capsule by mouth daily.   triamcinolone (KENALOG) 0.025 % cream Apply 1 Application topically daily.   VITAMIN D -VITAMIN K PO Take 1 tablet by mouth daily.   vitamin E 100 UNIT capsule Take 100 Units by mouth daily.   zinc gluconate 50 MG tablet Take 50 mg by mouth daily.   No current facility-administered medications on file prior to visit.    Allergies:  Allergies  Allergen Reactions   Penicillins     Has yeast infection    Social History:  Social History   Socioeconomic History   Marital status: Married    Spouse name: Joanette Moynahan   Number of children: 3   Years of education: Not on file   Highest education level: Bachelor's degree (e.g., BA, AB, BS)  Occupational History   Occupation: Retired  Tobacco Use   Smoking status: Never    Passive exposure: Past   Smokeless tobacco: Never   Tobacco comments:    smoking cessation materials not required  Vaping Use   Vaping status: Never Used  Substance and Sexual Activity   Alcohol use: Never   Drug use: Never   Sexual activity: Not  Currently    Birth control/protection: None  Other Topics Concern   Not on file  Social History Narrative   Not on file   Social Drivers of Corporate investment banker  Strain: Low Risk  (08/23/2023)   Overall Financial Resource Strain (CARDIA)    Difficulty of Paying Living Expenses: Not very hard  Food Insecurity: No Food Insecurity (08/23/2023)   Hunger Vital Sign    Worried About Running Out of Food in the Last Year: Never true    Ran Out of Food in the Last Year: Never true  Transportation Needs: No Transportation Needs (08/23/2023)   PRAPARE - Administrator, Civil Service (Medical): No    Lack of Transportation (Non-Medical): No  Physical Activity: Inactive (08/23/2023)   Exercise Vital Sign    Days of Exercise per Week: 0 days    Minutes of Exercise per Session: 0 min  Stress: No Stress Concern Present (08/23/2023)   [employer] of Occupational Health - Occupational Stress Questionnaire    Feeling of Stress : Not at all  Social Connections: Socially Integrated (08/23/2023)   Social Connection and Isolation Panel [NHANES]    Frequency of Communication with Friends and Family: More than three times a week    Frequency of Social Gatherings with Friends and Family: Three times a week    Attends Religious Services: More than 4 times per year    Active Member of Clubs or Organizations: Yes    Attends Banker Meetings: More than 4 times per year    Marital Status: Married  Catering manager Violence: Not At Risk (08/23/2023)   Humiliation, Afraid, Rape, and Kick questionnaire    Fear of Current or Ex-Partner: No    Emotionally Abused: No    Physically Abused: No    Sexually Abused: No   Social History   Tobacco Use  Smoking Status Never   Passive exposure: Past  Smokeless Tobacco Never  Tobacco Comments   smoking cessation materials not required   Social History   Substance and Sexual Activity  Alcohol Use Never    Family History:   Family History  Problem Relation Age of Onset   Cancer Mother        breast cancer   Breast cancer Mother 78       metastic   Varicose Veins Mother    Cancer Father        pancreatic   Hepatitis C Sister    Thyroid  disease Sister    Hearing loss Sister    Irritable bowel syndrome Sister    Arrhythmia Sister    Hearing loss Sister    Allergies Sister    Hearing loss Sister     Past medical history, surgical history, medications, allergies, family history and social history reviewed with patient today and changes made to appropriate areas of the chart.   Review of Systems  All other systems reviewed and are negative.  All other ROS negative except what is listed above and in the HPI.      Objective:     BP 123/63 (BP Location: Left Arm, Patient Position: Sitting, Cuff Size: Normal)   Pulse (!) 56   Temp 98.2 F (36.8 C) (Oral)   Resp 15   Ht 5' 4.02" (1.626 m)   Wt 166 lb 3.2 oz (75.4 kg)   SpO2 97%   BMI 28.51 kg/m   Wt Readings from Last 3 Encounters:  09/12/23 166 lb 3.2 oz (75.4 kg)  08/23/23 165 lb (74.8 kg)  08/15/23 168 lb 3.2 oz (76.3 kg)    Physical Exam Vitals and nursing note reviewed.  Constitutional:      General: She is awake.  She is not in acute distress.    Appearance: Normal appearance. She is well-developed. She is not ill-appearing.  HENT:     Head: Normocephalic and atraumatic.     Right Ear: Hearing, tympanic membrane, ear canal and external ear normal. No drainage.     Left Ear: Hearing, tympanic membrane, ear canal and external ear normal. No drainage.     Nose: Nose normal.     Right Sinus: No maxillary sinus tenderness or frontal sinus tenderness.     Left Sinus: No maxillary sinus tenderness or frontal sinus tenderness.     Mouth/Throat:     Mouth: Mucous membranes are moist.     Pharynx: Oropharynx is clear. Uvula midline. No pharyngeal swelling, oropharyngeal exudate or posterior oropharyngeal erythema.  Eyes:     General: Lids  are normal.        Right eye: No discharge.        Left eye: No discharge.     Extraocular Movements: Extraocular movements intact.     Conjunctiva/sclera: Conjunctivae normal.     Pupils: Pupils are equal, round, and reactive to light.     Visual Fields: Right eye visual fields normal and left eye visual fields normal.  Neck:     Thyroid : No thyromegaly.     Vascular: No carotid bruit.     Trachea: Trachea normal.  Cardiovascular:     Rate and Rhythm: Normal rate and regular rhythm.     Heart sounds: Normal heart sounds. No murmur heard.    No gallop.  Pulmonary:     Effort: Pulmonary effort is normal. No accessory muscle usage or respiratory distress.     Breath sounds: Normal breath sounds.  Chest:  Breasts:    Right: Normal.     Left: Normal.  Abdominal:     General: Bowel sounds are normal.     Palpations: Abdomen is soft. There is no hepatomegaly or splenomegaly.     Tenderness: There is no abdominal tenderness.  Musculoskeletal:        General: Normal range of motion.     Cervical back: Normal range of motion and neck supple.     Right lower leg: No edema.     Left lower leg: No edema.  Lymphadenopathy:     Head:     Right side of head: No submental, submandibular, tonsillar, preauricular or posterior auricular adenopathy.     Left side of head: No submental, submandibular, tonsillar, preauricular or posterior auricular adenopathy.     Cervical: No cervical adenopathy.     Upper Body:     Right upper body: No supraclavicular, axillary or pectoral adenopathy.     Left upper body: No supraclavicular, axillary or pectoral adenopathy.  Skin:    General: Skin is warm and dry.     Capillary Refill: Capillary refill takes less than 2 seconds.     Findings: No rash.  Neurological:     Mental Status: She is alert and oriented to person, place, and time.     Gait: Gait is intact.  Psychiatric:        Attention and Perception: Attention normal.        Mood and Affect: Mood  normal.        Speech: Speech normal.        Behavior: Behavior normal. Behavior is cooperative.        Thought Content: Thought content normal.        Judgment: Judgment normal.  Results for orders placed or performed in visit on 08/16/23  HM MAMMOGRAPHY   Collection Time: 02/07/23 12:00 AM  Result Value Ref Range   HM Mammogram 0-4 Bi-Rad 0-4 Bi-Rad, Self Reported Normal      Assessment & Plan:   Problem List Items Addressed This Visit       Cardiovascular and Mediastinum   A-fib (HCC)   Chronic.  Controlled.  Continue with current medication regimen of Metoprolol  25mg  PRN.  Hasn't had any AFIB symptoms since December.  Followed by Cardiology- recent note reviewed. Labs ordered today.  Return to clinic in 6 months for reevaluation.  Call sooner if concerns arise.          Other   Ductal carcinoma in situ (DCIS) of left breast   Followed by Oncology.        Hyperlipidemia   Chronic. Labs ordered today.  Reviewed ASCVD risk score with patient during visit today.  Patient declined statin therapy.  Will make further recommendations based on results.  The 10-year ASCVD risk score (Arnett DK, et al., 2019) is: 13.5%   Values used to calculate the score:     Age: 89 years     Sex: Female     Is Non-Hispanic African American: No     Diabetic: No     Tobacco smoker: No     Systolic Blood Pressure: 123 mmHg     Is BP treated: No     HDL Cholesterol: 75 mg/dL     Total Cholesterol: 212 mg/dL       Relevant Orders   Lipid panel   Other Visit Diagnoses       Annual physical exam    -  Primary   Health maintenance reviewed during visit today.  Labs ordered.   Relevant Orders   CBC with Differential/Platelet   Comprehensive metabolic panel with GFR   Lipid panel   TSH        Follow up plan: Return in about 1 year (around 09/11/2024) for Physical and Fasting labs.   LABORATORY TESTING:  - Pap smear: not applicable  IMMUNIZATIONS:   - Tdap: Tetanus  vaccination status reviewed: Not up to date. - Influenza: Postponed to flu season - Pneumovax: Refused - Prevnar: Refused - COVID: Not applicable - HPV: Not applicable - Shingrix vaccine: Discussed at visit today  SCREENING: -Mammogram: Up to date  - Colonoscopy: Up to date  - Bone Density:has the order- needs to call and schedule -Hearing Test: Not applicable  -Spirometry: Not applicable   PATIENT COUNSELING:   Advised to take 1 mg of folate supplement per day if capable of pregnancy.   Sexuality: Discussed sexually transmitted diseases, partner selection, use of condoms, avoidance of unintended pregnancy  and contraceptive alternatives.   Advised to avoid cigarette smoking.  I discussed with the patient that most people either abstain from alcohol or drink within safe limits (<=14/week and <=4 drinks/occasion for males, <=7/weeks and <= 3 drinks/occasion for females) and that the risk for alcohol disorders and other health effects rises proportionally with the number of drinks per week and how often a drinker exceeds daily limits.  Discussed cessation/primary prevention of drug use and availability of treatment for abuse.   Diet: Encouraged to adjust caloric intake to maintain  or achieve ideal body weight, to reduce intake of dietary saturated fat and total fat, to limit sodium intake by avoiding high sodium foods and not adding table salt, and to maintain adequate dietary potassium  and calcium preferably from fresh fruits, vegetables, and low-fat dairy products.    stressed the importance of regular exercise  Injury prevention: Discussed safety belts, safety helmets, smoke detector, smoking near bedding or upholstery.   Dental health: Discussed importance of regular tooth brushing, flossing, and dental visits.    NEXT PREVENTATIVE PHYSICAL DUE IN 1 YEAR. Return in about 1 year (around 09/11/2024) for Physical and Fasting labs.

## 2023-09-12 NOTE — Progress Notes (Signed)
 Appointment has been made

## 2023-09-12 NOTE — Telephone Encounter (Signed)
 Ok for E2C2 to review.  Appt was made to early and should have been made for October 2025. Please assist in rescheduling if she would like.

## 2023-09-12 NOTE — Assessment & Plan Note (Signed)
Followed by Oncology

## 2023-09-12 NOTE — Assessment & Plan Note (Signed)
 Chronic.  Controlled.  Continue with current medication regimen of Metoprolol  25mg  PRN.  Hasn't had any AFIB symptoms since December.  Followed by Cardiology- recent note reviewed. Labs ordered today.  Return to clinic in 6 months for reevaluation.  Call sooner if concerns arise.

## 2023-09-13 ENCOUNTER — Ambulatory Visit: Payer: Self-pay | Admitting: Nurse Practitioner

## 2023-09-13 LAB — CBC WITH DIFFERENTIAL/PLATELET
Basophils Absolute: 0.1 10*3/uL (ref 0.0–0.2)
Basos: 1 %
EOS (ABSOLUTE): 0.2 10*3/uL (ref 0.0–0.4)
Eos: 3 %
Hematocrit: 44.8 % (ref 34.0–46.6)
Hemoglobin: 14.5 g/dL (ref 11.1–15.9)
Immature Grans (Abs): 0 10*3/uL (ref 0.0–0.1)
Immature Granulocytes: 0 %
Lymphocytes Absolute: 1.9 10*3/uL (ref 0.7–3.1)
Lymphs: 29 %
MCH: 29.5 pg (ref 26.6–33.0)
MCHC: 32.4 g/dL (ref 31.5–35.7)
MCV: 91 fL (ref 79–97)
Monocytes Absolute: 0.5 10*3/uL (ref 0.1–0.9)
Monocytes: 8 %
Neutrophils Absolute: 3.9 10*3/uL (ref 1.4–7.0)
Neutrophils: 59 %
Platelets: 259 10*3/uL (ref 150–450)
RBC: 4.92 x10E6/uL (ref 3.77–5.28)
RDW: 13 % (ref 11.7–15.4)
WBC: 6.5 10*3/uL (ref 3.4–10.8)

## 2023-09-13 LAB — COMPREHENSIVE METABOLIC PANEL WITH GFR
ALT: 17 IU/L (ref 0–32)
AST: 21 IU/L (ref 0–40)
Albumin: 4.6 g/dL (ref 3.8–4.8)
Alkaline Phosphatase: 62 IU/L (ref 44–121)
BUN/Creatinine Ratio: 30 — ABNORMAL HIGH (ref 12–28)
BUN: 22 mg/dL (ref 8–27)
Bilirubin Total: 0.4 mg/dL (ref 0.0–1.2)
CO2: 24 mmol/L (ref 20–29)
Calcium: 9.6 mg/dL (ref 8.7–10.3)
Chloride: 102 mmol/L (ref 96–106)
Creatinine, Ser: 0.74 mg/dL (ref 0.57–1.00)
Globulin, Total: 2.6 g/dL (ref 1.5–4.5)
Glucose: 83 mg/dL (ref 70–99)
Potassium: 4.7 mmol/L (ref 3.5–5.2)
Sodium: 141 mmol/L (ref 134–144)
Total Protein: 7.2 g/dL (ref 6.0–8.5)
eGFR: 85 mL/min/{1.73_m2} (ref >59)

## 2023-09-13 LAB — LIPID PANEL
Chol/HDL Ratio: 2.6 ratio (ref 0.0–4.4)
Cholesterol, Total: 221 mg/dL — ABNORMAL HIGH (ref 100–199)
HDL: 85 mg/dL (ref >39)
LDL Chol Calc (NIH): 125 mg/dL — ABNORMAL HIGH (ref 0–99)
Triglycerides: 61 mg/dL (ref 0–149)
VLDL Cholesterol Cal: 11 mg/dL (ref 5–40)

## 2023-09-13 LAB — TSH: TSH: 2.25 u[IU]/mL (ref 0.450–4.500)

## 2023-09-18 ENCOUNTER — Ambulatory Visit: Attending: Internal Medicine

## 2023-09-18 DIAGNOSIS — R0609 Other forms of dyspnea: Secondary | ICD-10-CM | POA: Diagnosis not present

## 2023-09-18 DIAGNOSIS — I081 Rheumatic disorders of both mitral and tricuspid valves: Secondary | ICD-10-CM | POA: Diagnosis not present

## 2023-09-18 DIAGNOSIS — R011 Cardiac murmur, unspecified: Secondary | ICD-10-CM

## 2023-09-18 DIAGNOSIS — I5030 Unspecified diastolic (congestive) heart failure: Secondary | ICD-10-CM

## 2023-09-18 LAB — ECHOCARDIOGRAM COMPLETE
AV Mean grad: 4 mmHg
AV Peak grad: 7.6 mmHg
Ao pk vel: 1.38 m/s
Area-P 1/2: 3.21 cm2
S' Lateral: 2.73 cm

## 2023-09-20 ENCOUNTER — Ambulatory Visit: Payer: Self-pay | Admitting: Internal Medicine

## 2023-10-31 ENCOUNTER — Ambulatory Visit
Admission: RE | Admit: 2023-10-31 | Discharge: 2023-10-31 | Disposition: A | Discharge status: Home / Self Care | Source: Ambulatory Visit | Attending: Nurse Practitioner | Admitting: Nurse Practitioner

## 2023-10-31 DIAGNOSIS — Z780 Asymptomatic menopausal state: Secondary | ICD-10-CM | POA: Diagnosis present

## 2023-11-01 ENCOUNTER — Ambulatory Visit: Payer: Self-pay | Admitting: Nurse Practitioner

## 2023-11-01 ENCOUNTER — Other Ambulatory Visit

## 2023-11-05 MED ORDER — ALENDRONATE SODIUM 70 MG PO TABS
70.0000 mg | ORAL_TABLET | ORAL | 3 refills | Status: AC
Start: 2023-11-05 — End: ?

## 2023-12-03 ENCOUNTER — Other Ambulatory Visit (INDEPENDENT_AMBULATORY_CARE_PROVIDER_SITE_OTHER): Payer: Self-pay | Admitting: Nurse Practitioner

## 2023-12-03 DIAGNOSIS — I83813 Varicose veins of bilateral lower extremities with pain: Secondary | ICD-10-CM

## 2023-12-04 ENCOUNTER — Encounter (INDEPENDENT_AMBULATORY_CARE_PROVIDER_SITE_OTHER): Payer: Self-pay | Admitting: Vascular Surgery

## 2023-12-04 ENCOUNTER — Ambulatory Visit (INDEPENDENT_AMBULATORY_CARE_PROVIDER_SITE_OTHER): Admitting: Vascular Surgery

## 2023-12-04 ENCOUNTER — Ambulatory Visit (INDEPENDENT_AMBULATORY_CARE_PROVIDER_SITE_OTHER)

## 2023-12-04 VITALS — BP 123/67 | HR 52 | Resp 18 | Ht 64.0 in | Wt 168.2 lb

## 2023-12-04 DIAGNOSIS — I83813 Varicose veins of bilateral lower extremities with pain: Secondary | ICD-10-CM

## 2023-12-04 DIAGNOSIS — M793 Panniculitis, unspecified: Secondary | ICD-10-CM | POA: Diagnosis not present

## 2023-12-04 DIAGNOSIS — E782 Mixed hyperlipidemia: Secondary | ICD-10-CM | POA: Diagnosis not present

## 2023-12-04 NOTE — Progress Notes (Signed)
 Patient ID: Shirley Lowe, female   DOB: April 16, 1951, 73 y.o.   MRN: [id number]  Chief Complaint  Patient presents with   Establish Care    New Patient, LE reflux + consult  VV bot LE w/pain ref.stewart    HPI Shirley Lowe is a 73 y.o. female.  I am asked to see the patient by Dr. Jackquline for evaluation of lower extremity discoloration, swelling, and prominent varicosities.  She has had varicose veins for many years.  These have gradually increased over time.  No antecedent history of deep venous thrombosis or superficial thrombophlebitis to her knowledge.  She has worn compression socks as well as elevated her legs and exercised for years to try to improve things.  In general, the conservative measures have helped her symptoms some, the symptoms have continued to progress and now are more bothersome to her.  The discoloration is also becoming much more noticeable.  She denies any open wounds or infection.  No fevers or chills.  No chest pain or shortness of breath.  A venous reflux study was performed today which showed significant venous reflux in both great saphenous veins throughout including the saphenofemoral junction.  The left small saphenous vein also demonstrated reflux.  No DVT or superficial thrombophlebitis was identified.     Past Medical History:  Diagnosis Date   Arrhythmia [id number]   Breast cancer [facility])    Left DCI breast cancer   Cancer [facility])    Cataract 2019   Removed 2023   Diverticulitis    Diverticulitis    Dyspnea    with exerton due to out of shape per pt    Heart murmur 08/15/23   Dr End   Hyperlipidemia 2016   Osteoporosis    Ulcerative colitis     Past Surgical History:  Procedure Laterality Date   ABDOMINAL HYSTERECTOMY     BREAST BIOPSY Left 12/01/2021   Stereo bx, X-clip, path pending   CATARACT EXTRACTION W/PHACO Right 09/13/2021   Procedure: CATARACT EXTRACTION PHACO AND INTRAOCULAR LENS PLACEMENT (IOC) RIGHT RAYNOR LENS;  Surgeon:  Jaye Fallow, MD;  Location: [facility];  Service: Ophthalmology;  Laterality: Right;  5.32 00:40.7   CATARACT EXTRACTION W/PHACO Left 09/27/2021   Procedure: CATARACT EXTRACTION PHACO AND INTRAOCULAR LENS PLACEMENT (IOC) LEFT RAYNOR LENS;  Surgeon: Jaye Fallow, MD;  Location: [facility];  Service: Ophthalmology;  Laterality: Left;  4.79 0:38.5   COLON RESECTION SIGMOID N/A    COLON SURGERY  09/22/20   4 1/2" Sigmoid colon removed   COLONOSCOPY WITH PROPOFOL  N/A 11/12/2019   Procedure: COLONOSCOPY WITH PROPOFOL ;  Surgeon: Janalyn Keene NOVAK, MD;  Location: [facility] ENDOSCOPY;  Service: Endoscopy;  Laterality: N/A;   EYE SURGERY  08/2021   Cataracts removed   INCONTINENCE SURGERY     TONSILLECTOMY     TONSILLECTOMY       Family History  Problem Relation Age of Onset   Cancer Mother        breast cancer   Breast cancer Mother 75       metastic   Varicose Veins Mother    Cancer Father        pancreatic   Hepatitis C Sister    Thyroid  disease Sister    Hearing loss Sister    Irritable bowel syndrome Sister    Arrhythmia Sister    Hearing loss Sister    Allergies Sister    Hearing loss Sister  Social History   Tobacco Use   Smoking status: Never    Passive exposure: Past   Smokeless tobacco: Never   Tobacco comments:    smoking cessation materials not required  Vaping Use   Vaping status: Never Used  Substance Use Topics   Alcohol use: Never   Drug use: Never     Allergies  Allergen Reactions   Penicillins     Has yeast infection    Current Outpatient Medications  Medication Sig Dispense Refill   acetaminophen  (TYLENOL ) 500 MG tablet Take 2 tablets (1,000 mg total) by mouth every 6 (six) hours. 30 tablet 0   alendronate  (FOSAMAX ) 70 MG tablet Take 1 tablet (70 mg total) by mouth every 7 (seven) days. Take with a full glass of water  on an empty stomach. 12 tablet 3   ascorbic acid (VITAMIN C) 1000 MG tablet Take 1,000 mg by  mouth daily.     B Complex Vitamins (VITAMIN B-COMPLEX) TABS Take 1 tablet by mouth daily.     Calcium Carbonate (CALTRATE 600 PO) Take 1 tablet by mouth daily.     CITRUS BERGAMOT PO Take 1 tablet by mouth daily.     [employer] CRANBERRY 500 MG CAPS      Garlic 500 MG TABS Take 1 tablet by mouth daily.     Green Tea, Camellia sinensis, (GREEN TEA EXTRACT PO) Take 1 tablet by mouth daily.     Magnesium 125 MG CAPS Take 125 mg by mouth daily.     mesalamine  (LIALDA ) 1.2 g EC tablet TAKE 1 TABLET BY MOUTH IN THE MORNING AND IN THE EVENING 180 tablet 3   metoprolol  tartrate (LOPRESSOR ) 25 MG tablet TAKE 1 TABLET BY MOUTH DAILY AS NEEDED. 90 tablet 3   Misc Natural Products (ADV TURMERIC CURCUMIN COMPLEX PO) Take 1 capsule by mouth daily.     Multiple Vitamins-Minerals (SUPER D-ZINC-SELENIUM-COPPER PO) Take 1 tablet by mouth daily.     Omega-3 Fatty Acids (FISH OIL) 1000 MG CAPS Take 1,000 mg by mouth daily.     PUMPKIN SEED PO Take 1 tablet by mouth daily.     tacrolimus  (PROTOPIC ) 0.1 % ointment Apply to affected area rash on leg twice daily as needed. 60 g 3   Taurine 1000 MG CAPS Take 1 capsule by mouth daily.     triamcinolone (KENALOG) 0.025 % cream Apply 1 Application topically daily. (Patient taking differently: Apply 1 Application topically as needed (not for extended use).)     VITAMIN D -VITAMIN K PO Take 1 tablet by mouth daily.     vitamin E 100 UNIT capsule Take 100 Units by mouth daily.     zinc gluconate 50 MG tablet Take 50 mg by mouth daily.     No current facility-administered medications for this visit.      REVIEW OF SYSTEMS (Negative unless checked)  Constitutional: [] Weight loss  [] Fever  [] Chills Cardiac: [] Chest pain   [] Chest pressure   [x] Palpitations   [] Shortness of breath when laying flat   [] Shortness of breath at rest   [] Shortness of breath with exertion. Vascular:  [x] Pain in legs with walking   [x] Pain in legs at rest   [] Pain in legs when laying flat    [] Claudication   [] Pain in feet when walking  [] Pain in feet at rest  [] Pain in feet when laying flat   [] History of DVT   [] Phlebitis   [x] Swelling in legs   [] Varicose veins   [] Non-healing ulcers Pulmonary:   []   Uses home oxygen   [] Productive cough   [] Hemoptysis   [] Wheeze  [] COPD   [] Asthma Neurologic:  [] Dizziness  [] Blackouts   [] Seizures   [] History of stroke   [] History of TIA  [] Aphasia   [] Temporary blindness   [] Dysphagia   [] Weakness or numbness in arms   [] Weakness or numbness in legs Musculoskeletal:  [] Arthritis   [] Joint swelling   [x] Joint pain   [] Low back pain Hematologic:  [] Easy bruising  [] Easy bleeding   [] Hypercoagulable state   [] Anemic  [] Hepatitis Gastrointestinal:  [] Blood in stool   [] Vomiting blood  [] Gastroesophageal reflux/heartburn   [] Abdominal pain Genitourinary:  [] Chronic kidney disease   [] Difficult urination  [] Frequent urination  [] Burning with urination   [] Hematuria Skin:  [] Rashes   [] Ulcers   [] Wounds Psychological:  [] History of anxiety   []  History of major depression.    Physical Exam BP 123/67 (BP Location: Right Arm, Patient Position: Sitting, Cuff Size: Normal)   Pulse (!) 52   Resp 18   Ht 5' 4 (1.626 m)   Wt 168 lb 3.2 oz (76.3 kg)   BMI 28.87 kg/m  Gen:  WD/WN, NAD. Appears younger than stated age. Head: [place]/AT, No temporalis wasting. Ear/Nose/Throat: Hearing grossly intact, nares w/o erythema or drainage, oropharynx w/o Erythema/Exudate Eyes: Conjunctiva clear, sclera non-icteric  Neck: trachea midline.  No JVD.  Pulmonary:  Good air movement, respirations not labored, no use of accessory muscles  Cardiac: bradycardic Vascular:  Vessel Right Left  Radial Palpable Palpable                          DP 2+ 2+  PT 1+ 1+   Gastrointestinal:. No masses, surgical incisions, or scars. Musculoskeletal: M/S 5/5 throughout.  Extremities without ischemic changes.  No deformity or atrophy.  Moderate stasis dermatitis changes are present  bilaterally.  Hyperpigmentation and skin thickening is present.  Diffuse varicosities are present throughout both lower extremities.  1+ bilateral lower extremity edema. Neurologic: Sensation grossly intact in extremities.  Symmetrical.  Speech is fluent. Motor exam as listed above. Psychiatric: Judgment intact, Mood & affect appropriate for pt's clinical situation. Dermatologic: No rashes or ulcers noted.  No cellulitis or open wounds.    Radiology No results found.  Labs Recent Results (from the past 2160 hours)  CBC with Differential/Platelet     Status: None   Collection Time: 09/12/23 11:29 AM  Result Value Ref Range   WBC 6.5 3.4 - 10.8 x10E3/uL   RBC 4.92 3.77 - 5.28 x10E6/uL   Hemoglobin 14.5 11.1 - 15.9 g/dL   Hematocrit 55.1 65.9 - 46.6 %   MCV 91 79 - 97 fL   MCH 29.5 26.6 - 33.0 pg   MCHC 32.4 31.5 - 35.7 g/dL   RDW 86.9 88.2 - 84.5 %   Platelets 259 150 - 450 x10E3/uL   Neutrophils 59 Not Estab. %   Lymphs 29 Not Estab. %   Monocytes 8 Not Estab. %   Eos 3 Not Estab. %   Basos 1 Not Estab. %   Neutrophils Absolute 3.9 1.4 - 7.0 x10E3/uL   Lymphocytes Absolute 1.9 0.7 - 3.1 x10E3/uL   Monocytes Absolute 0.5 0.1 - 0.9 x10E3/uL   EOS (ABSOLUTE) 0.2 0.0 - 0.4 x10E3/uL   Basophils Absolute 0.1 0.0 - 0.2 x10E3/uL   Immature Granulocytes 0 Not Estab. %   Immature Grans (Abs) 0.0 0.0 - 0.1 x10E3/uL  Comprehensive metabolic panel with GFR  Status: Abnormal   Collection Time: 09/12/23 11:29 AM  Result Value Ref Range   Glucose 83 70 - 99 mg/dL   BUN 22 8 - 27 mg/dL   Creatinine, Ser 9.25 0.57 - 1.00 mg/dL   eGFR 85 >40 fO/fpw/8.26   BUN/Creatinine Ratio 30 (H) 12 - 28   Sodium 141 134 - 144 mmol/L   Potassium 4.7 3.5 - 5.2 mmol/L   Chloride 102 96 - 106 mmol/L   CO2 24 20 - 29 mmol/L   Calcium 9.6 8.7 - 10.3 mg/dL   Total Protein 7.2 6.0 - 8.5 g/dL   Albumin 4.6 3.8 - 4.8 g/dL   Globulin, Total 2.6 1.5 - 4.5 g/dL   Bilirubin Total 0.4 0.0 - 1.2 mg/dL    Alkaline Phosphatase 62 44 - 121 IU/L   AST 21 0 - 40 IU/L   ALT 17 0 - 32 IU/L  Lipid panel     Status: Abnormal   Collection Time: 09/12/23 11:29 AM  Result Value Ref Range   Cholesterol, Total 221 (H) 100 - 199 mg/dL   Triglycerides 61 0 - 149 mg/dL   HDL 85 >60 mg/dL   VLDL Cholesterol Cal 11 5 - 40 mg/dL   LDL Chol Calc (NIH) 874 (H) 0 - 99 mg/dL   Chol/HDL Ratio 2.6 0.0 - 4.4 ratio    Comment:                                   T. Chol/HDL Ratio                                             Men  Women                               1/2 Avg.Risk  3.4    3.3                                   Avg.Risk  5.0    4.4                                2X Avg.Risk  9.6    7.1                                3X Avg.Risk 23.4   11.0   TSH     Status: None   Collection Time: 09/12/23 11:29 AM  Result Value Ref Range   TSH 2.250 0.450 - 4.500 uIU/mL  ECHOCARDIOGRAM COMPLETE     Status: None   Collection Time: 09/18/23 12:07 PM  Result Value Ref Range   AV Peak grad 7.6 mmHg   Ao pk vel 1.38 m/s   S' Lateral 2.73 cm   Area-P 1/2 3.21 cm2   AV Mean grad 4.0 mmHg   Est EF 60 - 65%     Assessment/Plan:  Varicose veins of leg with pain, bilateral A venous reflux study was performed today which showed significant venous reflux in both great saphenous veins throughout including the saphenofemoral junction.  The left small saphenous vein also demonstrated reflux.  No DVT or superficial thrombophlebitis was identified.   The patient has done appropriate conservative therapy now for many years. She has CEAP class 4 venous insufficiency. Despite this, her symptoms continue to progress.  I have discussed venous insufficiency including the pathophysiology and treatment options.  At this point, it would certainly be reasonable to consider laser ablation of the great saphenous vein bilaterally in a staged fashion.  I discussed the risks and benefits of the procedure.  Patient voices her understanding and  would like to consider options but is leaning towards proceeding.  We will begin the preapproval process.  Lipodermatosclerosis Likely exacerbated by her venous insufficiency.  Hyperlipidemia lipid control important in reducing the progression of atherosclerotic disease.        Selinda Gu 12/04/2023, 5:19 PM   This note was created with Dragon medical transcription system.  Any errors from dictation are unintentional.

## 2023-12-04 NOTE — Assessment & Plan Note (Signed)
 Likely exacerbated by her venous insufficiency.

## 2023-12-04 NOTE — Assessment & Plan Note (Signed)
lipid control important in reducing the progression of atherosclerotic disease.   

## 2023-12-04 NOTE — Assessment & Plan Note (Addendum)
 A venous reflux study was performed today which showed significant venous reflux in both great saphenous veins throughout including the saphenofemoral junction.  The left small saphenous vein also demonstrated reflux.  No DVT or superficial thrombophlebitis was identified.   The patient has done appropriate conservative therapy now for many years. She has CEAP class 4 venous insufficiency. Despite this, her symptoms continue to progress.  I have discussed venous insufficiency including the pathophysiology and treatment options.  At this point, it would certainly be reasonable to consider laser ablation of the great saphenous vein bilaterally in a staged fashion.  I discussed the risks and benefits of the procedure.  Patient voices her understanding and would like to consider options but is leaning towards proceeding.  We will begin the preapproval process.

## 2023-12-25 ENCOUNTER — Telehealth (INDEPENDENT_AMBULATORY_CARE_PROVIDER_SITE_OTHER): Payer: Self-pay | Admitting: Vascular Surgery

## 2023-12-25 NOTE — Telephone Encounter (Signed)
 Spoke with pt. She states that she has still not made up her mind if she wants to do the laser ablation. She will call back if/when she is ready. I advised that they would need to be 4 weeks apart and the expiration date is 2.26.26. Pt acknowledged.   right leg GSV laser. see jd. auth # [id number] exp: 9.22.25 - 2.26.26   1 week post right leg GSV laser   left leg GSV laser. see jd. auth # [id number] exp: 9.22.25 - 2.26.26   1 week post left leg GSV laser   4 and 8 week bilateral GSV lasers. see jd/fb

## 2024-01-30 NOTE — Progress Notes (Unsigned)
 Electrophysiology Clinic Note    Date:  01/31/2024  Patient ID:  Shirley Lowe, Shirley Lowe [birth year], MRN [id number] PCP:  Melvin Pao, NP  Cardiologist:  Lonni Hanson, MD  Electrophysiologist:  Fonda Kitty, MD  Electrophysiology APP:  Imogine Carvell, NP     Discussed the use of AI scribe software for clinical note transcription with the patient, who gave verbal consent to proceed.   Patient Profile    Chief Complaint: AFib follow-up  History of Present Illness: Shirley Lowe is a 73 y.o. female with PMH notable for parox AFib, UC, breast cancer; seen today for Fonda Kitty, MD for routine electrophysiology followup.   She last saw Dr. Kitty 07/2023 to discuss treatment options of AFib. Recent coronary CTA without CAD. They discussed initiating flecainide and patient wanted to think about options. Agreed to start PRN metop for episodes. Eliquis  adjusted to xarelto .   On follow-up today, she has had a particularly bad week with palpitation episodes.  Over the weekend, each day had an hour-long + episode requiring PRN metop that improved her symptoms. During these longer episodes, she has chest discomfort, anxiety, and uncomfortably feeling Each day this week she has had very brief palpitation episodes. This is very abnormal for her, she has not had any afib since Dec 2024. Of note, her AFib was initially diagnosed this time last year, so she questions whether there is an environmental cause of her episodes.  She uses apple watch to monitor her heart rhythm.   She did not tolerate xarelto , had same joint pain as with eliquis  and stopped.   She has lymphedema with chronic lower extremity swelling. Today's swelling is her baseline.    Arrhythmia/Device History No specialty comments available.    ROS:  Please see the history of present illness. All other systems are reviewed and otherwise negative.    Physical Exam    VS:  BP 110/70   Pulse 66   Ht 5' 4  (1.626 m)   Wt 168 lb (76.2 kg)   SpO2 97%   BMI 28.84 kg/m  BMI: Body mass index is 28.84 kg/m.           Wt Readings from Last 3 Encounters:  01/31/24 168 lb (76.2 kg)  12/04/23 168 lb 3.2 oz (76.3 kg)  09/12/23 166 lb 3.2 oz (75.4 kg)     GEN- The patient is well appearing, alert and oriented x 3 today.   Lungs- Clear to ausculation bilaterally, normal work of breathing.  Heart- Regular rate and rhythm, no murmurs, rubs or gallops Extremities- 1+ peripheral edema, warm, dry   Studies Reviewed   Previous EP, cardiology notes.    EKG is ordered. Personal review of EKG from today shows:    EKG Interpretation Date/Time:  Thursday January 31 2024 10:32:26 EDT Ventricular Rate:  66 PR Interval:  148 QRS Duration:  78 QT Interval:  384 QTC Calculation: 402 R Axis:   6  Text Interpretation: Normal sinus rhythm Possible Left atrial enlargement Nonspecific ST and T wave abnormality Confirmed by Carrera Kiesel [phone]) on 01/31/2024 10:36:30 AM     TTE, 5/202/2025  1. Left ventricular ejection fraction, by estimation, is 60 to 65%. The left ventricle has normal function. The left ventricle has no regional wall motion abnormalities. Left ventricular diastolic parameters are consistent with Grade I diastolic dysfunction (impaired relaxation). The average left ventricular global longitudinal strain is -21.6 %. The global longitudinal strain is normal.   2. Right  ventricular systolic function is normal. The right ventricular size is normal. There is normal pulmonary artery systolic pressure. The estimated right ventricular systolic pressure is 28.2 mmHg.   3. The mitral valve is normal in structure. Mild mitral valve regurgitation. No evidence of mitral stenosis.   4. The aortic valve is tricuspid. Aortic valve regurgitation is not visualized. No aortic stenosis is present. Aortic valve mean gradient measures 4.0 mmHg.   5. The inferior vena cava is normal in size with greater than 50%  respiratory variability, suggesting right atrial pressure of 3 mmHg.   Coronary CTA, 06/28/2023 1. Coronary calcium score of 0.  2. Normal coronary origin with right dominance.  3. No evidence of CAD.  4. CAD-RADS 0. Consider non-atherosclerotic causes of chest pain.  Long term monitor, 04/19/2023 HR 46 - 193, average 68 bpm. 56 nonsustained SVT, longest 10.2 seconds with an average rate of 135 bpm. <1% burden of atrial fibrillation, average rate 122 bpm. Longest episode lasting 3 hours. Rare supraventricular and ventricular ectopy. Symptom trigger episodes correspond to atrial fibrillation.   Assessment and Plan     #) parox AFib #) palpitations Has several months without palpitation episodes, recently increased burden in the past week She remains very hesitant to escalate AF treatment given overall low burden Recommend she continue to monitor burden, and notify us  if episodes increase in frequency or duration Continue 25mg  lopressor  PRN  #) Hypercoag d/t afib CHA2DS2-VASc Score = at least 2 [CHF History: 0, HTN History: 0, Diabetes History: 0, Stroke History: 0, Vascular Disease History: 0, Age Score: 1, Gender Score: 1].  Therefore, the patient's annual risk of stroke is 2.2 %.    Intolerance to both eliquis  and xarelto . Given low AF burden, she is not interested in trialing pradaxa or warfarin which I think is reasonable given low chadsvasc score       Current medicines are reviewed at length with the patient today.   The patient does not have concerns regarding her medicines.  The following changes were made today:  none  Labs/ tests ordered today include:  Orders Placed This Encounter  Procedures   EKG 12-Lead     Disposition: Follow up with Dr. Kennyth or EP APP in 6 months   Signed, Levii Hairfield, NP  01/31/24  4:44 PM  Electrophysiology [facility]

## 2024-01-31 ENCOUNTER — Encounter: Payer: Self-pay | Admitting: Cardiology

## 2024-01-31 ENCOUNTER — Ambulatory Visit: Attending: Cardiology | Admitting: Cardiology

## 2024-01-31 VITALS — BP 110/70 | HR 66 | Ht 64.0 in | Wt 168.0 lb

## 2024-01-31 DIAGNOSIS — I480 Paroxysmal atrial fibrillation: Secondary | ICD-10-CM

## 2024-01-31 DIAGNOSIS — D6869 Other thrombophilia: Secondary | ICD-10-CM

## 2024-01-31 NOTE — Patient Instructions (Signed)
 Medication Instructions:  Your physician recommends that you continue on your current medications as directed. Please refer to the Current Medication list given to you today.   *If you need a refill on your cardiac medications before your next appointment, please call your pharmacy*  Lab Work: None ordered at this time  If you have labs (blood work) drawn today and your tests are completely normal, you will receive your results only by: MyChart Message (if you have MyChart) OR A paper copy in the mail If you have any lab test that is abnormal or we need to change your treatment, we will call you to review the results.  Testing/Procedures: None ordered at this time   Follow-Up: At [facility], you and your health needs are our priority.  As part of our continuing mission to provide you with exceptional heart care, our providers are all part of one team.  This team includes your primary Cardiologist (physician) and Advanced Practice Providers or APPs (Physician Assistants and Nurse Practitioners) who all work together to provide you with the care you need, when you need it.  Your next appointment:   6 month(s)  Provider:   Suzann Riddle, NP or Sidra Kitty, MD

## 2024-09-04 ENCOUNTER — Ambulatory Visit

## 2024-09-09 ENCOUNTER — Encounter: Admitting: Dermatology

## 2024-09-15 ENCOUNTER — Encounter: Admitting: Nurse Practitioner
